# Patient Record
Sex: Female | Born: 1955 | Race: White | Hispanic: No | Marital: Married | Smoking: Never smoker
Health system: Southern US, Community
[De-identification: ages and names within clinical notes are randomized; demographics above are authoritative.]

## PROBLEM LIST (undated history)

## (undated) DIAGNOSIS — I10 Essential (primary) hypertension: Secondary | ICD-10-CM

## (undated) DIAGNOSIS — Z923 Personal history of irradiation: Secondary | ICD-10-CM

## (undated) DIAGNOSIS — B019 Varicella without complication: Secondary | ICD-10-CM

## (undated) DIAGNOSIS — K589 Irritable bowel syndrome without diarrhea: Secondary | ICD-10-CM

## (undated) DIAGNOSIS — C50212 Malignant neoplasm of upper-inner quadrant of left female breast: Principal | ICD-10-CM

## (undated) DIAGNOSIS — Z8719 Personal history of other diseases of the digestive system: Secondary | ICD-10-CM

## (undated) DIAGNOSIS — Z9221 Personal history of antineoplastic chemotherapy: Secondary | ICD-10-CM

## (undated) DIAGNOSIS — C50919 Malignant neoplasm of unspecified site of unspecified female breast: Secondary | ICD-10-CM

## (undated) DIAGNOSIS — E785 Hyperlipidemia, unspecified: Secondary | ICD-10-CM

## (undated) HISTORY — DX: Essential (primary) hypertension: I10

## (undated) HISTORY — DX: Malignant neoplasm of upper-inner quadrant of left female breast: C50.212

## (undated) HISTORY — DX: Varicella without complication: B01.9

## (undated) HISTORY — DX: Irritable bowel syndrome, unspecified: K58.9

## (undated) HISTORY — DX: Hyperlipidemia, unspecified: E78.5

---

## 1989-10-04 HISTORY — PX: LASER ABLATION OF THE CERVIX: SHX1949

## 2005-06-18 HISTORY — PX: COLONOSCOPY: SHX174

## 2005-09-12 LAB — HM COLONOSCOPY

## 2007-11-21 ENCOUNTER — Encounter: Admission: RE | Admit: 2007-11-21 | Discharge: 2007-11-21 | Payer: Self-pay | Admitting: Family Medicine

## 2007-12-08 ENCOUNTER — Encounter: Admission: RE | Admit: 2007-12-08 | Discharge: 2007-12-08 | Payer: Self-pay | Admitting: Family Medicine

## 2009-03-11 ENCOUNTER — Encounter: Admission: RE | Admit: 2009-03-11 | Discharge: 2009-03-11 | Payer: Self-pay | Admitting: Family Medicine

## 2009-03-14 ENCOUNTER — Encounter: Admission: RE | Admit: 2009-03-14 | Discharge: 2009-03-14 | Payer: Self-pay | Admitting: Family Medicine

## 2009-03-19 ENCOUNTER — Encounter: Admission: RE | Admit: 2009-03-19 | Discharge: 2009-03-19 | Payer: Self-pay | Admitting: Family Medicine

## 2009-03-19 HISTORY — PX: BREAST BIOPSY: SHX20

## 2010-07-06 ENCOUNTER — Encounter: Admission: RE | Admit: 2010-07-06 | Discharge: 2010-07-06 | Payer: Self-pay | Admitting: Family Medicine

## 2010-07-27 ENCOUNTER — Encounter: Admission: RE | Admit: 2010-07-27 | Discharge: 2010-07-27 | Payer: Self-pay | Admitting: Family Medicine

## 2010-10-24 ENCOUNTER — Encounter: Payer: Self-pay | Admitting: Family Medicine

## 2012-02-18 ENCOUNTER — Other Ambulatory Visit: Payer: Self-pay | Admitting: Physician Assistant

## 2012-02-18 ENCOUNTER — Other Ambulatory Visit: Payer: Self-pay | Admitting: Family Medicine

## 2012-02-18 DIAGNOSIS — Z1231 Encounter for screening mammogram for malignant neoplasm of breast: Secondary | ICD-10-CM

## 2012-03-15 ENCOUNTER — Ambulatory Visit
Admission: RE | Admit: 2012-03-15 | Discharge: 2012-03-15 | Disposition: A | Discharge status: Home / Self Care | Payer: BC Managed Care – PPO | Source: Ambulatory Visit | Attending: Physician Assistant | Admitting: Physician Assistant

## 2012-03-15 DIAGNOSIS — Z1231 Encounter for screening mammogram for malignant neoplasm of breast: Secondary | ICD-10-CM

## 2013-03-27 ENCOUNTER — Encounter: Payer: Self-pay | Admitting: Family Medicine

## 2013-03-27 ENCOUNTER — Ambulatory Visit (INDEPENDENT_AMBULATORY_CARE_PROVIDER_SITE_OTHER): Payer: Self-pay | Admitting: Family Medicine

## 2013-03-27 VITALS — BP 118/84 | HR 63 | Temp 98.3°F | Ht 64.75 in | Wt 178.6 lb

## 2013-03-27 DIAGNOSIS — E785 Hyperlipidemia, unspecified: Secondary | ICD-10-CM

## 2013-03-27 DIAGNOSIS — I10 Essential (primary) hypertension: Secondary | ICD-10-CM | POA: Insufficient documentation

## 2013-03-27 DIAGNOSIS — K589 Irritable bowel syndrome without diarrhea: Secondary | ICD-10-CM

## 2013-03-27 LAB — CBC WITH DIFFERENTIAL/PLATELET
Basophils Absolute: 0 10*3/uL (ref 0.0–0.1)
Basophils Relative: 0.8 % (ref 0.0–3.0)
Eosinophils Absolute: 0.1 10*3/uL (ref 0.0–0.7)
HCT: 40 % (ref 36.0–46.0)
Hemoglobin: 13.7 g/dL (ref 12.0–15.0)
Lymphs Abs: 1.5 10*3/uL (ref 0.7–4.0)
MCHC: 34.3 g/dL (ref 30.0–36.0)
Monocytes Relative: 6.8 % (ref 3.0–12.0)
Neutro Abs: 2.3 10*3/uL (ref 1.4–7.7)
RBC: 4.41 Mil/uL (ref 3.87–5.11)
RDW: 13.3 % (ref 11.5–14.6)

## 2013-03-27 LAB — LIPID PANEL
Cholesterol: 299 mg/dL — ABNORMAL HIGH (ref 0–200)
Total CHOL/HDL Ratio: 5
Triglycerides: 160 mg/dL — ABNORMAL HIGH (ref 0.0–149.0)

## 2013-03-27 LAB — TSH: TSH: 0.87 u[IU]/mL (ref 0.35–5.50)

## 2013-03-27 LAB — HEPATIC FUNCTION PANEL
ALT: 17 U/L (ref 0–35)
Bilirubin, Direct: 0.1 mg/dL (ref 0.0–0.3)
Total Protein: 7.5 g/dL (ref 6.0–8.3)

## 2013-03-27 LAB — BASIC METABOLIC PANEL
BUN: 14 mg/dL (ref 6–23)
CO2: 28 mEq/L (ref 19–32)
Calcium: 9.4 mg/dL (ref 8.4–10.5)
Creatinine, Ser: 0.7 mg/dL (ref 0.4–1.2)

## 2013-03-27 MED ORDER — DICYCLOMINE HCL 20 MG PO TABS: 20.0000 mg | ORAL_TABLET | Freq: Four times a day (QID) | ORAL | Status: DC

## 2013-03-27 NOTE — Assessment & Plan Note (Signed)
New to provider, chronic for pt.  BP elevated today but pt admits to being nervous for 1st visit.  Check labs.  Will continue to follow at future visits.

## 2013-03-27 NOTE — Patient Instructions (Addendum)
Schedule your complete physical in 6 months (we'll repeat labs at this time) We'll notify you of your lab results and make any changes if needed Start the Bentyl as needed for abdominal cramping and bloating Call with any questions or concerns Welcome!  We're glad to have you!

## 2013-03-27 NOTE — Progress Notes (Signed)
  Subjective:    Patient ID: Emily Griffith, female    DOB: [birth year], 57 y.o.   MRN: [id number]  HPI New to establish.  Previous MD- [facility], Elpidio Anis, PA-C  HTN- chronic problem, on Lisinopril HCTZ.  No CP, SOB, HAs, visual changes, edema.  Hyperlipidemia- chronic problem, previously on Pravastatin 80mg  but last took ~1 yr ago.  Exercising regularly.  IBS- chronic problem, dx'd at age 73.  Not currently on medication.  Pt feels sxs are fairly well controlled w/ diet and exercise.  sxs worsen w/ stress.  Diarrhea predominate.  + abd cramping and bloating.  Health Maintenance- UTD on pap, mammo.  Due for DEXA.  UTD on colonoscopy.   Review of Systems     Objective:   Physical Exam  Vitals reviewed. Constitutional: She is oriented to person, place, and time. She appears well-developed and well-nourished. No distress.  HENT:  Head: Normocephalic and atraumatic.  Eyes: Conjunctivae and EOM are normal. Pupils are equal, round, and reactive to light.  Neck: Normal range of motion. Neck supple. No thyromegaly present.  Cardiovascular: Normal rate, regular rhythm, normal heart sounds and intact distal pulses.   No murmur heard. Pulmonary/Chest: Effort normal and breath sounds normal. No respiratory distress.  Abdominal: Soft. She exhibits no distension. There is no tenderness.  Musculoskeletal: She exhibits no edema.  Lymphadenopathy:    She has no cervical adenopathy.  Neurological: She is alert and oriented to person, place, and time.  Skin: Skin is warm and dry.  Psychiatric: She has a normal mood and affect. Her behavior is normal.          Assessment & Plan:

## 2013-03-27 NOTE — Assessment & Plan Note (Signed)
New to provider, ongoing for pt.  Was on high dose pravastatin previously but has been off meds x1 yr.  Check labs.  Restart meds prn.  Pt expressed understanding and is in agreement w/ plan.

## 2013-03-27 NOTE — Assessment & Plan Note (Signed)
New to provider, ongoing for pt.  Reviewed lifestyle modifications- high fiber diet, increased water intake, regular exercise, stress outlet.  Will start bentyl prn.  immodium for diarrhea.

## 2013-04-10 ENCOUNTER — Other Ambulatory Visit: Payer: Self-pay | Admitting: *Deleted

## 2013-04-10 DIAGNOSIS — E785 Hyperlipidemia, unspecified: Secondary | ICD-10-CM

## 2013-04-10 MED ORDER — ATORVASTATIN CALCIUM 40 MG PO TABS: 40.0000 mg | ORAL_TABLET | Freq: Every day | ORAL | Status: DC

## 2013-04-24 ENCOUNTER — Telehealth: Payer: Self-pay | Admitting: *Deleted

## 2013-04-24 NOTE — Telephone Encounter (Signed)
Patient called to 04/24/13 in concern for her prescription that she had not received yet.  I called patient back and checked on her medication Bentyl and it has been at the [facility] since 03/27/13.

## 2013-05-29 ENCOUNTER — Encounter: Payer: Self-pay | Admitting: General Practice

## 2013-05-29 ENCOUNTER — Other Ambulatory Visit: Payer: Self-pay | Admitting: General Practice

## 2013-05-29 MED ORDER — LISINOPRIL-HYDROCHLOROTHIAZIDE 20-12.5 MG PO TABS: 1.0000 | ORAL_TABLET | Freq: Every day | ORAL | Status: DC

## 2013-07-11 ENCOUNTER — Other Ambulatory Visit: Payer: Self-pay

## 2013-07-11 DIAGNOSIS — Z1231 Encounter for screening mammogram for malignant neoplasm of breast: Secondary | ICD-10-CM

## 2013-08-13 ENCOUNTER — Ambulatory Visit
Admission: RE | Admit: 2013-08-13 | Discharge: 2013-08-13 | Disposition: A | Discharge status: Home / Self Care | Payer: Self-pay | Source: Ambulatory Visit

## 2013-08-13 DIAGNOSIS — Z1231 Encounter for screening mammogram for malignant neoplasm of breast: Secondary | ICD-10-CM

## 2013-08-14 ENCOUNTER — Other Ambulatory Visit: Payer: Self-pay | Admitting: Family Medicine

## 2013-08-14 NOTE — Telephone Encounter (Signed)
Med filled.  

## 2013-09-12 ENCOUNTER — Encounter: Payer: Self-pay | Admitting: Family Medicine

## 2013-09-12 ENCOUNTER — Ambulatory Visit (INDEPENDENT_AMBULATORY_CARE_PROVIDER_SITE_OTHER): Payer: BC Managed Care – PPO | Admitting: Family Medicine

## 2013-09-12 VITALS — BP 120/80 | HR 84 | Temp 97.7°F | Ht 64.5 in | Wt 177.2 lb

## 2013-09-12 DIAGNOSIS — Z0000 Encounter for general adult medical examination without abnormal findings: Secondary | ICD-10-CM | POA: Insufficient documentation

## 2013-09-12 MED ORDER — ATORVASTATIN CALCIUM 40 MG PO TABS: ORAL_TABLET | ORAL | Status: DC

## 2013-09-12 MED ORDER — LISINOPRIL-HYDROCHLOROTHIAZIDE 20-12.5 MG PO TABS: 1.0000 | ORAL_TABLET | Freq: Every day | ORAL | Status: DC

## 2013-09-12 NOTE — Progress Notes (Signed)
Pre visit review using our clinic review tool, if applicable. No additional management support is needed unless otherwise documented below in the visit note. 

## 2013-09-12 NOTE — Progress Notes (Signed)
   Subjective:    Patient ID: Emily Griffith, female    DOB: 02-27-1956, 57 y.o.   MRN: [id number]  HPI CPE- UTD on pap (2012), mammo (2014), colonoscopy (2006).  No concerns today.   Review of Systems Patient reports no vision/ hearing changes, adenopathy,fever, weight change,  persistant/recurrent hoarseness , swallowing issues, chest pain, palpitations, edema, persistant/recurrent cough, hemoptysis, dyspnea (rest/exertional/paroxysmal nocturnal), gastrointestinal bleeding (melena, rectal bleeding), abdominal pain, significant heartburn, bowel changes, GU symptoms (dysuria, hematuria, incontinence), Gyn symptoms (abnormal  bleeding, pain),  syncope, focal weakness, memory loss, numbness & tingling, skin/hair/nail changes, abnormal bruising or bleeding, anxiety, or depression.     Objective:   Physical Exam General Appearance:    Alert, cooperative, no distress, appears stated age  Head:    Normocephalic, without obvious abnormality, atraumatic  Eyes:    PERRL, conjunctiva/corneas clear, EOM's intact, fundi    benign, both eyes  Ears:    Normal TM's and external ear canals, both ears  Nose:   Nares normal, septum midline, mucosa normal, no drainage    or sinus tenderness  Throat:   Lips, mucosa, and tongue normal; teeth and gums normal  Neck:   Supple, symmetrical, trachea midline, no adenopathy;    Thyroid: no enlargement/tenderness/nodules  Back:     Symmetric, no curvature, ROM normal, no CVA tenderness  Lungs:     Clear to auscultation bilaterally, respirations unlabored  Chest Wall:    No tenderness or deformity   Heart:    Regular rate and rhythm, S1 and S2 normal, no murmur, rub   or gallop  Breast Exam:    Deferred to mammo  Abdomen:     Soft, non-tender, bowel sounds active all four quadrants,    no masses, no organomegaly  Genitalia:    Deferred to GYN  Rectal:    Extremities:   Extremities normal, atraumatic, no cyanosis or edema  Pulses:   2+ and symmetric all  extremities  Skin:   Skin color, texture, turgor normal, no rashes or lesions  Lymph nodes:   Cervical, supraclavicular, and axillary nodes normal  Neurologic:   CNII-XII intact, normal strength, sensation and reflexes    throughout          Assessment & Plan:

## 2013-09-12 NOTE — Patient Instructions (Signed)
Follow up in 6 months to recheck BP and cholesterol We'll notify you of your lab results and make any changes if needed Keep up the good work!  You look great!! Call with any questions or concerns Happy Holidays!!! 

## 2013-09-12 NOTE — Assessment & Plan Note (Signed)
Pt's PE WNL.  EKG done as baseline.  UTD on pap, mammo, colonoscopy.  Check labs.  Anticipatory guidance provided.

## 2013-09-13 LAB — CBC WITH DIFFERENTIAL/PLATELET
Basophils Absolute: 0 10*3/uL (ref 0.0–0.1)
Basophils Relative: 0.6 % (ref 0.0–3.0)
Eosinophils Absolute: 0.1 10*3/uL (ref 0.0–0.7)
Eosinophils Relative: 1.3 % (ref 0.0–5.0)
HCT: 41.7 % (ref 36.0–46.0)
Hemoglobin: 14.1 g/dL (ref 12.0–15.0)
MCV: 90.2 fl (ref 78.0–100.0)
Monocytes Relative: 8.1 % (ref 3.0–12.0)
Neutro Abs: 3.7 10*3/uL (ref 1.4–7.7)
Platelets: 315 10*3/uL (ref 150.0–400.0)
RBC: 4.63 Mil/uL (ref 3.87–5.11)
WBC: 6.5 10*3/uL (ref 4.5–10.5)

## 2013-09-13 LAB — LIPID PANEL
Cholesterol: 253 mg/dL — ABNORMAL HIGH (ref 0–200)
HDL: 59.1 mg/dL (ref >39.00)
Total CHOL/HDL Ratio: 4
Triglycerides: 75 mg/dL (ref 0.0–149.0)

## 2013-09-13 LAB — BASIC METABOLIC PANEL
Chloride: 97 mEq/L (ref 96–112)
Potassium: 3.1 mEq/L — ABNORMAL LOW (ref 3.5–5.1)

## 2013-09-13 LAB — HEPATIC FUNCTION PANEL
Alkaline Phosphatase: 55 U/L (ref 39–117)
Bilirubin, Direct: 0.2 mg/dL (ref 0.0–0.3)
Total Bilirubin: 1.2 mg/dL (ref 0.3–1.2)
Total Protein: 8 g/dL (ref 6.0–8.3)

## 2013-09-13 LAB — TSH: TSH: 0.66 u[IU]/mL (ref 0.35–5.50)

## 2013-09-13 LAB — LDL CHOLESTEROL, DIRECT: Direct LDL: 184 mg/dL

## 2013-09-14 ENCOUNTER — Other Ambulatory Visit: Payer: Self-pay | Admitting: General Practice

## 2013-09-14 MED ORDER — POTASSIUM CHLORIDE CRYS ER 20 MEQ PO TBCR: 20.0000 meq | EXTENDED_RELEASE_TABLET | Freq: Every day | ORAL | Status: DC

## 2013-09-15 LAB — VITAMIN D 1,25 DIHYDROXY: Vitamin D 1, 25 (OH)2 Total: 64 pg/mL (ref 18–72)

## 2013-09-17 ENCOUNTER — Encounter: Payer: Self-pay | Admitting: General Practice

## 2013-09-19 ENCOUNTER — Other Ambulatory Visit: Payer: Self-pay | Admitting: General Practice

## 2013-09-19 MED ORDER — ATORVASTATIN CALCIUM 40 MG PO TABS: ORAL_TABLET | ORAL | Status: DC

## 2013-09-19 MED ORDER — LISINOPRIL-HYDROCHLOROTHIAZIDE 20-12.5 MG PO TABS: 1.0000 | ORAL_TABLET | Freq: Every day | ORAL | Status: DC

## 2014-02-11 ENCOUNTER — Encounter: Payer: Self-pay | Admitting: Family Medicine

## 2014-02-11 ENCOUNTER — Ambulatory Visit (INDEPENDENT_AMBULATORY_CARE_PROVIDER_SITE_OTHER): Payer: BC Managed Care – PPO | Admitting: Family Medicine

## 2014-02-11 VITALS — BP 160/100 | HR 103 | Temp 98.2°F | Resp 16

## 2014-02-11 DIAGNOSIS — S93409A Sprain of unspecified ligament of unspecified ankle, initial encounter: Secondary | ICD-10-CM

## 2014-02-11 DIAGNOSIS — S93402A Sprain of unspecified ligament of left ankle, initial encounter: Secondary | ICD-10-CM | POA: Insufficient documentation

## 2014-02-11 NOTE — Patient Instructions (Signed)
Follow up as needed We'll notify you of your ortho appt Take the Hydrocodone as needed for pain ICE! Call with any questions or concerns Hang in there!!!

## 2014-02-11 NOTE — Progress Notes (Signed)
Pre visit review using our clinic review tool, if applicable. No additional management support is needed unless otherwise documented below in the visit note. 

## 2014-02-11 NOTE — Progress Notes (Signed)
   Subjective:    Patient ID: Emily Griffith, female    DOB: 04-Jun-1956, 58 y.o.   MRN: [id number]  HPI L ankle injury- occurred while golfing in [place] on 5/8 , rolled ankle when stepped in overgrown hole.  Pt had inversion injury.  Went to [facility] 5/10 and had xrays which showed no break.  Bought OTC ankle wrap on Saturday, started using crutches b/c she is unable to weight bear w/o significant pain.  Was given vicodin for pain.  + swelling and bruising of ankle laterally.   Review of Systems For ROS see HPI     Objective:   Physical Exam  Vitals reviewed. Constitutional: She appears well-developed and well-nourished. No distress.  Cardiovascular: Intact distal pulses.   Musculoskeletal: She exhibits edema (marked edema of L foot and ankle laterally) and tenderness (over L ATF, CFL, PTF).  Good dorsiflexion, pain w/ plantarflexion, pt unable to invert or evert w/o considerable pain.  Skin: Skin is warm and dry.          Assessment & Plan:

## 2014-02-11 NOTE — Assessment & Plan Note (Signed)
New.  Based on pt's severe tenderness over all 3 ligaments, suspect all 3 are torn.  Given severity of sprain, will refer to ortho for ongoing management and rehabilitation.  Pt to continue hydrocodone for pain.  No weight bearing at this time.  Will follow.

## 2014-03-13 ENCOUNTER — Ambulatory Visit: Payer: BC Managed Care – PPO | Admitting: Family Medicine

## 2014-03-18 ENCOUNTER — Ambulatory Visit (INDEPENDENT_AMBULATORY_CARE_PROVIDER_SITE_OTHER): Payer: BC Managed Care – PPO | Admitting: Family Medicine

## 2014-03-18 ENCOUNTER — Encounter: Payer: Self-pay | Admitting: Family Medicine

## 2014-03-18 VITALS — BP 126/82 | HR 87 | Temp 98.0°F | Resp 16 | Wt 176.0 lb

## 2014-03-18 DIAGNOSIS — E785 Hyperlipidemia, unspecified: Secondary | ICD-10-CM

## 2014-03-18 DIAGNOSIS — I10 Essential (primary) hypertension: Secondary | ICD-10-CM

## 2014-03-18 NOTE — Progress Notes (Signed)
   Subjective:    Patient ID: Emily Griffith, female    DOB: 1956/04/06, 58 y.o.   MRN: [id number]  HPI HTN- chronic problem, well controlled on Lisinopril HCTZ.  No CP, SOB, HAs, visual changes, edema.  Hyperlipidemia- chronic problem, on on Lipitor.  No abd pain, N/V, myalgias.   Review of Systems For ROS see HPI     Objective:   Physical Exam  Vitals reviewed. Constitutional: She is oriented to person, place, and time. She appears well-developed and well-nourished. No distress.  HENT:  Head: Normocephalic and atraumatic.  Eyes: Conjunctivae and EOM are normal. Pupils are equal, round, and reactive to light.  Neck: Normal range of motion. Neck supple. No thyromegaly present.  Cardiovascular: Normal rate, regular rhythm, normal heart sounds and intact distal pulses.   No murmur heard. Pulmonary/Chest: Effort normal and breath sounds normal. No respiratory distress.  Abdominal: Soft. She exhibits no distension. There is no tenderness.  Musculoskeletal: She exhibits no edema.  Lymphadenopathy:    She has no cervical adenopathy.  Neurological: She is alert and oriented to person, place, and time.  Skin: Skin is warm and dry.  Psychiatric: She has a normal mood and affect. Her behavior is normal.          Assessment & Plan:

## 2014-03-18 NOTE — Patient Instructions (Signed)
Schedule your complete physical in 6 months We'll notify you of your lab results and make any changes if needed Keep up the good work!  You look great!!! Good luck on the healing- hang in there!!!

## 2014-03-18 NOTE — Progress Notes (Signed)
Pre visit review using our clinic review tool, if applicable. No additional management support is needed unless otherwise documented below in the visit note. 

## 2014-03-19 ENCOUNTER — Telehealth: Payer: Self-pay | Admitting: Family Medicine

## 2014-03-19 ENCOUNTER — Encounter: Payer: Self-pay | Admitting: General Practice

## 2014-03-19 LAB — LIPID PANEL
CHOL/HDL RATIO: 4
Cholesterol: 199 mg/dL (ref 0–200)
HDL: 55.1 mg/dL (ref >39.00)
LDL Cholesterol: 114 mg/dL — ABNORMAL HIGH (ref 0–99)
NonHDL: 143.9
Triglycerides: 148 mg/dL (ref 0.0–149.0)
VLDL: 29.6 mg/dL (ref 0.0–40.0)

## 2014-03-19 LAB — BASIC METABOLIC PANEL
BUN: 12 mg/dL (ref 6–23)
CHLORIDE: 100 meq/L (ref 96–112)
CO2: 28 mEq/L (ref 19–32)
Calcium: 9.8 mg/dL (ref 8.4–10.5)
Creatinine, Ser: 0.7 mg/dL (ref 0.4–1.2)
GFR: 89.43 mL/min (ref >60.00)
Glucose, Bld: 87 mg/dL (ref 70–99)
POTASSIUM: 3.6 meq/L (ref 3.5–5.1)
SODIUM: 136 meq/L (ref 135–145)

## 2014-03-19 LAB — HEPATIC FUNCTION PANEL
ALK PHOS: 56 U/L (ref 39–117)
ALT: 19 U/L (ref 0–35)
AST: 20 U/L (ref 0–37)
Albumin: 4.2 g/dL (ref 3.5–5.2)
BILIRUBIN DIRECT: 0.1 mg/dL (ref 0.0–0.3)
BILIRUBIN TOTAL: 0.9 mg/dL (ref 0.2–1.2)
Total Protein: 7.8 g/dL (ref 6.0–8.3)

## 2014-03-19 NOTE — Telephone Encounter (Signed)
Relevant patient education assigned to patient using Emmi. ° °

## 2014-03-21 NOTE — Assessment & Plan Note (Signed)
Chronic problem, tolerating statin w/o difficulty.  Check labs.  Adjust meds prn  

## 2014-03-21 NOTE — Assessment & Plan Note (Signed)
Chronic problem, well controlled.  Asymptomatic.  Check labs.  No anticipated med changes. °

## 2014-04-02 ENCOUNTER — Other Ambulatory Visit: Payer: Self-pay | Admitting: Family Medicine

## 2014-04-02 NOTE — Telephone Encounter (Signed)
Med filled.  

## 2014-05-31 ENCOUNTER — Other Ambulatory Visit: Payer: Self-pay | Admitting: Family Medicine

## 2014-05-31 NOTE — Telephone Encounter (Signed)
Med filled.  

## 2014-07-01 ENCOUNTER — Telehealth: Payer: Self-pay | Admitting: Family Medicine

## 2014-07-01 NOTE — Telephone Encounter (Signed)
Prednisone will not interact w/ other meds

## 2014-07-01 NOTE — Telephone Encounter (Signed)
Called and notified pt

## 2014-07-01 NOTE — Telephone Encounter (Signed)
Caller name: Quandra  Relation to pt: self  Call back number: [phone]   Reason for call:  pt would like to discuss Prednisone  medication her orthopedic prescribed would like to know if it clash with any other medication

## 2014-07-03 ENCOUNTER — Telehealth: Payer: Self-pay | Admitting: Family Medicine

## 2014-07-03 NOTE — Telephone Encounter (Signed)
Caller name:Brendel, Baker Janus Relation to EY:CXKG Call back number:[phone] Pharmacy:  Reason for call: pt states she is on day 5 of prednisone, and her job is offering flu shots today, would like to know if she can get the shot today or should she wait. Also wants to inform dr. Birdie Riddle that she is scheduled for a MRI on her ankle on Sunday.

## 2014-07-03 NOTE — Telephone Encounter (Signed)
Pt notified that per [facility] she should wait at least one week after her last prednisone tablet to receive shot, that way it does not affect it.

## 2014-07-19 ENCOUNTER — Ambulatory Visit (INDEPENDENT_AMBULATORY_CARE_PROVIDER_SITE_OTHER): Payer: BC Managed Care – PPO

## 2014-07-19 DIAGNOSIS — Z23 Encounter for immunization: Secondary | ICD-10-CM

## 2014-09-20 ENCOUNTER — Encounter: Payer: BC Managed Care – PPO | Admitting: Family Medicine

## 2014-10-14 ENCOUNTER — Other Ambulatory Visit: Payer: Self-pay | Admitting: Family Medicine

## 2014-10-14 NOTE — Telephone Encounter (Signed)
Med filled and letter mailed to pt to schedule her CPE.

## 2014-11-07 ENCOUNTER — Other Ambulatory Visit: Payer: Self-pay

## 2014-11-07 DIAGNOSIS — Z1231 Encounter for screening mammogram for malignant neoplasm of breast: Secondary | ICD-10-CM

## 2014-11-14 ENCOUNTER — Encounter: Payer: Self-pay | Admitting: *Deleted

## 2014-11-14 ENCOUNTER — Telehealth: Payer: Self-pay | Admitting: *Deleted

## 2014-11-14 NOTE — Telephone Encounter (Signed)
Pre-Visit Call:   Reviewed allergies, medications, health history, and health maintenance with patient and made changes as appropriate.   Preferred pharmacy:  [facility], [place]  Pap- patient reports 10/04/10 with Dr. Pearline Cables, would like at [facility]- 09/13/15 per patient in [place], [place]- 08/13/13 with Curlene Dolphin, MD at [facility] (has apt for another on 2/19)  Flu- 07/19/14 Td- 09/13/07 [place]- would like at appt  Concerns: patient having congestions and cough x 2 weeks, feels that it is worse after eating

## 2014-11-15 ENCOUNTER — Ambulatory Visit (INDEPENDENT_AMBULATORY_CARE_PROVIDER_SITE_OTHER): Payer: BLUE CROSS/BLUE SHIELD | Admitting: Family Medicine

## 2014-11-15 ENCOUNTER — Other Ambulatory Visit (HOSPITAL_COMMUNITY)
Admission: RE | Admit: 2014-11-15 | Discharge: 2014-11-15 | Disposition: A | Discharge status: Home / Self Care | Payer: BLUE CROSS/BLUE SHIELD | Source: Ambulatory Visit | Attending: Family Medicine | Admitting: Family Medicine

## 2014-11-15 ENCOUNTER — Encounter: Payer: Self-pay | Admitting: Family Medicine

## 2014-11-15 VITALS — BP 120/78 | HR 81 | Temp 98.1°F | Resp 16 | Ht 65.0 in | Wt 175.5 lb

## 2014-11-15 DIAGNOSIS — Z0000 Encounter for general adult medical examination without abnormal findings: Secondary | ICD-10-CM

## 2014-11-15 DIAGNOSIS — Z23 Encounter for immunization: Secondary | ICD-10-CM

## 2014-11-15 DIAGNOSIS — Z1151 Encounter for screening for human papillomavirus (HPV): Secondary | ICD-10-CM | POA: Diagnosis present

## 2014-11-15 DIAGNOSIS — Z01419 Encounter for gynecological examination (general) (routine) without abnormal findings: Secondary | ICD-10-CM | POA: Insufficient documentation

## 2014-11-15 DIAGNOSIS — Z1211 Encounter for screening for malignant neoplasm of colon: Secondary | ICD-10-CM

## 2014-11-15 DIAGNOSIS — Z124 Encounter for screening for malignant neoplasm of cervix: Secondary | ICD-10-CM

## 2014-11-15 LAB — BASIC METABOLIC PANEL
BUN: 19 mg/dL (ref 6–23)
CALCIUM: 9.8 mg/dL (ref 8.4–10.5)
CO2: 31 mEq/L (ref 19–32)
Chloride: 101 mEq/L (ref 96–112)
Creatinine, Ser: 0.75 mg/dL (ref 0.40–1.20)
GFR: 83.76 mL/min (ref >60.00)
GLUCOSE: 97 mg/dL (ref 70–99)
Potassium: 3.7 mEq/L (ref 3.5–5.1)
Sodium: 138 mEq/L (ref 135–145)

## 2014-11-15 LAB — HEPATIC FUNCTION PANEL
ALBUMIN: 4.2 g/dL (ref 3.5–5.2)
ALK PHOS: 64 U/L (ref 39–117)
ALT: 25 U/L (ref 0–35)
AST: 20 U/L (ref 0–37)
BILIRUBIN DIRECT: 0.2 mg/dL (ref 0.0–0.3)
TOTAL PROTEIN: 7.7 g/dL (ref 6.0–8.3)
Total Bilirubin: 0.9 mg/dL (ref 0.2–1.2)

## 2014-11-15 LAB — LIPID PANEL
Cholesterol: 197 mg/dL (ref 0–200)
HDL: 52.6 mg/dL (ref >39.00)
LDL Cholesterol: 120 mg/dL — ABNORMAL HIGH (ref 0–99)
NonHDL: 144.4
Total CHOL/HDL Ratio: 4
Triglycerides: 123 mg/dL (ref 0.0–149.0)
VLDL: 24.6 mg/dL (ref 0.0–40.0)

## 2014-11-15 LAB — CBC WITH DIFFERENTIAL/PLATELET
BASOS ABS: 0 10*3/uL (ref 0.0–0.1)
Basophils Relative: 0.7 % (ref 0.0–3.0)
EOS PCT: 1.6 % (ref 0.0–5.0)
Eosinophils Absolute: 0.1 10*3/uL (ref 0.0–0.7)
HEMATOCRIT: 41.5 % (ref 36.0–46.0)
Hemoglobin: 14.2 g/dL (ref 12.0–15.0)
LYMPHS ABS: 1.7 10*3/uL (ref 0.7–4.0)
LYMPHS PCT: 31.3 % (ref 12.0–46.0)
MCHC: 34.2 g/dL (ref 30.0–36.0)
MCV: 89.3 fl (ref 78.0–100.0)
MONOS PCT: 6.2 % (ref 3.0–12.0)
Monocytes Absolute: 0.3 10*3/uL (ref 0.1–1.0)
NEUTROS PCT: 60.2 % (ref 43.0–77.0)
Neutro Abs: 3.4 10*3/uL (ref 1.4–7.7)
PLATELETS: 312 10*3/uL (ref 150.0–400.0)
RBC: 4.65 Mil/uL (ref 3.87–5.11)
RDW: 13 % (ref 11.5–15.5)
WBC: 5.6 10*3/uL (ref 4.0–10.5)

## 2014-11-15 LAB — VITAMIN D 25 HYDROXY (VIT D DEFICIENCY, FRACTURES): VITD: 18.42 ng/mL — ABNORMAL LOW (ref 30.00–100.00)

## 2014-11-15 LAB — TSH: TSH: 0.96 u[IU]/mL (ref 0.35–4.50)

## 2014-11-15 NOTE — Assessment & Plan Note (Signed)
Pt's PE WNL.  Has mammo scheduled for next week.  Due for repeat colonoscopy- GI referral entered.  Pap collected. Today.  Shingles vaccine given.  Check labs.  Anticipatory guidance provided.

## 2014-11-15 NOTE — Progress Notes (Signed)
   Subjective:    Patient ID: Emily Griffith, female    DOB: 10/05/1955, 59 y.o.   MRN: [id number]  HPI CPE- due for pap, mammo (scheduled for next week), colonoscopy this year.     Review of Systems Patient reports no vision/ hearing changes, adenopathy,fever, weight change,  persistant/recurrent hoarseness , swallowing issues, chest pain, palpitations, edema, persistant/recurrent cough, hemoptysis, dyspnea (rest/exertional/paroxysmal nocturnal), gastrointestinal bleeding (melena, rectal bleeding), abdominal pain, significant heartburn, bowel changes, GU symptoms (dysuria, hematuria, incontinence), Gyn symptoms (abnormal  bleeding, pain),  syncope, focal weakness, memory loss, numbness & tingling, skin/hair/nail changes, abnormal bruising or bleeding, anxiety, or depression.     Objective:   Physical Exam  General Appearance:    Alert, cooperative, no distress, appears stated age  Head:    Normocephalic, without obvious abnormality, atraumatic  Eyes:    PERRL, conjunctiva/corneas clear, EOM's intact, fundi    benign, both eyes  Ears:    Normal TM's and external ear canals, both ears  Nose:   Nares normal, septum midline, mucosa normal, no drainage    or sinus tenderness  Throat:   Lips, mucosa, and tongue normal; teeth and gums normal  Neck:   Supple, symmetrical, trachea midline, no adenopathy;    Thyroid: no enlargement/tenderness/nodules  Back:     Symmetric, no curvature, ROM normal, no CVA tenderness  Lungs:     Clear to auscultation bilaterally, respirations unlabored  Chest Wall:    No tenderness or deformity   Heart:    Regular rate and rhythm, S1 and S2 normal, no murmur, rub   or gallop  Breast Exam:    No tenderness, masses, or nipple abnormality.  + fibrocystic breasts, R>L  Abdomen:     Soft, non-tender, bowel sounds active all four quadrants,    no masses, no organomegaly  Genitalia:    External genitalia normal, cervix normal in appearance, no CMT, uterus in normal  size and position, adnexa w/out mass or tenderness, mucosa pink and moist, no lesions or discharge present  Rectal:    Normal external appearance  Extremities:   Extremities normal, atraumatic, no cyanosis or edema  Pulses:   2+ and symmetric all extremities  Skin:   Skin color, texture, turgor normal, no rashes or lesions  Lymph nodes:   Cervical, supraclavicular, and axillary nodes normal  Neurologic:   CNII-XII intact, normal strength, sensation and reflexes    throughout          Assessment & Plan:

## 2014-11-15 NOTE — Progress Notes (Signed)
Pre visit review using our clinic review tool, if applicable. No additional management support is needed unless otherwise documented below in the visit note. 

## 2014-11-15 NOTE — Assessment & Plan Note (Signed)
Pap collected. 

## 2014-11-15 NOTE — Patient Instructions (Signed)
Follow up in 6 months to recheck BP and cholesterol We'll notify you of your lab results and make any changes if needed Keep up the good work on healthy diet and regular exercise- you look great! We'll call you with your GI appt for the colonoscopy- you can also discuss your hemorrhoids at that time Call with any questions or concerns Happy Valentine's Day!!

## 2014-11-18 ENCOUNTER — Other Ambulatory Visit: Payer: Self-pay | Admitting: General Practice

## 2014-11-18 LAB — CYTOLOGY - PAP

## 2014-11-18 MED ORDER — VITAMIN D (ERGOCALCIFEROL) 1.25 MG (50000 UNIT) PO CAPS: 50000.0000 [IU] | ORAL_CAPSULE | ORAL | Status: DC

## 2014-11-22 ENCOUNTER — Ambulatory Visit
Admission: RE | Admit: 2014-11-22 | Discharge: 2014-11-22 | Disposition: A | Discharge status: Home / Self Care | Payer: BLUE CROSS/BLUE SHIELD | Source: Ambulatory Visit

## 2014-11-22 DIAGNOSIS — Z1231 Encounter for screening mammogram for malignant neoplasm of breast: Secondary | ICD-10-CM

## 2014-11-26 ENCOUNTER — Other Ambulatory Visit: Payer: Self-pay | Admitting: Family Medicine

## 2014-11-26 DIAGNOSIS — R928 Other abnormal and inconclusive findings on diagnostic imaging of breast: Secondary | ICD-10-CM

## 2014-12-06 ENCOUNTER — Other Ambulatory Visit: Payer: Self-pay | Admitting: Family Medicine

## 2014-12-06 DIAGNOSIS — R928 Other abnormal and inconclusive findings on diagnostic imaging of breast: Secondary | ICD-10-CM

## 2014-12-09 ENCOUNTER — Ambulatory Visit
Admission: RE | Admit: 2014-12-09 | Discharge: 2014-12-09 | Disposition: A | Discharge status: Home / Self Care | Payer: BLUE CROSS/BLUE SHIELD | Source: Ambulatory Visit | Attending: Family Medicine | Admitting: Family Medicine

## 2014-12-09 ENCOUNTER — Other Ambulatory Visit: Payer: Self-pay | Admitting: Family Medicine

## 2014-12-09 DIAGNOSIS — R928 Other abnormal and inconclusive findings on diagnostic imaging of breast: Secondary | ICD-10-CM

## 2014-12-09 DIAGNOSIS — N6320 Unspecified lump in the left breast, unspecified quadrant: Secondary | ICD-10-CM

## 2014-12-13 ENCOUNTER — Other Ambulatory Visit: Payer: Self-pay | Admitting: Family Medicine

## 2014-12-13 ENCOUNTER — Ambulatory Visit
Admission: RE | Admit: 2014-12-13 | Discharge: 2014-12-13 | Disposition: A | Discharge status: Home / Self Care | Payer: BLUE CROSS/BLUE SHIELD | Source: Ambulatory Visit | Attending: Family Medicine | Admitting: Family Medicine

## 2014-12-13 DIAGNOSIS — N6320 Unspecified lump in the left breast, unspecified quadrant: Secondary | ICD-10-CM

## 2014-12-13 HISTORY — PX: BREAST BIOPSY: SHX20

## 2014-12-18 ENCOUNTER — Other Ambulatory Visit: Payer: Self-pay | Admitting: Family Medicine

## 2014-12-18 DIAGNOSIS — C50912 Malignant neoplasm of unspecified site of left female breast: Secondary | ICD-10-CM

## 2014-12-20 ENCOUNTER — Telehealth: Payer: Self-pay | Admitting: *Deleted

## 2014-12-20 ENCOUNTER — Encounter: Payer: Self-pay | Admitting: *Deleted

## 2014-12-20 DIAGNOSIS — C50212 Malignant neoplasm of upper-inner quadrant of left female breast: Secondary | ICD-10-CM

## 2014-12-20 HISTORY — DX: Malignant neoplasm of upper-inner quadrant of left female breast: C50.212

## 2014-12-20 NOTE — Telephone Encounter (Signed)
Confirmed [facility] for 12/25/14 at 0830 .  Instructions and contact information given.

## 2014-12-23 ENCOUNTER — Ambulatory Visit
Admission: RE | Admit: 2014-12-23 | Discharge: 2014-12-23 | Disposition: A | Discharge status: Home / Self Care | Payer: BLUE CROSS/BLUE SHIELD | Source: Ambulatory Visit | Attending: Family Medicine | Admitting: Family Medicine

## 2014-12-23 ENCOUNTER — Encounter: Payer: BC Managed Care – PPO | Admitting: Family Medicine

## 2014-12-23 DIAGNOSIS — C50912 Malignant neoplasm of unspecified site of left female breast: Secondary | ICD-10-CM

## 2014-12-23 MED ORDER — GADOBENATE DIMEGLUMINE 529 MG/ML IV SOLN
16.0000 mL | Freq: Once | INTRAVENOUS | Status: AC | PRN
  Administered 2014-12-23: 16 mL via INTRAVENOUS

## 2014-12-24 NOTE — Progress Notes (Signed)
[place]  [phone]) [phone] Fax:([phone]) [place] Note   Patient Care Team: Midge Minium, MD as PCP - General (Family Medicine) Fanny Skates, MD as Consulting Physician (General Surgery) Truitt Merle, MD as Consulting Physician (Hematology) Thea Silversmith, MD as Consulting Physician (Radiation Oncology) Rockwell Germany, RN as Registered Nurse Mauro Kaufmann, RN as Registered Nurse Holley Bouche, NP as Nurse Practitioner (Nurse Practitioner) 12/26/2014  CHIEF COMPLAINTS/PURPOSE OF CONSULTATION:  Newly diagnosed breast cancer  Oncology History   Breast cancer of upper-inner quadrant of left female breast   Staging form: Breast, AJCC 7th Edition     Clinical stage from 12/25/2014: Stage IIB (T3, N0, M0) - Unsigned       Staging comments: Staged at breast conference on 3.23.16        Breast cancer of upper-inner quadrant of left female breast   12/09/2014 Breast US ultrasound is performed, showing a diffuse, large, irregular, hypoechoic mass with shadowing located within the superior left breast extending from the 9:30 o'clock position to approximately the 2:30 o'clock position and centered at approximately the    12/13/2014 Pathology Results Breast, left, needle core biopsy, 11:30 o'clock - INVASIVE MAMMARY CARCINOMA, SEE COMMENT. - MAMMARY CARCINOMA IN SITU. 2. Breast, left, needle core biopsy, 9:30 o'clock - INVASIVE MAMMARY CARCINOMA, SEE COMMENT   12/13/2014 Receptors her2 Estrogen Receptor: 99%, POSITIVE, STRONG STAINING INTENSITY Progesterone Receptor: 43%, POSITIVE, MODERATE STAINING INTENSITY Proliferation Marker Ki67: 42%  HER-2/NEU BY CISH - NEGATIVE   12/20/2014 Initial Diagnosis Breast cancer of upper-inner quadrant of left female breast   12/23/2014 Breast MRI there is extensive abnormal enhancement throughout much of the left breast suspicious for additional areas of malignancy. Overall area of this abnormal enhancement covers  8.5 cm x 7 cm x 7.1 cm. Most discrete area of abnormal enhancement lies in the up     HISTORY OF PRESENTING ILLNESS:  Emily Griffith 59 y.o. female is here because of newly diagnosed left breast cancer.  This was found by screening mammogram. Her prior screening mammogram was in November 2014 which was negative. Her screening mammogram on 11/25/2014 showed a possible mass and distortion in the left breast. She further underwent diagnostic mammogram and ultrasound on 12/09/2014, which showed a diffuse large irregular hypoechogenic mass extending from 9:30 o'clock position 2-30 o'clock position. Biopsy of this large mass at 2 different positions showed invasive lobular carcinoma.  She denies any symptoms. She feels well overall. She denies any pain, cough, dyspnea, or any GI symptoms. 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 No recent weight loss. No change of her appetite.   She had right breast biopsy was benign. She always has lumpy breast which has not changed per patient. She has IBS, she has dirrhea which is usually triggered by stress, and she takes Imodium as needed.    MEDICAL HISTORY:  Past Medical History  Diagnosis Date  . Chicken pox   . Hypertension   . Hyperlipidemia   . IBS (irritable bowel syndrome)   . Breast cancer of upper-inner quadrant of left female breast 12/20/2014    SURGICAL HISTORY: Past Surgical History  Procedure Laterality Date  . Laser ablation of the cervix  1991    SOCIAL HISTORY: History   Social History  . Marital Status: Married    Spouse Name: N/A  . Number of Children: 0  . Years of Education: N/A   Occupational  History  . 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 Smoking status: Never Smoker   . Smokeless tobacco: Not on file  . Alcohol Use: Yes     Comment: social   . Drug Use: No  . Sexual Activity: Not on file   Other Topics Concern  . Not on file    Social History Narrative   GYN HISTORY  Menarchal: 11 LMP: 03/2013  Contraceptive: no HRT: no  G0P0:    FAMILY HISTORY: Family History  Problem Relation Age of Onset  . Hypertension Mother   . Sudden death Father 30    bleeding   . Cancer Maternal Uncle 60    unknown cancer   . Cancer Paternal Uncle 8    unknown cancer     ALLERGIES:  has No Known Allergies.  MEDICATIONS:  Current Outpatient Prescriptions  Medication Sig Dispense Refill  . atorvastatin (LIPITOR) 40 MG tablet TAKE 1 TABLET EVERY NIGHT AT BEDTIME 90 tablet 0  . dicyclomine (BENTYL) 20 MG tablet TAKE 1 TABLET BY MOUTH EVERY 6 HOURS (Patient taking differently: TAKE 1 TABLET BY MOUTH EVERY 6 HOURS AS NEEDED) 60 tablet 3  . lisinopril-hydrochlorothiazide (PRINZIDE,ZESTORETIC) 20-12.5 MG per tablet TAKE 1 TABLET DAILY 90 tablet 0  . potassium chloride SA (K-DUR,KLOR-CON) 20 MEQ tablet Take 1 tablet (20 mEq total) by mouth daily. 90 tablet 3  . Vitamin D, Ergocalciferol, (DRISDOL) 50000 UNITS CAPS capsule Take 1 capsule (50,000 Units total) by mouth every 7 (seven) days. 12 capsule 0   No current facility-administered medications for this visit.    REVIEW OF SYSTEMS:   Constitutional: Denies fevers, chills or abnormal night sweats Eyes: Denies blurriness of vision, double vision or watery eyes Ears, nose, mouth, throat, and face: Denies mucositis or sore throat Respiratory: Denies cough, dyspnea or wheezes Cardiovascular: Denies palpitation, chest discomfort or lower extremity swelling Gastrointestinal:  Denies nausea, heartburn or change in bowel habits Skin: Denies abnormal skin rashes Lymphatics: Denies new lymphadenopathy or easy bruising Neurological:Denies numbness, tingling or new weaknesses Behavioral/Psych: Mood is stable, no new changes  All other systems were reviewed with the patient and are negative.  PHYSICAL EXAMINATION: ECOG PERFORMANCE STATUS: 0 - Asymptomatic  Filed Vitals:    12/25/14 0925  BP: 131/83  Pulse: 81  Temp: 97.7 F (36.5 C)  Resp: 18   Filed Weights   12/25/14 0925  Weight: 175 lb (79.379 kg)    GENERAL:alert, no distress and comfortable SKIN: skin color, texture, turgor are normal, no rashes or significant lesions EYES: normal, conjunctiva are pink and non-injected, sclera clear OROPHARYNX:no exudate, no erythema and lips, buccal mucosa, and tongue normal  NECK: supple, thyroid normal size, non-tender, without nodularity LYMPH:  no palpable lymphadenopathy in the cervical, axillary or inguinal LUNGS: clear to auscultation and percussion with normal breathing effort HEART: regular rate & rhythm and no murmurs and no lower extremity edema ABDOMEN:abdomen soft, non-tender and normal bowel sounds Musculoskeletal:no cyanosis of digits and no clubbing  PSYCH: alert & oriented x 3 with fluent speech NEURO: no focal motor/sensory deficits Breasts: Breast inspection showed them to be symmetrical with no nipple discharge. (+) Skin bruise at the biopsy sites of left breast. Palpation of the left breasts she would all firm fullness at the left upper quadrant breast, which has not changed per patient for use, palpitation of the right breast and axilla revealed no obvious mass that I could appreciate.   LABORATORY DATA:  I have reviewed the data as listed Lab Results  Component Value Date   WBC 5.8 12/25/2014   HGB 14.0 12/25/2014   HCT 42.2 12/25/2014   MCV 89.5 12/25/2014   PLT 338 12/25/2014    Recent Labs  03/18/14 1504 11/15/14 1151 12/25/14 0838  NA 136 138 139  K 3.6 3.7 3.6  CL 100 101  --   CO2 _0 GLUCOSE 87 97 113  BUN 12 19 10.6  CREATININE 0.7 0.75 0.8  CALCIUM 9.8 9.8 9.8  PROT 7.8 7.7 7.6  ALBUMIN 4.2 4.2 3.9  AST _1 ALT _2 ALKPHOS 56 64 66  BILITOT 0.9 0.9 0.96  BILIDIR 0.1 0.2  --    PATHOLOGY REPORT 12/13/2014 1. Breast, left, needle core biopsy, 11:30 o'clock - INVASIVE MAMMARY CARCINOMA,  SEE COMMENT. - MAMMARY CARCINOMA IN SITU. 2. Breast, left, needle core biopsy, 9:30 o'clock - INVASIVE MAMMARY CARCINOMA, SEE COMMENT. Microscopic Comment 1. and 2. There is absence of myoepithelial layer demonstrated (smooth muscle myosin heavy chain, calponin, and p63 immunostains). Both the in situ and invasive carcinoma demonstrate absence of E-cadherin expression; supporting a lobular phenotype. Breast prognostic studies are pending and will be reported in an addendum. The case was reviewed with Dr. Avis Epley who concurs.  2. PROGNOSTIC INDICATORS - ACIS Results: IMMUNOHISTOCHEMICAL AND MORPHOMETRIC ANALYSIS BY THE AUTOMATED CELLULAR IMAGING SYSTEM (ACIS) (BLOCK 2A) Estrogen Receptor: 99%, POSITIVE, STRONG STAINING INTENSITY Progesterone Receptor: 71%, POSITIVE, STRONG STAINING INTENSITY Proliferation Marker Ki67: 33%  CHROMOGENIC IN-SITU HYBRIDIZATION Results: 1A HER-2/NEU BY CISH - NEGATIVE. RESULT RATIO OF HER2: CEP 17 SIGNALS 1.84 AVERAGE HER2 COPY NUMBER PER CELL 3.50  1. PROGNOSTIC INDICATORS - ACIS Results: IMMUNOHISTOCHEMICAL AND MORPHOMETRIC ANALYSIS BY THE AUTOMATED CELLULAR IMAGING SYSTEM (ACIS) (BLOCK 1A) Estrogen Receptor: 99%, POSITIVE, STRONG STAINING INTENSITY Progesterone Receptor: 43%, POSITIVE, MODERATE STAINING INTENSITY Proliferation Marker Ki67: 42%  RADIOGRAPHIC STUDIES: I have personally reviewed the radiological images as listed and agreed with the findings in the report.  Mr Breast Bilateral W Wo Contrast 12/23/2014   FINDINGS: Breast composition: c. Heterogeneous fibroglandular tissue.  Background parenchymal enhancement: Moderate.  Right breast: No suspicious mass or abnormal enhancement. 12 mm oval cyst in the central upper breast. Artifact from the previous biopsy lies lateral to and inferior to the breast cyst.  Left breast: Extensive, heterogeneous abnormal enhancement is seen throughout much of the left breast. Susceptibility artifact from the  biopsy clip is evident along the anterior central margin of this abnormal enhancement. Susceptibility artifact from another biopsy clip is seen deep to the nipple where there is a focal area of irregular enhancement. Overall extent of this enhancement measures 8.5 cm x 7.1 cm x 7 cm. It extends 3 anterior posterior third of the breast. The largest area of abnormal enhancement is in the middle to posterior upper outer quadrant. This is in the location of the amorphic calcifications and distortion mammographically. Enhancement kinetics from this area of abnormal enhancement areas from plateau to minimal washout.  Lymph nodes: No abnormal appearing lymph nodes.  Ancillary findings:  None.   IMPRESSION: 1. In addition to the biopsy proven area of left breast carcinoma, there is extensive abnormal enhancement throughout much of the left breast suspicious for additional areas of malignancy. Overall area of this abnormal enhancement covers 8.5 cm x 7 cm x 7.1 cm. Most discrete area of abnormal enhancement lies in the upper outer quadrant where mammographically there is architectural distortion and the  amorphic calcifications. 2. No evidence of right breast malignancy. 3. No suspicious or enlarged lymph nodes.  RECOMMENDATION: Unless left mastectomy is planned, biopsy of the upper outer quadrant of the left breast (where there is distortion and pleomorphic calcifications, as well as the dominant area of the abnormal MRI enhancement) under stereotactic guidance would be recommended to confirm extent of disease.  BI-RADS CATEGORY  5: Highly suggestive of malignancy.   Electronically Signed   By: Lajean Manes M.D.   On: 12/23/2014 11:23    Mm Diag Breast Tomo Uni Left 12/09/2014  FINDINGS: There is a large area of distortion located within the superior left breast extending from approximately the 10 o'clock position to the 2 o'clock position. There are numerous variably sized and shaped calcifications seen within the  superior left breast which span 8 cm.  Mammographic images were processed with CAD.  On physical exam, there is a large, firm, palpable mass occupying the majority of the superior portion of the left breast extending from approximately the 9:30 o'clock position to the 2:30 o'clock position centered at approximately the 11:30 o'clock position. By physical examination this measures approximately 10 cm in size.  There is no palpable left axillary adenopathy.  Targeted ultrasound is performed, showing a diffuse, large, irregular, hypoechoic mass with shadowing located within the superior left breast extending from the 9:30 o'clock position to approximately the 2:30 o'clock position and centered at approximately the 11:30 o'clock position 4 cm from the nipple. This corresponds to the large palpable mass. This is difficult to size accurately by ultrasound but measures approximately 8 cm in greatest dimension. This is a suspicious mass. Tissue sampling via ultrasound-guided core biopsy of 2 separate areas is recommended (to determine extent of disease).  Ultrasound of the left axilla demonstrates no adenopathy and normal axillary contents.  IMPRESSION: Large palpable mass associated with distortion, shadowing, and numerous microcalcifications. Ultrasound-guided core biopsy of 2 sites is recommended. This is scheduled for Friday 12/13/2014.  RECOMMENDATION: Left breast ultrasound-guided core biopsies as discussed above.  I have discussed the findings and recommendations with the patient. Results were also provided in writing at the conclusion of the visit. If applicable, a reminder letter will be sent to the patient regarding the next appointment.  BI-RADS CATEGORY  5: Highly suggestive of malignancy.   Electronically Signed   By: Altamese Cabal M.D.   On: 12/09/2014 16:34   [place] Lt Breast Bx W Loc Dev 1st Lesion Img Bx Spec US Guide  12/18/2014   ADDENDUM REPORT: 12/18/2014 13:16  ADDENDUM: The pathology revealed  invasive mammary carcinoma at both the 9:30 o'clock and 11:30 o'clock biopsy sites. This is found to be concordant with imaging findings. I discussed the results over the phone with the patient. The patient states she is doing well post biopsy without complications. Recommend MRI of the breast and [facility]. The patient was given the appointment dates for both.   Electronically Signed   By: Abelardo Diesel M.D.   On: 12/18/2014 13:16   12/18/2014   CLINICAL DATA:  Hypoechoic lesions left breast for biopsy.  EXAM: ULTRASOUND GUIDED LEFT BREAST CORE NEEDLE BIOPSIES  COMPARISON:  Previous exam(s).  FINDINGS: I met with the patient and we discussed the procedure of ultrasound-guided biopsy, including benefits and alternatives. We discussed the high likelihood of a successful procedure. We discussed the risks of the procedure, including infection, bleeding, tissue injury, clip migration, and inadequate sampling. Informed written consent was given. The usual time-out protocol was  performed immediately prior to the procedure.  Using sterile technique and 2% Lidocaine as local anesthetic, under direct ultrasound visualization, a 14 gauge spring-loaded device was used to perform biopsy of hypoechoic area of left breast 11:30 o'clock using a medial approach. At the conclusion of the procedure a heart shaped tissue marker clip was deployed into the biopsy cavity. Follow up 2 view mammogram was performed and dictated separately.  Using sterile technique and 2% Lidocaine as local anesthetic, under direct ultrasound visualization, a 14 gauge spring-loaded device was used to perform biopsy of hypoechoic area of left breast 9:30 o'clock using a medial approach. At the conclusion of the procedure a ribbon tissue marker clip was deployed into the biopsy cavity. Follow up 2 view mammogram was performed and dictated separately.  IMPRESSION: Ultrasound guided biopsies of left breast. No apparent complications.  Electronically Signed:  By: Abelardo Diesel M.D. On: 12/13/2014 16:24   [place] Lt Breast Bx W Loc Dev Ea Add Lesion Img Bx Spec US Guide  12/18/2014   ADDENDUM REPORT: 12/18/2014 13:16  ADDENDUM: The pathology revealed invasive mammary carcinoma at both the 9:30 o'clock and 11:30 o'clock biopsy sites. This is found to be concordant with imaging findings. I discussed the results over the phone with the patient. The patient states she is doing well post biopsy without complications. Recommend MRI of the breast and [place] [facility]. The patient was given the appointment dates for both.   Electronically Signed   By: Abelardo Diesel M.D.   On: 12/18/2014 13:16   12/18/2014   CLINICAL DATA:  Hypoechoic lesions left breast for biopsy.  EXAM: ULTRASOUND GUIDED LEFT BREAST CORE NEEDLE BIOPSIES  COMPARISON:  Previous exam(s).  FINDINGS: I met with the patient and we discussed the procedure of ultrasound-guided biopsy, including benefits and alternatives. We discussed the high likelihood of a successful procedure. We discussed the risks of the procedure, including infection, bleeding, tissue injury, clip migration, and inadequate sampling. Informed written consent was given. The usual time-out protocol was performed immediately prior to the procedure.  Using sterile technique and 2% Lidocaine as local anesthetic, under direct ultrasound visualization, a 14 gauge spring-loaded device was used to perform biopsy of hypoechoic area of left breast 11:30 o'clock using a medial approach. At the conclusion of the procedure a heart shaped tissue marker clip was deployed into the biopsy cavity. Follow up 2 view mammogram was performed and dictated separately.  Using sterile technique and 2% Lidocaine as local anesthetic, under direct ultrasound visualization, a 14 gauge spring-loaded device was used to perform biopsy of hypoechoic area of left breast 9:30 o'clock using a medial approach. At the conclusion of the procedure a ribbon tissue marker clip was deployed into  the biopsy cavity. Follow up 2 view mammogram was performed and dictated separately.  IMPRESSION: Ultrasound guided biopsies of left breast. No apparent complications.  Electronically Signed: By: Abelardo Diesel M.D. On: 12/13/2014 16:24    ASSESSMENT & PLAN:  58 yo female, with PMH of  Hypertension and a mild IBS , otherwise very fit and healthy postmenopausal woman , who was found to have left breast cancer by screening mammogram.  1. CT3N0M0,  Stage IIB,  Invasive lobular carcinoma,  Strongly ER and PR positive, HER-2 negative, Ki67 33-42%, and lobular carcinoma in situ - I reviewed her images findings and biopsy results  In great details with patient and her husband. - she is likely need a mastectomy giving the large size of the tumor,  She was  seen by Dr. Dalbert Batman today. - giving the large size of the tumor, neoadjuvant therapy is  Preferred to achieve computed surgical resection.  However some lobular carcinoma are not very sensitive to chemotherapy,  She has no clinical lymph nodes involvement, tumor Ki-67 is moderate,  I recommend to have a Oncotype DX on her biopsy tissue , to see if chemotherapy is beneficial. If she has intermedia all high recurrence score on Oncotype DX, I would recommend new adjuvant chemotherapy. - if the Oncotype recurrence score is low, I think the benefit of neoadjuvant or adjuvant chemotherapy is minimal. Giving the strong ER/PR positivity, I would recommend neoadjuvant endocrine therapy with aromatase inhibitor. - she is likely also need adjuvant radiation, she was seen by Dr. Corlis Hove was today. - I'll plan to see her back in 2-3 weeks to discuss the Oncotype test results, and finalize her neoadjuvant therapy.  2.  Hypertension - continue medication. Follow-up of his primary care physician   Patient and her husband had multiple questions,  And I answered to their satisfaction. The patient knows to call the clinic with any problems, questions or concerns. I spent 55  minutes counseling the patient face to face. The total time spent in the appointment was 60 minutes and more than 50% was on counseling.     Truitt Merle, MD 12/26/2014 1:06 PM

## 2014-12-25 ENCOUNTER — Encounter: Payer: Self-pay | Admitting: Hematology

## 2014-12-25 ENCOUNTER — Other Ambulatory Visit (HOSPITAL_BASED_OUTPATIENT_CLINIC_OR_DEPARTMENT_OTHER): Payer: BLUE CROSS/BLUE SHIELD

## 2014-12-25 ENCOUNTER — Encounter: Payer: Self-pay | Admitting: Physical Therapy

## 2014-12-25 ENCOUNTER — Ambulatory Visit
Admission: RE | Admit: 2014-12-25 | Discharge: 2014-12-25 | Disposition: A | Discharge status: Home / Self Care | Payer: BLUE CROSS/BLUE SHIELD | Source: Ambulatory Visit | Attending: Radiation Oncology | Admitting: Radiation Oncology

## 2014-12-25 ENCOUNTER — Encounter: Payer: Self-pay | Admitting: *Deleted

## 2014-12-25 ENCOUNTER — Encounter: Payer: Self-pay | Admitting: General Practice

## 2014-12-25 ENCOUNTER — Encounter: Payer: Self-pay | Admitting: Skilled Nursing Facility1

## 2014-12-25 ENCOUNTER — Ambulatory Visit: Payer: BLUE CROSS/BLUE SHIELD

## 2014-12-25 ENCOUNTER — Ambulatory Visit: Payer: BLUE CROSS/BLUE SHIELD | Attending: General Surgery | Admitting: Physical Therapy

## 2014-12-25 ENCOUNTER — Ambulatory Visit (HOSPITAL_BASED_OUTPATIENT_CLINIC_OR_DEPARTMENT_OTHER): Payer: BLUE CROSS/BLUE SHIELD | Admitting: Hematology

## 2014-12-25 VITALS — BP 131/83 | HR 81 | Temp 97.7°F | Resp 18 | Ht 65.0 in | Wt 175.0 lb

## 2014-12-25 DIAGNOSIS — C50212 Malignant neoplasm of upper-inner quadrant of left female breast: Secondary | ICD-10-CM

## 2014-12-25 DIAGNOSIS — C50812 Malignant neoplasm of overlapping sites of left female breast: Secondary | ICD-10-CM

## 2014-12-25 DIAGNOSIS — C50912 Malignant neoplasm of unspecified site of left female breast: Secondary | ICD-10-CM

## 2014-12-25 DIAGNOSIS — I10 Essential (primary) hypertension: Secondary | ICD-10-CM | POA: Diagnosis not present

## 2014-12-25 DIAGNOSIS — R293 Abnormal posture: Secondary | ICD-10-CM

## 2014-12-25 DIAGNOSIS — Z170 Estrogen receptor positive status [ER+]: Secondary | ICD-10-CM | POA: Diagnosis not present

## 2014-12-25 LAB — COMPREHENSIVE METABOLIC PANEL (CC13)
ALK PHOS: 66 U/L (ref 40–150)
ALT: 21 U/L (ref 0–55)
AST: 18 U/L (ref 5–34)
Albumin: 3.9 g/dL (ref 3.5–5.0)
Anion Gap: 10 mEq/L (ref 3–11)
BUN: 10.6 mg/dL (ref 7.0–26.0)
CO2: 27 mEq/L (ref 22–29)
CREATININE: 0.8 mg/dL (ref 0.6–1.1)
Calcium: 9.8 mg/dL (ref 8.4–10.4)
Chloride: 103 mEq/L (ref 98–109)
EGFR: 84 mL/min/{1.73_m2} — ABNORMAL LOW (ref >90)
Glucose: 113 mg/dl (ref 70–140)
Potassium: 3.6 mEq/L (ref 3.5–5.1)
Sodium: 139 mEq/L (ref 136–145)
Total Bilirubin: 0.96 mg/dL (ref 0.20–1.20)
Total Protein: 7.6 g/dL (ref 6.4–8.3)

## 2014-12-25 LAB — CBC WITH DIFFERENTIAL/PLATELET
BASO%: 0.6 % (ref 0.0–2.0)
Basophils Absolute: 0 10*3/uL (ref 0.0–0.1)
EOS%: 1.4 % (ref 0.0–7.0)
Eosinophils Absolute: 0.1 10*3/uL (ref 0.0–0.5)
HEMATOCRIT: 42.2 % (ref 34.8–46.6)
HGB: 14 g/dL (ref 11.6–15.9)
LYMPH#: 1.2 10*3/uL (ref 0.9–3.3)
LYMPH%: 21.2 % (ref 14.0–49.7)
MCH: 29.7 pg (ref 25.1–34.0)
MCHC: 33.2 g/dL (ref 31.5–36.0)
MCV: 89.5 fL (ref 79.5–101.0)
MONO#: 0.4 10*3/uL (ref 0.1–0.9)
MONO%: 6.2 % (ref 0.0–14.0)
NEUT%: 70.6 % (ref 38.4–76.8)
NEUTROS ABS: 4.1 10*3/uL (ref 1.5–6.5)
Platelets: 338 10*3/uL (ref 145–400)
RBC: 4.72 10*6/uL (ref 3.70–5.45)
RDW: 12.8 % (ref 11.2–14.5)
WBC: 5.8 10*3/uL (ref 3.9–10.3)

## 2014-12-25 NOTE — Patient Instructions (Signed)

## 2014-12-25 NOTE — Therapy (Signed)
[place], [place], [address] Phone: [phone]   Fax:  [phone]  Physical Therapy Evaluation  Patient Details  Name: Emily Griffith MRN: [id number] Date of Birth: 05-06-1956 Referring Provider:  Fanny Skates, MD  Encounter Date: 12/25/2014      PT End of Session - 12/25/14 1313    Visit Number 1   Number of Visits 1   PT Start Time 1135  Also saw pt [id number]   PT Stop Time 1200   PT Time Calculation (min) 25 min   Activity Tolerance Patient tolerated treatment well   Behavior During Therapy [facility] for tasks assessed/performed      Past Medical History  Diagnosis Date  . Chicken pox   . Hypertension   . Hyperlipidemia   . IBS (irritable bowel syndrome)   . Breast cancer of upper-inner quadrant of left female breast 12/20/2014    Past Surgical History  Procedure Laterality Date  . Laser ablation of the cervix  1991    There were no vitals filed for this visit.  Visit Diagnosis:  Abnormal posture - Plan: PT plan of care cert/re-cert  Left breast cancer with T3 tumor, >5 cm in greatest dimension - Plan: PT plan of care cert/re-cert      Subjective Assessment - 12/25/14 1242    Symptoms Patient was seen today for a baseline assessment of her new left breast cancer.   Pertinent History She was diagnosed 12/18/14 with left ER/PR positive, HER2 negative breast cancer with a Ki67 of 42%.     Patient Stated Goals Learn post op shoulder ROM HEP and lymphedema risk reduction   Currently in Pain? No/denies            [facility] PT Assessment - 12/25/14 0001    Assessment   Medical Diagnosis Left breast cancer   Onset Date 12/18/14   Precautions   Precautions Other (comment)  Active breast cancer   Restrictions   Weight Bearing Restrictions No   Balance Screen   Has the patient fallen in the past 6 months No   Has the patient had a decrease in activity level because of a fear of  falling?  No   Is the patient reluctant to leave their home because of a fear of falling?  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[employer], desk work   Leisure golfs on weekends, walks 50 min daily   Cognition   Overall Cognitive Status Within Functional Limits for tasks assessed   Posture/Postural Control   Posture/Postural Control Postural limitations   Postural Limitations Rounded Shoulders;Forward head   ROM / Strength   AROM / PROM / Strength AROM;Strength   AROM   AROM Assessment Site Shoulder   Right/Left Shoulder Right;Left   Right Shoulder Extension 51 Degrees   Right Shoulder Flexion 156 Degrees   Right Shoulder ABduction 176 Degrees   Right Shoulder Internal Rotation 72 Degrees   Right Shoulder External Rotation 81 Degrees   Left Shoulder Extension 57 Degrees   Left Shoulder Flexion 151 Degrees   Left Shoulder ABduction 168 Degrees   Left Shoulder Internal Rotation 77 Degrees   Left Shoulder External Rotation 78 Degrees   Strength  Overall Strength Within functional limits for tasks performed           LYMPHEDEMA/ONCOLOGY QUESTIONNAIRE - 12/25/14 1310    Type   Cancer Type Left breast   Lymphedema Assessments   Lymphedema Assessments Upper extremities   Right Upper Extremity Lymphedema   10 cm Proximal to Olecranon Process 29.5 cm   Olecranon Process 24 cm   10 cm Proximal to Ulnar Styloid Process 21.4 cm   Just Proximal to Ulnar Styloid Process 14.1 cm   Across Hand at [employer] 17.6 cm   At [place] of 2nd Digit 5.8 cm   Left Upper Extremity Lymphedema   10 cm Proximal to Olecranon Process 28.8 cm   Olecranon Process 23.6 cm   10 cm Proximal to Ulnar Styloid Process 20.1 cm   Just  Proximal to Ulnar Styloid Process 14 cm   Across Hand at [employer] 17.2 cm   At [place] of 2nd Digit 5.8 cm      Patient was instructed today in a home exercise program today for post op shoulder range of motion. These included active assist shoulder flexion in sitting, scapular retraction, wall walking with shoulder abduction, and hands behind head external rotation.  She was encouraged to do these twice a day, holding 3 seconds and repeating 5 times when permitted by her physician.           PT Education - 12/25/14 1312    Education provided Yes   Education Details Post op shoulder ROM HEP and lymphedema risk reduction   Person(s) Educated Patient   Methods Explanation;Demonstration;Handout   Comprehension Verbalized understanding;Returned demonstration              Breast Clinic Goals - 12/25/14 1325    Patient will be able to verbalize understanding of pertinent lymphedema risk reduction practices relevant to her diagnosis specifically related to skin care.   Time 1   Period Days   Status Achieved   Patient will be able to return demonstrate and/or verbalize understanding of the post-op home exercise program related to regaining shoulder range of motion.   Time 1   Period Days   Status Achieved   Patient will be able to verbalize understanding of the importance of attending the postoperative After Breast Cancer Class for further lymphedema risk reduction education and therapeutic exercise.   Time 1   Period Days   Status Achieved              Plan - 12/25/14 1314    Clinical Impression Statement Patient was seen today for a baseline assessment of her left breast cancer.  She is planning to have neoadjuvant chemotherapy or neoadjuvant anti-estrogen therapy followed by a left mastectomy and sentinel node biopsy possibly with radiation.  She will likely benefit from PT post operatively to regain shoulder ROM and strength and prevent lympehdema.   Pt will  benefit from skilled therapeutic intervention in order to improve on the following deficits Decreased range of motion;Increased edema;Impaired UE functional use;Pain;Decreased strength;Decreased knowledge of precautions   Rehab Potential Good   Clinical Impairments Affecting Rehab Potential none   PT Frequency One time visit   PT Treatment/Interventions Patient/family education;Therapeutic exercise   Consulted and Agree with Plan of Care Patient;Family member/caregiver   Family Member Consulted Husband     Patient will follow up at outpatient cancer rehab if needed following surgery.  If the patient requires physical therapy at that time, a specific plan will be dictated  and sent to the referring physician for approval. The patient was educated today on appropriate basic range of motion exercises to begin post operatively and the importance of attending the After Breast Cancer class following surgery.  Patient was educated today on lymphedema risk reduction practices as it pertains to recommendations that will benefit the patient immediately following surgery.  She verbalized good understanding.  No additional physical therapy is indicated at this time.       Problem List Patient Active Problem List   Diagnosis Date Noted  . 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 Cervical cancer screening 11/15/2014  . Severe sprain of left ankle 02/11/2014  . Routine general medical examination at a health care facility 09/12/2013  . HTN (hypertension) 03/27/2013  . Other and unspecified hyperlipidemia 03/27/2013  . IBS (irritable bowel syndrome) 03/27/2013    Annia Friendly, PT 12/25/2014, 1:27 PM  [place], [place], [address] Phone: [phone]   Fax:  [phone]

## 2014-12-25 NOTE — Progress Notes (Signed)
Subjective:     Patient ID: Emily Griffith, female   DOB: 1955/11/08, 59 y.o.   MRN: [id number]  HPI   Review of Systems     Objective:   Physical Exam For the patient to understand and be given the tools to implement a healthy plant based diet during their cancer diagnosis.     Assessment:     Patient was seen today and found to be pleasant and accompanied by her husband. Pts left breast is afflicted. Current/relevant medications: lisinopril, atorvastatin, and vitamin D. Pts weight 12/25/14: 175 pounds BMI 29.2. Pts LDL cholesterol 120. Pt as well as husband were taking notes and were attentive. Pt states they have always eaten healthy. Pt states she has recently started walking everyday.     Plan:     A folder of evidence based information with a focus on a plant based diet and general nutrition during cancer was given to the patient.  The importance of legitimate, evidence based information was discussed and examples were given. Dietitian educated the patient on implementing a plant based diet by incorporating more plant proteins, fruits, and vegetables. As a part of a healthy routine physical activity was discussed. As a part of the continuum of care the cancer dietitian's contact information was given to the patient in the event they would like to have a follow up appointment.

## 2014-12-25 NOTE — Progress Notes (Signed)
[facility] Psychosocial Distress Screening Spiritual Care  Received distress screen from [facility] per distress screening protocol.  The patient scored a 5 on the Psychosocial Distress Thermometer which indicates moderate distress.   ONCBCN DISTRESS SCREENING 12/25/2014  Screening Type Initial Screening  Distress experienced in past week (1-10) 5  Practical problem type Insurance;Work/school  Emotional problem type Adjusting to illness;Adjusting to appearance changes  Referral to support programs Yes    Follow up needed: Yes.  Left message to offer support, encouraging pt to reach out anytime.  [place], [place]

## 2014-12-25 NOTE — Progress Notes (Signed)
Ms. Moyano is a very pleasant 60 y.o. 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  Biopsy results revealed the tumor's hormone status as ER positive, PR positive, and HER2/neu negative. Ki67 is 33-42%.  She presents today with her husband to the [facility]) for treatment consideration and recommendations from the breast surgeon, radiation oncologist, and medical oncologist.     I briefly met with Ms. Cesaro and her husband during her [facility] visit today. We discussed the purpose of the Survivorship Clinic, which will include monitoring for recurrence, coordinating completion of age and gender-appropriate cancer screenings, promotion of overall wellness, as well as managing potential late/long-term side effects of anti-cancer treatments.    The treatment plan for Ms. Maclin will likely include surgery, chemotherapy vs. Anti-estrogren therapy, and radiation therapy.  As of today, the intent of treatment for Ms. Lares is cure, therefore she will be eligible for the Survivorship Clinic upon her completion of treatment.  Her survivorship care plan (SCP) document will be drafted and updated throughout the course of her treatment trajectory. She will receive the SCP in an office visit with myself in the Survivorship Clinic once she has completed treatment.   Ms. Wolz was encouraged to ask questions and all questions were answered to her satisfaction.  She was given my business card and encouraged to contact me with any concerns regarding survivorship.  I look forward to participating in her care.   Mike Craze, NP [place] [phone]

## 2014-12-25 NOTE — Progress Notes (Signed)
Checked in new pt with no financial concerns at this time.  Informed pt if chemo is part of her treatment Raquel will call her ins to see if Josem Kaufmann is req and will obtain it if it is as well as contact foundations that offer copay assistance if needed.  She has Raquel's card for any billing questions or concerns.

## 2014-12-25 NOTE — Progress Notes (Signed)
  Radiation Oncology         [id number]) [phone] ________________________________  Initial outpatient Consultation - Date: 12/25/2014   Name: Emily Griffith MRN: [id number]   DOB: 1956-01-05  REFERRING PHYSICIAN: Fanny Skates, MD  DIAGNOSIS:    ICD-9-CM ICD-10-CM   1. Breast cancer of upper-inner quadrant of left female breast 174.2 C50.212     STAGE: Breast cancer of upper-inner quadrant of left female breast   Staging form: Breast, AJCC 7th Edition     Clinical stage from 12/25/2014: Stage IIB (T3, N0, M0) - Unsigned       Staging comments: Staged at breast conference on 3.23.16  HISTORY OF PRESENT ILLNESS::Emily Griffith is a 59 y.o. female  Presented for a screening mammogram. A palpable left breast mass was felt.  The mammogram showed an 8 cm mass. 2 biopsies were performed with clips 3.3 cm apart. An MRI showed an 8.5 x 7.7 x 7.0 cm mass with no enlarged nodes seen. Both biopsied showed a Grade 2 invasive mammary carcinoma favoring a lobular phenotype which was ER+, PR+ HER2- with a Ki67 of 33-42%. She has done well since her biopsies.  She is accompanied by her husband. She has no prior radiation. She is GXP0 and is post menopausal.   PREVIOUS RADIATION THERAPY: No  Past medical, social and family history were reviewed in the electronic chart. Review of symptoms was reviewed in the electronic chart. Medications were reviewed in the electronic chart.   PHYSICAL EXAM: There were no vitals filed for this visit.. . Pleasant female in no distress. No palpable cervical, supraclavicular or axillary adenopathy bilaterally. Large palpable left breast mass over upper to lateral outer breast. No palpable abnormalities of the right breast.   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 We discussed the equivalence in terms of survival and local failure between mastectomy and breast  conservation. We discussed the role of radiation in decreasing local failures in patients who undergo mastectomy and have risk factors for recurrence including positive lymph nodes and/or tumors over 5 cm and/or positive margins. We discussed the process of simulation and the placement tattoos. We discussed 6 weeks of treatment as an outpatient. We discussed the possibility of asymptomatic lung damage. We discussed the low likelihood of secondary malignancies. We discussed the possible side effects including but not limited to skin redness, fatigue, permanent skin darkening, and chest wall swelling. We discussed increased complications that can occur with reconstruction after radiation.    We are performing an oncotype on the initial biopsy to determine need for chemotherapy vs. Antiestrogen treatment in the neoadjuvant setting.   I will see her back after her neoadjuvant treatment and surgery.   I recommended she she plastic surgery prior to surgery.   I spent 40 minutes  face to face with the patient and more than 50% of that time was spent in counseling and/or coordination of care.   ------------------------------------------------  Thea Silversmith, MD

## 2014-12-25 NOTE — Progress Notes (Signed)
Ordered oncotype per Dr. Burr Medico on core biopsy.  Faxed requisition to pathology and confirmed with [facility].  Faxed PAC to [employer] and [place].

## 2014-12-26 ENCOUNTER — Encounter: Payer: Self-pay | Admitting: Hematology

## 2015-01-01 ENCOUNTER — Telehealth: Payer: Self-pay | Admitting: *Deleted

## 2015-01-01 NOTE — Telephone Encounter (Signed)
Left vm for pt to return call regarding [place] from 3.23.16

## 2015-01-02 ENCOUNTER — Telehealth: Payer: Self-pay | Admitting: Hematology

## 2015-01-02 ENCOUNTER — Telehealth: Payer: Self-pay | Admitting: *Deleted

## 2015-01-02 NOTE — Telephone Encounter (Signed)
Spoke to regarding [place] from 12/25/14. Denies questions or concerns regarding dx or treatment care plan. Encourage pt to call with needs. Received verbal understanding. Contact information given.

## 2015-01-02 NOTE — Telephone Encounter (Signed)
Left message to confirm appointment for April °

## 2015-01-03 ENCOUNTER — Telehealth: Payer: Self-pay | Admitting: *Deleted

## 2015-01-03 NOTE — Telephone Encounter (Signed)
Emily Griffith with [facility] case [employer] info  [phone] ext [phone]

## 2015-01-06 ENCOUNTER — Telehealth: Payer: Self-pay | Admitting: *Deleted

## 2015-01-06 ENCOUNTER — Encounter (HOSPITAL_COMMUNITY): Payer: Self-pay

## 2015-01-06 ENCOUNTER — Other Ambulatory Visit: Payer: Self-pay | Admitting: Hematology

## 2015-01-06 NOTE — Telephone Encounter (Signed)
Received oncotype score of 27/18%. Copy given to Dr. Burr Medico. Original to HIM for scanning. Physician team notified.

## 2015-01-07 ENCOUNTER — Telehealth: Payer: Self-pay | Admitting: Hematology

## 2015-01-07 ENCOUNTER — Encounter: Payer: Self-pay | Admitting: *Deleted

## 2015-01-07 NOTE — Telephone Encounter (Signed)
Lft msg for pt confirming labs/ov per 04/05 POF... KJ

## 2015-01-09 ENCOUNTER — Encounter: Payer: Self-pay | Admitting: Hematology

## 2015-01-09 ENCOUNTER — Other Ambulatory Visit: Payer: BLUE CROSS/BLUE SHIELD

## 2015-01-09 ENCOUNTER — Ambulatory Visit (HOSPITAL_BASED_OUTPATIENT_CLINIC_OR_DEPARTMENT_OTHER): Payer: BLUE CROSS/BLUE SHIELD | Admitting: Hematology

## 2015-01-09 ENCOUNTER — Telehealth: Payer: Self-pay | Admitting: Hematology

## 2015-01-09 VITALS — BP 138/85 | HR 73 | Temp 97.4°F | Resp 18 | Ht 65.0 in | Wt 174.8 lb

## 2015-01-09 DIAGNOSIS — C50212 Malignant neoplasm of upper-inner quadrant of left female breast: Secondary | ICD-10-CM

## 2015-01-09 DIAGNOSIS — C50812 Malignant neoplasm of overlapping sites of left female breast: Secondary | ICD-10-CM | POA: Diagnosis not present

## 2015-01-09 DIAGNOSIS — I10 Essential (primary) hypertension: Secondary | ICD-10-CM

## 2015-01-09 NOTE — Telephone Encounter (Signed)
Gave patient avs report and appointment for 4/8. Per patient 4/14 f/u cxd due to f/u today was in place of 4/14. Per patient after ched class she will make a decision and called back for f/u. No other other except ched class requested per 4/7 pof.

## 2015-01-10 ENCOUNTER — Other Ambulatory Visit: Payer: BLUE CROSS/BLUE SHIELD

## 2015-01-10 ENCOUNTER — Encounter: Payer: Self-pay | Admitting: *Deleted

## 2015-01-11 ENCOUNTER — Encounter: Payer: Self-pay | Admitting: Hematology

## 2015-01-11 NOTE — Progress Notes (Signed)
[place]  [phone]) [phone] Fax:([phone]) [place] Note   Patient Care Team: Midge Minium, MD as PCP - General (Family Medicine) Fanny Skates, MD as Consulting Physician (General Surgery) Truitt Merle, MD as Consulting Physician (Hematology) Thea Silversmith, MD as Consulting Physician (Radiation Oncology) Rockwell Germany, RN as Registered Nurse Mauro Kaufmann, RN as Registered Nurse Holley Bouche, NP as Nurse Practitioner (Nurse Practitioner) 01/09/2015  CHIEF COMPLAINTS/PURPOSE OF CONSULTATION:  Newly diagnosed breast cancer  Oncology History   Breast cancer of upper-inner quadrant of left female breast   Staging form: Breast, AJCC 7th Edition     Clinical stage from 12/25/2014: Stage IIB (T3, N0, M0) - Unsigned       Staging comments: Staged at breast conference on 3.23.16        Breast cancer of upper-inner quadrant of left female breast   12/09/2014 Breast US ultrasound is performed, showing a diffuse, large, irregular, hypoechoic mass with shadowing located within the superior left breast extending from the 9:30 o'clock position to approximately the 2:30 o'clock position and centered at approximately the    12/13/2014 Oncotype testing RS 27, predicted recurrent risk of 18% with tamoxifen alone   12/13/2014 Pathology Results Breast, left, needle core biopsy, 11:30 o'clock - INVASIVE MAMMARY CARCINOMA, SEE COMMENT. - MAMMARY CARCINOMA IN SITU. 2. Breast, left, needle core biopsy, 9:30 o'clock - INVASIVE MAMMARY CARCINOMA, SEE COMMENT   12/13/2014 Receptors her2 Estrogen Receptor: 99%, POSITIVE, STRONG STAINING INTENSITY Progesterone Receptor: 43%, POSITIVE, MODERATE STAINING INTENSITY Proliferation Marker Ki67: 42%  HER-2/NEU BY CISH - NEGATIVE   12/20/2014 Initial Diagnosis Breast cancer of upper-inner quadrant of left female breast   12/23/2014 Breast MRI there is extensive abnormal enhancement throughout much of the left breast  suspicious for additional areas of malignancy. Overall area of this abnormal enhancement covers 8.5 cm x 7 cm x 7.1 cm. Most discrete area of abnormal enhancement lies in the up     HISTORY OF PRESENTING ILLNESS:  Emily Griffith 59 y.o. female is here because of newly diagnosed left breast cancer.  This was found by screening mammogram. Her prior screening mammogram was in November 2014 which was negative. Her screening mammogram on 11/25/2014 showed a possible mass and distortion in the left breast. She further underwent diagnostic mammogram and ultrasound on 12/09/2014, which showed a diffuse large irregular hypoechogenic mass extending from 9:30 o'clock position 2-30 o'clock position. Biopsy of this large mass at 2 different positions showed invasive lobular carcinoma.  She denies any symptoms. She feels well overall. She denies any pain, cough, dyspnea, or any GI symptoms. 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 No recent weight loss. No change of her appetite.   She had right breast biopsy was benign. She always has lumpy breast which has not changed per patient. She has IBS, she has dirrhea which is usually triggered by stress, and she takes Imodium as needed.    INTERIM HISTORY: Emily Griffith returns for follow-up and discuss Oncotype results. She feels well, denies any new complaints.  MEDICAL HISTORY:  Past Medical History  Diagnosis Date  . Chicken pox   . Hypertension   . Hyperlipidemia   . IBS (irritable bowel syndrome)   . Breast cancer of upper-inner quadrant of left female breast 12/20/2014    SURGICAL HISTORY: Past Surgical History  Procedure Laterality Date  . Laser ablation of the cervix  1991  SOCIAL HISTORY: History   Social History  . Marital Status: Married    Spouse Name: N/A  . Number of Children: 0  . Years of Education: N/A   Occupational History  . English as a second language teacher for a company    Social History  Main Topics  . Smoking status: Never Smoker   . Smokeless tobacco: Not on file  . Alcohol Use: Yes     Comment: social   . Drug Use: No  . Sexual Activity: Not on file   Other Topics Concern  . Not on file   Social History Narrative   GYN HISTORY  Menarchal: 11 LMP: 03/2013  Contraceptive: no HRT: no  G0P0:    FAMILY HISTORY: Family History  Problem Relation Age of Onset  . Hypertension Mother   . Sudden death Father 30    bleeding   . Cancer Maternal Uncle 60    unknown cancer   . Cancer Paternal Uncle 109    unknown cancer     ALLERGIES:  has No Known Allergies.  MEDICATIONS:  Current Outpatient Prescriptions  Medication Sig Dispense Refill  . atorvastatin (LIPITOR) 40 MG tablet TAKE 1 TABLET EVERY NIGHT AT BEDTIME 90 tablet 0  . dicyclomine (BENTYL) 20 MG tablet TAKE 1 TABLET BY MOUTH EVERY 6 HOURS (Patient taking differently: TAKE 1 TABLET BY MOUTH EVERY 6 HOURS AS NEEDED) 60 tablet 3  . lisinopril-hydrochlorothiazide (PRINZIDE,ZESTORETIC) 20-12.5 MG per tablet TAKE 1 TABLET DAILY 90 tablet 0  . potassium chloride SA (K-DUR,KLOR-CON) 20 MEQ tablet Take 1 tablet (20 mEq total) by mouth daily. 90 tablet 3  . Vitamin D, Ergocalciferol, (DRISDOL) 50000 UNITS CAPS capsule Take 1 capsule (50,000 Units total) by mouth every 7 (seven) days. 12 capsule 0   No current facility-administered medications for this visit.    REVIEW OF SYSTEMS:   Constitutional: Denies fevers, chills or abnormal night sweats Eyes: Denies blurriness of vision, double vision or watery eyes Ears, nose, mouth, throat, and face: Denies mucositis or sore throat Respiratory: Denies cough, dyspnea or wheezes Cardiovascular: Denies palpitation, chest discomfort or lower extremity swelling Gastrointestinal:  Denies nausea, heartburn or change in bowel habits Skin: Denies abnormal skin rashes Lymphatics: Denies new lymphadenopathy or easy bruising Neurological:Denies numbness, tingling or new  weaknesses Behavioral/Psych: Mood is stable, no new changes  All other systems were reviewed with the patient and are negative.  PHYSICAL EXAMINATION: ECOG PERFORMANCE STATUS: 0 - Asymptomatic  Filed Vitals:   01/09/15 1417  BP: 138/85  Pulse: 73  Temp: 97.4 F (36.3 C)  Resp: 18   Filed Weights   01/09/15 1417  Weight: 174 lb 12.8 oz (79.289 kg)    GENERAL:alert, no distress and comfortable SKIN: skin color, texture, turgor are normal, no rashes or significant lesions EYES: normal, conjunctiva are pink and non-injected, sclera clear OROPHARYNX:no exudate, no erythema and lips, buccal mucosa, and tongue normal  NECK: supple, thyroid normal size, non-tender, without nodularity LYMPH:  no palpable lymphadenopathy in the cervical, axillary or inguinal LUNGS: clear to auscultation and percussion with normal breathing effort HEART: regular rate & rhythm and no murmurs and no lower extremity edema ABDOMEN:abdomen soft, non-tender and normal bowel sounds Musculoskeletal:no cyanosis of digits and no clubbing  PSYCH: alert & oriented x 3 with fluent speech NEURO: no focal motor/sensory deficits Breasts: Breast inspection showed them to be symmetrical with no nipple discharge. (+) Skin bruise at the biopsy sites of left breast. Palpation of the left breasts showed fullness at the  left upper quadrant breast but no discrete mass, which has not changed per patient, palpitation of the right breast and axilla revealed no obvious mass that I could appreciate.   LABORATORY DATA:  I have reviewed the data as listed Lab Results  Component Value Date   WBC 5.8 12/25/2014   HGB 14.0 12/25/2014   HCT 42.2 12/25/2014   MCV 89.5 12/25/2014   PLT 338 12/25/2014    Recent Labs  03/18/14 1504 11/15/14 1151 12/25/14 0838  NA 136 138 139  K 3.6 3.7 3.6  CL 100 101  --   CO2 28 31 27   GLUCOSE 87 97 113  BUN 12 19 10.6  CREATININE 0.7 0.75 0.8  CALCIUM 9.8 9.8 9.8  PROT 7.8 7.7 7.6    ALBUMIN 4.2 4.2 3.9  AST 20 20 18   ALT 19 25 21   ALKPHOS 56 64 66  BILITOT 0.9 0.9 0.96  BILIDIR 0.1 0.2  --    PATHOLOGY REPORT 12/13/2014 1. Breast, left, needle core biopsy, 11:30 o'clock - INVASIVE MAMMARY CARCINOMA, SEE COMMENT. - MAMMARY CARCINOMA IN SITU. 2. Breast, left, needle core biopsy, 9:30 o'clock - INVASIVE MAMMARY CARCINOMA, SEE COMMENT. Microscopic Comment 1. and 2. There is absence of myoepithelial layer demonstrated (smooth muscle myosin heavy chain, calponin, and p63 immunostains). Both the in situ and invasive carcinoma demonstrate absence of E-cadherin expression; supporting a lobular phenotype. Breast prognostic studies are pending and will be reported in an addendum. The case was reviewed with Dr. Avis Epley who concurs.  2. PROGNOSTIC INDICATORS - ACIS Results: IMMUNOHISTOCHEMICAL AND MORPHOMETRIC ANALYSIS BY THE AUTOMATED CELLULAR IMAGING SYSTEM (ACIS) (BLOCK 2A) Estrogen Receptor: 99%, POSITIVE, STRONG STAINING INTENSITY Progesterone Receptor: 71%, POSITIVE, STRONG STAINING INTENSITY Proliferation Marker Ki67: 33%  CHROMOGENIC IN-SITU HYBRIDIZATION Results: 1A HER-2/NEU BY CISH - NEGATIVE. RESULT RATIO OF HER2: CEP 17 SIGNALS 1.84 AVERAGE HER2 COPY NUMBER PER CELL 3.50  1. PROGNOSTIC INDICATORS - ACIS Results: IMMUNOHISTOCHEMICAL AND MORPHOMETRIC ANALYSIS BY THE AUTOMATED CELLULAR IMAGING SYSTEM (ACIS) (BLOCK 1A) Estrogen Receptor: 99%, POSITIVE, STRONG STAINING INTENSITY Progesterone Receptor: 43%, POSITIVE, MODERATE STAINING INTENSITY Proliferation Marker Ki67: 42%  RADIOGRAPHIC STUDIES: I have personally reviewed the radiological images as listed and agreed with the findings in the report.  Mr Breast Bilateral W Wo Contrast 12/23/2014   FINDINGS: Breast composition: c. Heterogeneous fibroglandular tissue.  Background parenchymal enhancement: Moderate.  Right breast: No suspicious mass or abnormal enhancement. 12 mm oval cyst in the central  upper breast. Artifact from the previous biopsy lies lateral to and inferior to the breast cyst.  Left breast: Extensive, heterogeneous abnormal enhancement is seen throughout much of the left breast. Susceptibility artifact from the biopsy clip is evident along the anterior central margin of this abnormal enhancement. Susceptibility artifact from another biopsy clip is seen deep to the nipple where there is a focal area of irregular enhancement. Overall extent of this enhancement measures 8.5 cm x 7.1 cm x 7 cm. It extends 3 anterior posterior third of the breast. The largest area of abnormal enhancement is in the middle to posterior upper outer quadrant. This is in the location of the amorphic calcifications and distortion mammographically. Enhancement kinetics from this area of abnormal enhancement areas from plateau to minimal washout.  Lymph nodes: No abnormal appearing lymph nodes.  Ancillary findings:  None.   IMPRESSION: 1. In addition to the biopsy proven area of left breast carcinoma, there is extensive abnormal enhancement throughout much of the left breast suspicious for additional areas of malignancy.  Overall area of this abnormal enhancement covers 8.5 cm x 7 cm x 7.1 cm. Most discrete area of abnormal enhancement lies in the upper outer quadrant where mammographically there is architectural distortion and the amorphic calcifications. 2. No evidence of right breast malignancy. 3. No suspicious or enlarged lymph nodes.  RECOMMENDATION: Unless left mastectomy is planned, biopsy of the upper outer quadrant of the left breast (where there is distortion and pleomorphic calcifications, as well as the dominant area of the abnormal MRI enhancement) under stereotactic guidance would be recommended to confirm extent of disease.  BI-RADS CATEGORY  5: Highly suggestive of malignancy.   Electronically Signed   By: Lajean Manes M.D.   On: 12/23/2014 11:23    Mm Diag Breast Tomo Uni Left 12/09/2014  FINDINGS:  There is a large area of distortion located within the superior left breast extending from approximately the 10 o'clock position to the 2 o'clock position. There are numerous variably sized and shaped calcifications seen within the superior left breast which span 8 cm.  Mammographic images were processed with CAD.  On physical exam, there is a large, firm, palpable mass occupying the majority of the superior portion of the left breast extending from approximately the 9:30 o'clock position to the 2:30 o'clock position centered at approximately the 11:30 o'clock position. By physical examination this measures approximately 10 cm in size.  There is no palpable left axillary adenopathy.  Targeted ultrasound is performed, showing a diffuse, large, irregular, hypoechoic mass with shadowing located within the superior left breast extending from the 9:30 o'clock position to approximately the 2:30 o'clock position and centered at approximately the 11:30 o'clock position 4 cm from the nipple. This corresponds to the large palpable mass. This is difficult to size accurately by ultrasound but measures approximately 8 cm in greatest dimension. This is a suspicious mass. Tissue sampling via ultrasound-guided core biopsy of 2 separate areas is recommended (to determine extent of disease).  Ultrasound of the left axilla demonstrates no adenopathy and normal axillary contents.  IMPRESSION: Large palpable mass associated with distortion, shadowing, and numerous microcalcifications. Ultrasound-guided core biopsy of 2 sites is recommended. This is scheduled for Friday 12/13/2014.  RECOMMENDATION: Left breast ultrasound-guided core biopsies as discussed above.  I have discussed the findings and recommendations with the patient. Results were also provided in writing at the conclusion of the visit. If applicable, a reminder letter will be sent to the patient regarding the next appointment.  BI-RADS CATEGORY  5: Highly suggestive of  malignancy.   Electronically Signed   By: Altamese Cabal M.D.   On: 12/09/2014 16:34   [place] Lt Breast Bx W Loc Dev 1st Lesion Img Bx Spec US Guide  12/18/2014   ADDENDUM REPORT: 12/18/2014 13:16  ADDENDUM: The pathology revealed invasive mammary carcinoma at both the 9:30 o'clock and 11:30 o'clock biopsy sites. This is found to be concordant with imaging findings. I discussed the results over the phone with the patient. The patient states she is doing well post biopsy without complications. Recommend MRI of the breast and [place] clinic. The patient was given the appointment dates for both.   Electronically Signed   By: Abelardo Diesel M.D.   On: 12/18/2014 13:16   12/18/2014   CLINICAL DATA:  Hypoechoic lesions left breast for biopsy.  EXAM: ULTRASOUND GUIDED LEFT BREAST CORE NEEDLE BIOPSIES  COMPARISON:  Previous exam(s).  FINDINGS: I met with the patient and we discussed the procedure of ultrasound-guided biopsy, including benefits and alternatives. We  discussed the high likelihood of a successful procedure. We discussed the risks of the procedure, including infection, bleeding, tissue injury, clip migration, and inadequate sampling. Informed written consent was given. The usual time-out protocol was performed immediately prior to the procedure.  Using sterile technique and 2% Lidocaine as local anesthetic, under direct ultrasound visualization, a 14 gauge spring-loaded device was used to perform biopsy of hypoechoic area of left breast 11:30 o'clock using a medial approach. At the conclusion of the procedure a heart shaped tissue marker clip was deployed into the biopsy cavity. Follow up 2 view mammogram was performed and dictated separately.  Using sterile technique and 2% Lidocaine as local anesthetic, under direct ultrasound visualization, a 14 gauge spring-loaded device was used to perform biopsy of hypoechoic area of left breast 9:30 o'clock using a medial approach. At the conclusion of the procedure a  ribbon tissue marker clip was deployed into the biopsy cavity. Follow up 2 view mammogram was performed and dictated separately.  IMPRESSION: Ultrasound guided biopsies of left breast. No apparent complications.  Electronically Signed: By: Abelardo Diesel M.D. On: 12/13/2014 16:24   [place] Lt Breast Bx W Loc Dev Ea Add Lesion Img Bx Spec US Guide  12/18/2014   ADDENDUM REPORT: 12/18/2014 13:16  ADDENDUM: The pathology revealed invasive mammary carcinoma at both the 9:30 o'clock and 11:30 o'clock biopsy sites. This is found to be concordant with imaging findings. I discussed the results over the phone with the patient. The patient states she is doing well post biopsy without complications. Recommend MRI of the breast and [place] clinic. The patient was given the appointment dates for both.   Electronically Signed   By: Abelardo Diesel M.D.   On: 12/18/2014 13:16   12/18/2014   CLINICAL DATA:  Hypoechoic lesions left breast for biopsy.  EXAM: ULTRASOUND GUIDED LEFT BREAST CORE NEEDLE BIOPSIES  COMPARISON:  Previous exam(s).  FINDINGS: I met with the patient and we discussed the procedure of ultrasound-guided biopsy, including benefits and alternatives. We discussed the high likelihood of a successful procedure. We discussed the risks of the procedure, including infection, bleeding, tissue injury, clip migration, and inadequate sampling. Informed written consent was given. The usual time-out protocol was performed immediately prior to the procedure.  Using sterile technique and 2% Lidocaine as local anesthetic, under direct ultrasound visualization, a 14 gauge spring-loaded device was used to perform biopsy of hypoechoic area of left breast 11:30 o'clock using a medial approach. At the conclusion of the procedure a heart shaped tissue marker clip was deployed into the biopsy cavity. Follow up 2 view mammogram was performed and dictated separately.  Using sterile technique and 2% Lidocaine as local anesthetic, under direct  ultrasound visualization, a 14 gauge spring-loaded device was used to perform biopsy of hypoechoic area of left breast 9:30 o'clock using a medial approach. At the conclusion of the procedure a ribbon tissue marker clip was deployed into the biopsy cavity. Follow up 2 view mammogram was performed and dictated separately.  IMPRESSION: Ultrasound guided biopsies of left breast. No apparent complications.  Electronically Signed: By: Abelardo Diesel M.D. On: 12/13/2014 16:24    ASSESSMENT & PLAN:  59 yo female, with PMH of  Hypertension and a mild IBS , otherwise very fit and healthy postmenopausal woman , who was found to have left breast cancer by screening mammogram.  1. CT3N0M0,  Stage IIB,  Invasive lobular carcinoma,  Strongly ER and PR positive, HER-2 negative, Ki67 33-42%, and lobular carcinoma in situ -  I reviewed her images findings and biopsy results  In great details with patient and her husband. - she is likely need a mastectomy giving the large size of the tumor,  She was seen by Dr. Dalbert Batman today. - giving the large size of the tumor, neoadjuvant therapy is  Preferred to achieve complete surgical resection.   -I discussed her Oncotype results. Her recurrence score is 27, which predicts 18% of recurrence with with tamoxifen alone. It is intermediate risk. Although the benefit of chemotherapy in intermediate risk group remains controversial, given her relatively high score in the intermediate or risk of range, and large size of the tumor, I recommend new adjuvant chemotherapy with 6 cycles of TC (Cytoxan and docetaxel). -I discussed this with her surgeon Dr. Dalbert Batman and also who agrees with neoadjuvant chemotherapy. -Chemotherapy consent: Side effects including but does not not limited to, fatigue, nausea, vomiting, diarrhea, hair loss, neuropathy, fluid retention, renal and kidney dysfunction, neutropenic fever, needed for blood transfusion, bleeding, were discussed with patient in great detail.  She agrees to proceed. -The goal of chemotherapy is curative - Giving the strong ER/PR positivity, I would recommend adjuvant endocrine therapy with aromatase inhibitor. - she is likely also need adjuvant radiation, she was seen by Dr. Pablo Ledger.   2.  Hypertension - continue medication. Follow-up of his primary care physician  Plan: -Chemotherapy class tomorrow  -Tentatively start chemotherapy on the week of April 18 with TC.   Patient and her husband had multiple questions,  And I answered to their satisfaction. The patient knows to call the clinic with any problems, questions or concerns. I spent 30 minutes counseling the patient face to face. The total time spent in the appointment was 40 minutes and more than 50% was on counseling.     Truitt Merle, MD 01/09/2015 6:45 PM

## 2015-01-13 ENCOUNTER — Other Ambulatory Visit: Payer: Self-pay | Admitting: Hematology

## 2015-01-13 ENCOUNTER — Other Ambulatory Visit: Payer: Self-pay | Admitting: Family Medicine

## 2015-01-13 NOTE — Telephone Encounter (Signed)
Med filled.  

## 2015-01-14 ENCOUNTER — Encounter: Payer: Self-pay | Admitting: *Deleted

## 2015-01-14 ENCOUNTER — Other Ambulatory Visit: Payer: Self-pay | Admitting: Hematology

## 2015-01-14 DIAGNOSIS — C50212 Malignant neoplasm of upper-inner quadrant of left female breast: Secondary | ICD-10-CM

## 2015-01-14 MED ORDER — ONDANSETRON HCL 8 MG PO TABS: 8.0000 mg | ORAL_TABLET | Freq: Two times a day (BID) | ORAL | Status: DC

## 2015-01-14 NOTE — Progress Notes (Signed)
Spoke to pt concerning her 1st TC treatment. Pt request to have it on Thursday 01/23/15. Schedule pt for lab and Dr. Burr Medico at 1015 on 4/21. Sent POF for pt to be scheduled for 1st TC on 4/21 after her appt with Dr. Burr Medico. Pt request to have subsequent TC treatments on Fridays.  Will send antiemetics to [facility].

## 2015-01-16 ENCOUNTER — Ambulatory Visit: Payer: BLUE CROSS/BLUE SHIELD | Admitting: Hematology

## 2015-01-23 ENCOUNTER — Ambulatory Visit: Payer: BLUE CROSS/BLUE SHIELD | Admitting: Hematology

## 2015-01-23 ENCOUNTER — Other Ambulatory Visit: Payer: BLUE CROSS/BLUE SHIELD

## 2015-01-24 ENCOUNTER — Encounter: Payer: Self-pay | Admitting: Hematology

## 2015-01-24 ENCOUNTER — Ambulatory Visit (HOSPITAL_BASED_OUTPATIENT_CLINIC_OR_DEPARTMENT_OTHER): Payer: BLUE CROSS/BLUE SHIELD | Admitting: Hematology

## 2015-01-24 ENCOUNTER — Encounter: Payer: Self-pay | Admitting: *Deleted

## 2015-01-24 ENCOUNTER — Ambulatory Visit (HOSPITAL_BASED_OUTPATIENT_CLINIC_OR_DEPARTMENT_OTHER): Payer: BLUE CROSS/BLUE SHIELD

## 2015-01-24 ENCOUNTER — Other Ambulatory Visit (HOSPITAL_BASED_OUTPATIENT_CLINIC_OR_DEPARTMENT_OTHER): Payer: BLUE CROSS/BLUE SHIELD

## 2015-01-24 VITALS — BP 132/74 | HR 79 | Temp 98.3°F | Resp 19 | Ht 65.0 in | Wt 171.9 lb

## 2015-01-24 VITALS — BP 111/73 | HR 60 | Temp 98.1°F | Resp 18

## 2015-01-24 DIAGNOSIS — C50812 Malignant neoplasm of overlapping sites of left female breast: Secondary | ICD-10-CM

## 2015-01-24 DIAGNOSIS — C50212 Malignant neoplasm of upper-inner quadrant of left female breast: Secondary | ICD-10-CM

## 2015-01-24 DIAGNOSIS — I10 Essential (primary) hypertension: Secondary | ICD-10-CM | POA: Diagnosis not present

## 2015-01-24 DIAGNOSIS — Z5111 Encounter for antineoplastic chemotherapy: Secondary | ICD-10-CM

## 2015-01-24 LAB — CBC WITH DIFFERENTIAL/PLATELET
BASO%: 0.2 % (ref 0.0–2.0)
Basophils Absolute: 0 10*3/uL (ref 0.0–0.1)
EOS%: 0.9 % (ref 0.0–7.0)
Eosinophils Absolute: 0.1 10*3/uL (ref 0.0–0.5)
HCT: 42.2 % (ref 34.8–46.6)
HGB: 14.1 g/dL (ref 11.6–15.9)
LYMPH%: 24.4 % (ref 14.0–49.7)
MCH: 30.5 pg (ref 25.1–34.0)
MCHC: 33.4 g/dL (ref 31.5–36.0)
MCV: 91.3 fL (ref 79.5–101.0)
MONO#: 0.4 10*3/uL (ref 0.1–0.9)
MONO%: 6.6 % (ref 0.0–14.0)
NEUT#: 4 10*3/uL (ref 1.5–6.5)
NEUT%: 67.9 % (ref 38.4–76.8)
PLATELETS: 271 10*3/uL (ref 145–400)
RBC: 4.62 10*6/uL (ref 3.70–5.45)
RDW: 13.5 % (ref 11.2–14.5)
WBC: 5.9 10*3/uL (ref 3.9–10.3)
lymph#: 1.4 10*3/uL (ref 0.9–3.3)

## 2015-01-24 LAB — COMPREHENSIVE METABOLIC PANEL (CC13)
ALBUMIN: 3.9 g/dL (ref 3.5–5.0)
ALK PHOS: 68 U/L (ref 40–150)
ALT: 24 U/L (ref 0–55)
AST: 21 U/L (ref 5–34)
Anion Gap: 12 mEq/L — ABNORMAL HIGH (ref 3–11)
BILIRUBIN TOTAL: 0.7 mg/dL (ref 0.20–1.20)
BUN: 12.9 mg/dL (ref 7.0–26.0)
CO2: 24 mEq/L (ref 22–29)
Calcium: 9.2 mg/dL (ref 8.4–10.4)
Chloride: 104 mEq/L (ref 98–109)
Creatinine: 0.7 mg/dL (ref 0.6–1.1)
GLUCOSE: 104 mg/dL (ref 70–140)
Potassium: 3.6 mEq/L (ref 3.5–5.1)
Sodium: 140 mEq/L (ref 136–145)
Total Protein: 7.3 g/dL (ref 6.4–8.3)

## 2015-01-24 MED ORDER — SODIUM CHLORIDE 0.9 % IV SOLN
600.0000 mg/m2 | Freq: Once | INTRAVENOUS | Status: AC
  Administered 2015-01-24: 1140 mg via INTRAVENOUS
  Filled 2015-01-24: qty 57

## 2015-01-24 MED ORDER — SODIUM CHLORIDE 0.9 % IV SOLN
Freq: Once | INTRAVENOUS | Status: AC
  Administered 2015-01-24: 13:00:00 via INTRAVENOUS
  Filled 2015-01-24: qty 8

## 2015-01-24 MED ORDER — DOCETAXEL CHEMO INJECTION 160 MG/16ML
75.0000 mg/m2 | Freq: Once | INTRAVENOUS | Status: AC
  Administered 2015-01-24: 140 mg via INTRAVENOUS
  Filled 2015-01-24: qty 14

## 2015-01-24 MED ORDER — SODIUM CHLORIDE 0.9 % IV SOLN
Freq: Once | INTRAVENOUS | Status: AC
  Administered 2015-01-24: 12:00:00 via INTRAVENOUS

## 2015-01-24 NOTE — Progress Notes (Unsigned)
Met with pt during 1st chemo infusion. Relate she is doing well. Denies needs at this time. Encourage pt to call with questions or concerns. Received verbal understanding. Confirmed future appts.

## 2015-01-24 NOTE — Progress Notes (Signed)
Attempted to sign pt up for the [place] First Step program.  Pt's spouse was very leery of the program and didn't want to sign up today.  I reassured them that I wasn't signing them up for something that wouldn't benefit them.  They have my card if they would like to sign up in the future.

## 2015-01-24 NOTE — Progress Notes (Signed)
[facility]  Telephone:([phone]) [phone] Fax:([phone]) [place] Note   Patient Care Team: Midge Minium, MD as PCP - General (Family Medicine) Fanny Skates, MD as Consulting Physician (General Surgery) Truitt Merle, MD as Consulting Physician (Hematology) Thea Silversmith, MD as Consulting Physician (Radiation Oncology) Rockwell Germany, RN as Registered Nurse Mauro Kaufmann, RN as Registered Nurse Holley Bouche, NP as Nurse Practitioner (Nurse Practitioner)   CHIEF COMPLAINTS/PURPOSE OF CONSULTATION:  Follow up breast cancer  Oncology History   Breast cancer of upper-inner quadrant of left female breast   Staging form: Breast, AJCC 7th Edition     Clinical stage from 12/25/2014: Stage IIB (T3, N0, M0) - Unsigned       Staging comments: Staged at breast conference on 3.23.16        Breast cancer of upper-inner quadrant of left female breast   12/09/2014 Breast US ultrasound is performed, showing a diffuse, large, irregular, hypoechoic mass with shadowing located within the superior left breast extending from the 9:30 o'clock position to approximately the 2:30 o'clock position and centered at approximately the    12/13/2014 Oncotype testing RS 27, predicted recurrent risk of 18% with tamoxifen alone   12/13/2014 Pathology Results Breast, left, needle core biopsy, 11:30 o'clock - INVASIVE MAMMARY CARCINOMA, SEE COMMENT. - MAMMARY CARCINOMA IN SITU. 2. Breast, left, needle core biopsy, 9:30 o'clock - INVASIVE MAMMARY CARCINOMA, SEE COMMENT   12/13/2014 Receptors her2 Estrogen Receptor: 99%, POSITIVE, STRONG STAINING INTENSITY Progesterone Receptor: 43%, POSITIVE, MODERATE STAINING INTENSITY Proliferation Marker Ki67: 42%  HER-2/NEU BY CISH - NEGATIVE   12/20/2014 Initial Diagnosis Breast cancer of upper-inner quadrant of left female breast   12/23/2014 Breast MRI there is extensive abnormal enhancement throughout much of the left breast suspicious for  additional areas of malignancy. Overall area of this abnormal enhancement covers 8.5 cm x 7 cm x 7.1 cm. Most discrete area of abnormal enhancement lies in the up   01/24/2015 -  Chemotherapy docetaxel 63m/m2, cyclophosphamide 600 mg/m, on day 1 every 21 days.     HISTORY OF PRESENTING ILLNESS:  Emily Graves59y.o. female is here because of newly diagnosed left breast cancer.  This was found by screening mammogram. Her prior screening mammogram was in November 2014 which was negative. Her screening mammogram on 11/25/2014 showed a possible mass and distortion in the left breast. She further underwent diagnostic mammogram and ultrasound on 12/09/2014, which showed a diffuse large irregular hypoechogenic mass extending from 9:30 o'clock position 2-30 o'clock position. Biopsy of this large mass at 2 different positions showed invasive lobular carcinoma.  She denies any symptoms. She feels well overall. She denies any pain, cough, dyspnea, or any GI symptoms. 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 No recent weight loss. No change of her appetite.   She had right breast biopsy was benign. She always has lumpy breast which has not changed per patient. She has IBS, she has dirrhea which is usually triggered by stress, and she takes Imodium as needed.    INTERIM HISTORY: CArbie Cookeyreturns for follow-up and first cycle chemotherapy. She feels well, denies any complaints, or any new changes. She has good appetite and energy level, still works full-time.  MEDICAL HISTORY:  Past Medical History  Diagnosis Date  . Chicken pox   . Hypertension   . Hyperlipidemia   . IBS (irritable bowel syndrome)   . Breast cancer of upper-inner  quadrant of left female breast 12/20/2014    SURGICAL HISTORY: Past Surgical History  Procedure Laterality Date  . Laser ablation of the cervix  1991    SOCIAL HISTORY: History   Social History  . Marital Status: Married      Spouse Name: N/A  . Number of Children: 0  . Years of Education: N/A   Occupational History  . 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 Smoking status: Never Smoker   . Smokeless tobacco: Not on file  . Alcohol Use: Yes     Comment: social   . Drug Use: No  . Sexual Activity: Not on file   Other Topics Concern  . Not on file   Social History Narrative   GYN HISTORY  Menarchal: 11 LMP: 03/2013  Contraceptive: no HRT: no  G0P0:    FAMILY HISTORY: Family History  Problem Relation Age of Onset  . Hypertension Mother   . Sudden death Father 30    bleeding   . Cancer Maternal Uncle 60    unknown cancer   . Cancer Paternal Uncle 35    unknown cancer     ALLERGIES:  has No Known Allergies.  MEDICATIONS:  Current Outpatient Prescriptions  Medication Sig Dispense Refill  . atorvastatin (LIPITOR) 40 MG tablet TAKE 1 TABLET EVERY NIGHT AT BEDTIME 90 tablet 1  . dicyclomine (BENTYL) 20 MG tablet TAKE 1 TABLET BY MOUTH EVERY 6 HOURS (Patient taking differently: TAKE 1 TABLET BY MOUTH EVERY 6 HOURS AS NEEDED) 60 tablet 3  . lisinopril-hydrochlorothiazide (PRINZIDE,ZESTORETIC) 20-12.5 MG per tablet TAKE 1 TABLET DAILY 90 tablet 1  . ondansetron (ZOFRAN) 8 MG tablet Take 1 tablet (8 mg total) by mouth 2 (two) times daily. Start the day after chemo for 3 days. Then take as needed for nausea or vomiting. 30 tablet 1  . potassium chloride SA (K-DUR,KLOR-CON) 20 MEQ tablet TAKE 1 TABLET DAILY 90 tablet 1  . Vitamin D, Ergocalciferol, (DRISDOL) 50000 UNITS CAPS capsule Take 1 capsule (50,000 Units total) by mouth every 7 (seven) days. 12 capsule 0   No current facility-administered medications for this visit.    REVIEW OF SYSTEMS:   Constitutional: Denies fevers, chills or abnormal night sweats Eyes: Denies blurriness of vision, double vision or watery eyes Ears, nose, mouth, throat, and face: Denies mucositis or sore throat Respiratory: Denies  cough, dyspnea or wheezes Cardiovascular: Denies palpitation, chest discomfort or lower extremity swelling Gastrointestinal:  Denies nausea, heartburn or change in bowel habits Skin: Denies abnormal skin rashes Lymphatics: Denies new lymphadenopathy or easy bruising Neurological:Denies numbness, tingling or new weaknesses Behavioral/Psych: Mood is stable, no new changes  All other systems were reviewed with the patient and are negative.  PHYSICAL EXAMINATION: ECOG PERFORMANCE STATUS: 0 - Asymptomatic  Filed Vitals:   01/24/15 1035  BP: 132/74  Pulse: 79  Temp: 98.3 F (36.8 C)  Resp: 19   Filed Weights   01/24/15 1035  Weight: 171 lb 14.4 oz (77.973 kg)    GENERAL:alert, no distress and comfortable SKIN: skin color, texture, turgor are normal, no rashes or significant lesions EYES: normal, conjunctiva are pink and non-injected, sclera clear OROPHARYNX:no exudate, no erythema and lips, buccal mucosa, and tongue normal  NECK: supple, thyroid normal size, non-tender, without nodularity LYMPH:  no palpable lymphadenopathy in the cervical, axillary or inguinal LUNGS: clear to auscultation and percussion with normal breathing effort HEART: regular rate & rhythm and no murmurs and  no lower extremity edema ABDOMEN:abdomen soft, non-tender and normal bowel sounds Musculoskeletal:no cyanosis of digits and no clubbing  PSYCH: alert & oriented x 3 with fluent speech NEURO: no focal motor/sensory deficits Breasts: Breast inspection showed them to be symmetrical with no nipple discharge. There is a firm mass like area measuring 9.5X6cm in the left upper mid and outer quadrant of left breast, which has not changed per patient, palpitation of the right breast and axilla revealed no obvious mass that I could appreciate.   LABORATORY DATA:  I have reviewed the data as listed Lab Results  Component Value Date   WBC 5.9 01/24/2015   HGB 14.1 01/24/2015   HCT 42.2 01/24/2015   MCV 91.3  01/24/2015   PLT 271 01/24/2015    Recent Labs  03/18/14 1504 11/15/14 1151 12/25/14 0838 01/24/15 1006  NA 136 138 139 140  K 3.6 3.7 3.6 3.6  CL 100 101  --   --   CO2 28 31 27 24   GLUCOSE 87 97 113 104  BUN 12 19 10.6 12.9  CREATININE 0.7 0.75 0.8 0.7  CALCIUM 9.8 9.8 9.8 9.2  PROT 7.8 7.7 7.6 7.3  ALBUMIN 4.2 4.2 3.9 3.9  AST 20 20 18 21   ALT 19 25 21 24   ALKPHOS 56 64 66 68  BILITOT 0.9 0.9 0.96 0.70  BILIDIR 0.1 0.2  --   --    PATHOLOGY REPORT 12/13/2014 1. Breast, left, needle core biopsy, 11:30 o'clock - INVASIVE MAMMARY CARCINOMA, SEE COMMENT. - MAMMARY CARCINOMA IN SITU. 2. Breast, left, needle core biopsy, 9:30 o'clock - INVASIVE MAMMARY CARCINOMA, SEE COMMENT. Microscopic Comment 1. and 2. There is absence of myoepithelial layer demonstrated (smooth muscle myosin heavy chain, calponin, and p63 immunostains). Both the in situ and invasive carcinoma demonstrate absence of E-cadherin expression; supporting a lobular phenotype. Breast prognostic studies are pending and will be reported in an addendum. The case was reviewed with Dr. Avis Epley who concurs.  2. PROGNOSTIC INDICATORS - ACIS Results: IMMUNOHISTOCHEMICAL AND MORPHOMETRIC ANALYSIS BY THE AUTOMATED CELLULAR IMAGING SYSTEM (ACIS) (BLOCK 2A) Estrogen Receptor: 99%, POSITIVE, STRONG STAINING INTENSITY Progesterone Receptor: 71%, POSITIVE, STRONG STAINING INTENSITY Proliferation Marker Ki67: 33%  CHROMOGENIC IN-SITU HYBRIDIZATION Results: 1A HER-2/NEU BY CISH - NEGATIVE. RESULT RATIO OF HER2: CEP 17 SIGNALS 1.84 AVERAGE HER2 COPY NUMBER PER CELL 3.50  1. PROGNOSTIC INDICATORS - ACIS Results: IMMUNOHISTOCHEMICAL AND MORPHOMETRIC ANALYSIS BY THE AUTOMATED CELLULAR IMAGING SYSTEM (ACIS) (BLOCK 1A) Estrogen Receptor: 99%, POSITIVE, STRONG STAINING INTENSITY Progesterone Receptor: 43%, POSITIVE, MODERATE STAINING INTENSITY Proliferation Marker Ki67: 42%  RADIOGRAPHIC STUDIES: I have personally  reviewed the radiological images as listed and agreed with the findings in the report.  Mr Breast Bilateral W Wo Contrast 12/23/2014   FINDINGS: Breast composition: c. Heterogeneous fibroglandular tissue.  Background parenchymal enhancement: Moderate.  Right breast: No suspicious mass or abnormal enhancement. 12 mm oval cyst in the central upper breast. Artifact from the previous biopsy lies lateral to and inferior to the breast cyst.  Left breast: Extensive, heterogeneous abnormal enhancement is seen throughout much of the left breast. Susceptibility artifact from the biopsy clip is evident along the anterior central margin of this abnormal enhancement. Susceptibility artifact from another biopsy clip is seen deep to the nipple where there is a focal area of irregular enhancement. Overall extent of this enhancement measures 8.5 cm x 7.1 cm x 7 cm. It extends 3 anterior posterior third of the breast. The largest area of abnormal enhancement is in the  middle to posterior upper outer quadrant. This is in the location of the amorphic calcifications and distortion mammographically. Enhancement kinetics from this area of abnormal enhancement areas from plateau to minimal washout.  Lymph nodes: No abnormal appearing lymph nodes.  Ancillary findings:  None.   IMPRESSION: 1. In addition to the biopsy proven area of left breast carcinoma, there is extensive abnormal enhancement throughout much of the left breast suspicious for additional areas of malignancy. Overall area of this abnormal enhancement covers 8.5 cm x 7 cm x 7.1 cm. Most discrete area of abnormal enhancement lies in the upper outer quadrant where mammographically there is architectural distortion and the amorphic calcifications. 2. No evidence of right breast malignancy. 3. No suspicious or enlarged lymph nodes.  RECOMMENDATION: Unless left mastectomy is planned, biopsy of the upper outer quadrant of the left breast (where there is distortion and pleomorphic  calcifications, as well as the dominant area of the abnormal MRI enhancement) under stereotactic guidance would be recommended to confirm extent of disease.  BI-RADS CATEGORY  5: Highly suggestive of malignancy.   Electronically Signed   By: Lajean Manes M.D.   On: 12/23/2014 11:23    Mm Diag Breast Tomo Uni Left 12/09/2014  FINDINGS: There is a large area of distortion located within the superior left breast extending from approximately the 10 o'clock position to the 2 o'clock position. There are numerous variably sized and shaped calcifications seen within the superior left breast which span 8 cm.  Mammographic images were processed with CAD.  On physical exam, there is a large, firm, palpable mass occupying the majority of the superior portion of the left breast extending from approximately the 9:30 o'clock position to the 2:30 o'clock position centered at approximately the 11:30 o'clock position. By physical examination this measures approximately 10 cm in size.  There is no palpable left axillary adenopathy.  Targeted ultrasound is performed, showing a diffuse, large, irregular, hypoechoic mass with shadowing located within the superior left breast extending from the 9:30 o'clock position to approximately the 2:30 o'clock position and centered at approximately the 11:30 o'clock position 4 cm from the nipple. This corresponds to the large palpable mass. This is difficult to size accurately by ultrasound but measures approximately 8 cm in greatest dimension. This is a suspicious mass. Tissue sampling via ultrasound-guided core biopsy of 2 separate areas is recommended (to determine extent of disease).  Ultrasound of the left axilla demonstrates no adenopathy and normal axillary contents.  IMPRESSION: Large palpable mass associated with distortion, shadowing, and numerous microcalcifications. Ultrasound-guided core biopsy of 2 sites is recommended. This is scheduled for Friday 12/13/2014.  RECOMMENDATION: Left  breast ultrasound-guided core biopsies as discussed above.  I have discussed the findings and recommendations with the patient. Results were also provided in writing at the conclusion of the visit. If applicable, a reminder letter will be sent to the patient regarding the next appointment.  BI-RADS CATEGORY  5: Highly suggestive of malignancy.   Electronically Signed   By: Altamese Cabal M.D.   On: 12/09/2014 16:34   [place] Lt Breast Bx W Loc Dev 1st Lesion Img Bx Spec US Guide  12/18/2014   ADDENDUM REPORT: 12/18/2014 13:16  ADDENDUM: The pathology revealed invasive mammary carcinoma at both the 9:30 o'clock and 11:30 o'clock biopsy sites. This is found to be concordant with imaging findings. I discussed the results over the phone with the patient. The patient states she is doing well post biopsy without complications. Recommend MRI of the  breast and [facility]. The patient was given the appointment dates for both.   Electronically Signed   By: Abelardo Diesel M.D.   On: 12/18/2014 13:16   12/18/2014   CLINICAL DATA:  Hypoechoic lesions left breast for biopsy.  EXAM: ULTRASOUND GUIDED LEFT BREAST CORE NEEDLE BIOPSIES  COMPARISON:  Previous exam(s).  FINDINGS: I met with the patient and we discussed the procedure of ultrasound-guided biopsy, including benefits and alternatives. We discussed the high likelihood of a successful procedure. We discussed the risks of the procedure, including infection, bleeding, tissue injury, clip migration, and inadequate sampling. Informed written consent was given. The usual time-out protocol was performed immediately prior to the procedure.  Using sterile technique and 2% Lidocaine as local anesthetic, under direct ultrasound visualization, a 14 gauge spring-loaded device was used to perform biopsy of hypoechoic area of left breast 11:30 o'clock using a medial approach. At the conclusion of the procedure a heart shaped tissue marker clip was deployed into the biopsy cavity. Follow  up 2 view mammogram was performed and dictated separately.  Using sterile technique and 2% Lidocaine as local anesthetic, under direct ultrasound visualization, a 14 gauge spring-loaded device was used to perform biopsy of hypoechoic area of left breast 9:30 o'clock using a medial approach. At the conclusion of the procedure a ribbon tissue marker clip was deployed into the biopsy cavity. Follow up 2 view mammogram was performed and dictated separately.  IMPRESSION: Ultrasound guided biopsies of left breast. No apparent complications.  Electronically Signed: By: Abelardo Diesel M.D. On: 12/13/2014 16:24    ASSESSMENT & PLAN:  59 yo female, with PMH of  Hypertension and a mild IBS , otherwise very fit and healthy postmenopausal woman , who was found to have left breast cancer by screening mammogram.  1. CT3N0M0,  Stage IIB,  Invasive lobular carcinoma,  Strongly ER and PR positive, HER-2 negative, Ki67 33-42%, and lobular carcinoma in situ - I reviewed her images findings and biopsy results  In great details with patient and her husband. - she is likely need a mastectomy giving the large size of the tumor,  She was seen by Dr. Dalbert Batman. - giving the large size of the tumor, neoadjuvant therapy is  Preferred to achieve complete surgical resection.   -I discussed her Oncotype results. Her recurrence score is 27, which predicts 18% of recurrence with with tamoxifen alone. It is intermediate risk. Although the benefit of chemotherapy in intermediate risk group remains controversial, given her relatively high score in the intermediate or risk of range, and large size of the tumor, I recommend new adjuvant chemotherapy with 6 cycles of TC (Cytoxan and docetaxel). -I discussed this with her surgeon Dr. Dalbert Batman and also who agrees with neoadjuvant chemotherapy. -The goal of chemotherapy is curative - Giving the strong ER/PR positivity, I would recommend adjuvant endocrine therapy with aromatase inhibitor. - she is  likely also need adjuvant radiation, she was seen by Dr. Pablo Ledger.  -I reviewed, normal, we'll proceed cycle 1 TC today   2.  Hypertension - continue medication. Follow-up of his primary care physician  Plan: -cycle 1 TC today -return on 5/5 for toxicity check up -cycle 2 in 3 weeks    Patient and her husband had multiple questions,  And I answered to their satisfaction. The patient knows to call the clinic with any problems, questions or concerns.  I spent 20 minutes counseling the patient face to face. The total time spent in the appointment was 25 minutes  and more than 50% was on counseling.     Truitt Merle, MD 01/24/2015 6:29 PM

## 2015-01-24 NOTE — Patient Instructions (Signed)
[facility] Discharge Instructions for Patients Receiving Chemotherapy  Today you received the following chemotherapy agents Taxotere and Cytoxan.  To help prevent nausea and vomiting after your treatment, we encourage you to take your nausea medication Take 1 tablet (8 mg total) by mouth 2 (two) times daily. Start the day after chemo for 3 days. Then take as needed for nausea or vomiting.   If you develop nausea and vomiting that is not controlled by your nausea medication, call the clinic.   BELOW ARE SYMPTOMS THAT SHOULD BE REPORTED IMMEDIATELY:  *FEVER GREATER THAN 100.5 F  *CHILLS WITH OR WITHOUT FEVER  NAUSEA AND VOMITING THAT IS NOT CONTROLLED WITH YOUR NAUSEA MEDICATION  *UNUSUAL SHORTNESS OF BREATH  *UNUSUAL BRUISING OR BLEEDING  TENDERNESS IN MOUTH AND THROAT WITH OR WITHOUT PRESENCE OF ULCERS  *URINARY PROBLEMS  *BOWEL PROBLEMS  UNUSUAL RASH Items with * indicate a potential emergency and should be followed up as soon as possible.  Feel free to call the clinic you have any questions or concerns. The clinic phone number is [phone].  Please show the [facility] at check-in to the Emergency Department and triage nurse.

## 2015-01-25 ENCOUNTER — Encounter: Payer: Self-pay | Admitting: Hematology

## 2015-01-27 ENCOUNTER — Ambulatory Visit (HOSPITAL_BASED_OUTPATIENT_CLINIC_OR_DEPARTMENT_OTHER): Payer: BLUE CROSS/BLUE SHIELD

## 2015-01-27 ENCOUNTER — Encounter: Payer: Self-pay | Admitting: Hematology

## 2015-01-27 VITALS — BP 123/75 | HR 70 | Temp 98.5°F

## 2015-01-27 DIAGNOSIS — Z5189 Encounter for other specified aftercare: Secondary | ICD-10-CM | POA: Diagnosis not present

## 2015-01-27 DIAGNOSIS — C50212 Malignant neoplasm of upper-inner quadrant of left female breast: Secondary | ICD-10-CM

## 2015-01-27 DIAGNOSIS — C50812 Malignant neoplasm of overlapping sites of left female breast: Secondary | ICD-10-CM

## 2015-01-27 MED ORDER — PEGFILGRASTIM INJECTION 6 MG/0.6ML ~~LOC~~
6.0000 mg | PREFILLED_SYRINGE | Freq: Once | SUBCUTANEOUS | Status: AC
  Administered 2015-01-27: 6 mg via SUBCUTANEOUS
  Filled 2015-01-27: qty 0.6

## 2015-01-27 NOTE — Progress Notes (Signed)
Enrolled pt in the [employer]ul[employer] First Step program.  Faxed signed form and activated card today.  °

## 2015-01-27 NOTE — Patient Instructions (Signed)
Pegfilgrastim injection What is this medicine? PEGFILGRASTIM (peg fil GRA stim) is a long-acting granulocyte colony-stimulating factor that stimulates the growth of neutrophils, a type of white blood cell important in the body's fight against infection. It is used to reduce the incidence of fever and infection in patients with certain types of cancer who are receiving chemotherapy that affects the bone marrow. This medicine may be used for other purposes; ask your health care provider or pharmacist if you have questions. COMMON BRAND NAME(S): Neulasta What should I tell my health care provider before I take this medicine? They need to know if you have any of these conditions: -latex allergy -ongoing radiation therapy -sickle cell disease -skin reactions to acrylic adhesives (On-Body Injector only) -an unusual or allergic reaction to pegfilgrastim, filgrastim, other medicines, foods, dyes, or preservatives -pregnant or trying to get pregnant -breast-feeding How should I use this medicine? This medicine is for injection under the skin. If you get this medicine at home, you will be taught how to prepare and give the pre-filled syringe or how to use the On-body Injector. Refer to the patient Instructions for Use for detailed instructions. Use exactly as directed. Take your medicine at regular intervals. Do not take your medicine more often than directed. It is important that you put your used needles and syringes in a special sharps container. Do not put them in a trash can. If you do not have a sharps container, call your pharmacist or healthcare provider to get one. Talk to your pediatrician regarding the use of this medicine in children. Special care may be needed. Overdosage: If you think you have taken too much of this medicine contact a poison control center or emergency room at once. NOTE: This medicine is only for you. Do not share this medicine with others. What if I miss a dose? It is  important not to miss your dose. Call your doctor or health care professional if you miss your dose. If you miss a dose due to an On-body Injector failure or leakage, a new dose should be administered as soon as possible using a single prefilled syringe for manual use. What may interact with this medicine? Interactions have not been studied. Give your health care provider a list of all the medicines, herbs, non-prescription drugs, or dietary supplements you use. Also tell them if you smoke, drink alcohol, or use illegal drugs. Some items may interact with your medicine. This list may not describe all possible interactions. Give your health care provider a list of all the medicines, herbs, non-prescription drugs, or dietary supplements you use. Also tell them if you smoke, drink alcohol, or use illegal drugs. Some items may interact with your medicine. What should I watch for while using this medicine? You may need blood work done while you are taking this medicine. If you are going to need a MRI, CT scan, or other procedure, tell your doctor that you are using this medicine (On-Body Injector only). What side effects may I notice from receiving this medicine? Side effects that you should report to your doctor or health care professional as soon as possible: -allergic reactions like skin rash, itching or hives, swelling of the face, lips, or tongue -dizziness -fever -pain, redness, or irritation at site where injected -pinpoint red spots on the skin -shortness of breath or breathing problems -stomach or side pain, or pain at the shoulder -swelling -tiredness -trouble passing urine Side effects that usually do not require medical attention (report to your doctor   or health care professional if they continue or are bothersome): -bone pain -muscle pain This list may not describe all possible side effects. Call your doctor for medical advice about side effects. You may report side effects to FDA at  1-800-FDA-1088. Where should I keep my medicine? Keep out of the reach of children. Store pre-filled syringes in a refrigerator between 2 and 8 degrees C (36 and 46 degrees F). Do not freeze. Keep in carton to protect from light. Throw away this medicine if it is left out of the refrigerator for more than 48 hours. Throw away any unused medicine after the expiration date. NOTE: This sheet is a summary. It may not cover all possible information. If you have questions about this medicine, talk to your doctor, pharmacist, or health care provider.  2015, [employer]vier/Gold Standard. (2013-12-20 16:14:05)  

## 2015-01-28 ENCOUNTER — Encounter: Payer: Self-pay | Admitting: *Deleted

## 2015-01-28 NOTE — Progress Notes (Signed)
[place] Social Work  Clinical Social Work was referred by Therapist, sports to review and complete healthcare advance directives.  Clinical Social Worker met with patient and spouse in infusion room.  The patient designated spouse Emily Griffith as their primary healthcare agent and no secondary agent.  Patient also completed healthcare living will.    Clinical Social Worker notarized documents and made copies for patient/family. Clinical Social Worker will send documents to medical records to be scanned into patient's chart. Clinical Social Worker encouraged patient/family to contact with any additional questions or concerns.  Emily Griffith, MSW, [place] Worker [facility] [phone]

## 2015-02-06 ENCOUNTER — Telehealth: Payer: Self-pay | Admitting: Hematology

## 2015-02-06 ENCOUNTER — Ambulatory Visit (HOSPITAL_BASED_OUTPATIENT_CLINIC_OR_DEPARTMENT_OTHER): Payer: BLUE CROSS/BLUE SHIELD | Admitting: Nurse Practitioner

## 2015-02-06 ENCOUNTER — Other Ambulatory Visit (HOSPITAL_BASED_OUTPATIENT_CLINIC_OR_DEPARTMENT_OTHER): Payer: BLUE CROSS/BLUE SHIELD

## 2015-02-06 ENCOUNTER — Telehealth: Payer: Self-pay | Admitting: Nurse Practitioner

## 2015-02-06 VITALS — BP 143/92 | HR 72 | Temp 97.5°F | Resp 19 | Ht 65.0 in | Wt 168.4 lb

## 2015-02-06 DIAGNOSIS — C50812 Malignant neoplasm of overlapping sites of left female breast: Secondary | ICD-10-CM

## 2015-02-06 DIAGNOSIS — Z170 Estrogen receptor positive status [ER+]: Secondary | ICD-10-CM

## 2015-02-06 DIAGNOSIS — I10 Essential (primary) hypertension: Secondary | ICD-10-CM | POA: Diagnosis not present

## 2015-02-06 DIAGNOSIS — R51 Headache: Secondary | ICD-10-CM

## 2015-02-06 DIAGNOSIS — C50212 Malignant neoplasm of upper-inner quadrant of left female breast: Secondary | ICD-10-CM

## 2015-02-06 LAB — CBC WITH DIFFERENTIAL/PLATELET
BASO%: 0.4 % (ref 0.0–2.0)
Basophils Absolute: 0.1 10*3/uL (ref 0.0–0.1)
EOS ABS: 0 10*3/uL (ref 0.0–0.5)
EOS%: 0.1 % (ref 0.0–7.0)
HCT: 38.2 % (ref 34.8–46.6)
HGB: 13 g/dL (ref 11.6–15.9)
LYMPH%: 10.9 % — AB (ref 14.0–49.7)
MCH: 30.9 pg (ref 25.1–34.0)
MCHC: 34 g/dL (ref 31.5–36.0)
MCV: 90.7 fL (ref 79.5–101.0)
MONO#: 1.2 10*3/uL — AB (ref 0.1–0.9)
MONO%: 5.6 % (ref 0.0–14.0)
NEUT#: 17.9 10*3/uL — ABNORMAL HIGH (ref 1.5–6.5)
NEUT%: 83 % — AB (ref 38.4–76.8)
Platelets: 180 10*3/uL (ref 145–400)
RBC: 4.21 10*6/uL (ref 3.70–5.45)
RDW: 13.1 % (ref 11.2–14.5)
WBC: 21.6 10*3/uL — AB (ref 3.9–10.3)
lymph#: 2.4 10*3/uL (ref 0.9–3.3)

## 2015-02-06 LAB — COMPREHENSIVE METABOLIC PANEL (CC13)
ALK PHOS: 92 U/L (ref 40–150)
ALT: 25 U/L (ref 0–55)
ANION GAP: 14 meq/L — AB (ref 3–11)
AST: 21 U/L (ref 5–34)
Albumin: 3.6 g/dL (ref 3.5–5.0)
BILIRUBIN TOTAL: 0.5 mg/dL (ref 0.20–1.20)
BUN: 8.5 mg/dL (ref 7.0–26.0)
CO2: 22 mEq/L (ref 22–29)
Calcium: 9.3 mg/dL (ref 8.4–10.4)
Chloride: 105 mEq/L (ref 98–109)
Creatinine: 0.8 mg/dL (ref 0.6–1.1)
EGFR: 86 mL/min/{1.73_m2} — AB (ref >90)
Glucose: 106 mg/dl (ref 70–140)
Potassium: 3.9 mEq/L (ref 3.5–5.1)
SODIUM: 141 meq/L (ref 136–145)
Total Protein: 7 g/dL (ref 6.4–8.3)

## 2015-02-06 MED ORDER — PROCHLORPERAZINE MALEATE 10 MG PO TABS: 10.0000 mg | ORAL_TABLET | Freq: Four times a day (QID) | ORAL | Status: DC | PRN

## 2015-02-06 NOTE — Telephone Encounter (Signed)
per pof to sch pt appt-Pt spouse came to sit @ desk and stated he was sick of waiting on her taxol to be sch in June-adv we had to send to Scheduler and get it sch. Spouse statrted yelling-wanrted to know who my supervisor was and stated I was going to do nothing about it and walked off. Pt and spouse walked pass and did not stop to get sch-Kim J stated she would send email to get taxol sch-and would call w/appts-Registration then called and said we needed to enter a lab-adv pt walked pass me but someone else entered lab

## 2015-02-06 NOTE — Progress Notes (Signed)
[place] OFFICE PROGRESS NOTE   Diagnosis:  Breast cancer Oncology History   Breast cancer of upper-inner quadrant of left female breast  Staging form: Breast, AJCC 7th Edition  Clinical stage from 12/25/2014: Stage IIB (T3, N0, M0) - Unsigned  Staging comments: Staged at breast conference on 3.23.16        Breast cancer of upper-inner quadrant of left female breast   12/09/2014 Breast US ultrasound is performed, showing a diffuse, large, irregular, hypoechoic mass with shadowing located within the superior left breast extending from the 9:30 o'clock position to approximately the 2:30 o'clock position and centered at approximately the    12/13/2014 Oncotype testing RS 27, predicted recurrent risk of 18% with tamoxifen alone   12/13/2014 Pathology Results Breast, left, needle core biopsy, 11:30 o'clock - INVASIVE MAMMARY CARCINOMA, SEE COMMENT. - MAMMARY CARCINOMA IN SITU. 2. Breast, left, needle core biopsy, 9:30 o'clock - INVASIVE MAMMARY CARCINOMA, SEE COMMENT   12/13/2014 Receptors her2 Estrogen Receptor: 99%, POSITIVE, STRONG STAINING INTENSITY Progesterone Receptor: 43%, POSITIVE, MODERATE STAINING INTENSITY Proliferation Marker Ki67: 42% HER-2/NEU BY CISH - NEGATIVE   12/20/2014 Initial Diagnosis Breast cancer of upper-inner quadrant of left female breast   12/23/2014 Breast MRI there is extensive abnormal enhancement throughout much of the left breast suspicious for additional areas of malignancy. Overall area of this abnormal enhancement covers 8.5 cm x 7 cm x 7.1 cm. Most discrete area of abnormal enhancement lies in the up   01/24/2015 -  Chemotherapy docetaxel 78m/m2, cyclophosphamide 600 mg/m, on day 1 every 21 days.        INTERVAL HISTORY:   She returns as scheduled. She completed cycle 1 Taxotere/Cytoxan 01/24/2015. She had no significant nausea. No vomiting. She developed 1 ulcer on the left buccal mucosa and 1 ulcer  on the right buccal mucosa. She also noted a sore throat. No diarrhea. She developed constipation which was relieved with Senokot and prune juice. She had a headache intermittently for about 5 days after the treatment. She was taking Zofran fairly consistently during this time to prevent nausea. Around 5 days following the chemotherapy her husband noted a red line on her forehead which has since resolved. She feels that the breast mass is softer. Over the past weekend she became dizzy and lightheaded. She checked her blood pressure and found it to be 78/59. She reports the blood pressure remained low for about a day. She discontinued her blood pressure medication. She reports adequate fluid intake. She had no associated symptoms such as shortness of breath, chest pain or fever. Recent blood pressures have been in normal range. She denies any bone pain following the Neulasta injection.  Objective:  Vital signs in last 24 hours:  Blood pressure 143/92, pulse 72, temperature 97.5 F (36.4 C), temperature source Oral, resp. rate 19, height 5' 5"  (1.651 m), weight 168 lb 6.4 oz (76.386 kg), last menstrual period 02/20/2013, SpO2 100 %.    HEENT: No thrush or ulcers. Resp: Lungs clear bilaterally. Cardio: Regular rate and rhythm. GI: Abdomen soft and nontender. No hepatomegaly. Vascular: No leg edema. Calves soft and nontender. Neuro: Alert and oriented.  Skin: No rash. Breasts: Palpable mass left breast upper outer quadrant.    Lab Results:  Lab Results  Component Value Date   WBC 21.6* 02/06/2015   HGB 13.0 02/06/2015   HCT 38.2 02/06/2015   MCV 90.7 02/06/2015   PLT 180 02/06/2015   NEUTROABS 17.9* 02/06/2015    Imaging:  No results  found.  Medications: I have reviewed the patient's current medications.  Assessment/Plan: 1. Left breast cancer, CT3N0M0,Stage IIB,Invasive lobular carcinoma,strongly ER and PR positive, HER-2 negative, Ki67 33-42%, and lobular carcinoma in situ.  Status post cycle 1 neoadjuvant Taxotere/Cytoxan 01/24/2015. 2. Hypertension. Current blood pressure medication is Zestoretic. She had a hypotensive episode over the past weekend of unclear etiology. She discontinued her blood pressure medication. Blood pressure since has been in normal range. She will continue to monitor her blood pressure at home. With persistent high readings she will contact her PCP. Dr. Burr Medico recommends eliminating the diuretic portion of the blood pressure medication. 3. Question mucositis following cycle 1 Taxotere/Cytoxan. She developed a few mouth sores and a sore throat after cycle 1. None evident on exam today. She will contact the office if she develops mouth sores that impair her ability to eat and drink. 4. Constipation following cycle 1 Taxotere/Cytoxan. She will begin a laxative regimen the day prior to chemotherapy. 5. Headaches intermittently following cycle 1 Taxotere/Cytoxan. Likely related to Zofran. A prescription was sent to her pharmacy for Compazine.   She will return for a follow-up visit prior to cycle 2 on 02/14/2015. She will contact the office in the interim with any problems.  Plan reviewed with Dr. Burr Medico. 25 minutes were spent face-to-face at today's visit with the majority of that time involved in counseling/coordination of care.    Ned Card ANP/GNP-BC   02/06/2015  1:45 PM

## 2015-02-06 NOTE — Telephone Encounter (Signed)
Chemo added per staff message

## 2015-02-06 NOTE — Telephone Encounter (Signed)
Sent msg to add chemo to 06/03 and 06/24 per 04/22 POF.... Cherylann Banas

## 2015-02-07 ENCOUNTER — Encounter: Payer: Self-pay | Admitting: Hematology

## 2015-02-07 ENCOUNTER — Other Ambulatory Visit: Payer: Self-pay | Admitting: General Practice

## 2015-02-07 MED ORDER — LISINOPRIL 20 MG PO TABS: 20.0000 mg | ORAL_TABLET | Freq: Every day | ORAL | Status: DC

## 2015-02-07 NOTE — Progress Notes (Signed)
I placed app for cancer grant on desk of nurse for dr Burr Medico.

## 2015-02-10 ENCOUNTER — Encounter: Payer: Self-pay | Admitting: Hematology

## 2015-02-10 NOTE — Progress Notes (Signed)
I mailed application for cancer grant to [employer] [facility]. I will mail the patient a copy also for her records.

## 2015-02-10 NOTE — Progress Notes (Signed)
I placed copy with ms. Wilma--front desk instead of mailing.

## 2015-02-12 ENCOUNTER — Telehealth: Payer: Self-pay | Admitting: Hematology

## 2015-02-12 NOTE — Telephone Encounter (Signed)
Faxed pt medical records to [facility]

## 2015-02-14 ENCOUNTER — Ambulatory Visit (HOSPITAL_BASED_OUTPATIENT_CLINIC_OR_DEPARTMENT_OTHER): Payer: BLUE CROSS/BLUE SHIELD | Admitting: Nurse Practitioner

## 2015-02-14 ENCOUNTER — Other Ambulatory Visit: Payer: BLUE CROSS/BLUE SHIELD

## 2015-02-14 ENCOUNTER — Other Ambulatory Visit (HOSPITAL_BASED_OUTPATIENT_CLINIC_OR_DEPARTMENT_OTHER): Payer: BLUE CROSS/BLUE SHIELD

## 2015-02-14 ENCOUNTER — Ambulatory Visit (HOSPITAL_BASED_OUTPATIENT_CLINIC_OR_DEPARTMENT_OTHER): Payer: BLUE CROSS/BLUE SHIELD

## 2015-02-14 ENCOUNTER — Telehealth: Payer: Self-pay | Admitting: Nurse Practitioner

## 2015-02-14 VITALS — BP 141/87 | HR 84 | Temp 97.8°F | Resp 18 | Ht 65.0 in | Wt 169.8 lb

## 2015-02-14 DIAGNOSIS — C50812 Malignant neoplasm of overlapping sites of left female breast: Secondary | ICD-10-CM | POA: Diagnosis not present

## 2015-02-14 DIAGNOSIS — I10 Essential (primary) hypertension: Secondary | ICD-10-CM | POA: Diagnosis not present

## 2015-02-14 DIAGNOSIS — C50212 Malignant neoplasm of upper-inner quadrant of left female breast: Secondary | ICD-10-CM

## 2015-02-14 DIAGNOSIS — Z5112 Encounter for antineoplastic immunotherapy: Secondary | ICD-10-CM

## 2015-02-14 DIAGNOSIS — K5900 Constipation, unspecified: Secondary | ICD-10-CM | POA: Diagnosis not present

## 2015-02-14 DIAGNOSIS — Z5111 Encounter for antineoplastic chemotherapy: Secondary | ICD-10-CM | POA: Diagnosis not present

## 2015-02-14 LAB — CBC WITH DIFFERENTIAL/PLATELET
BASO%: 0.2 % (ref 0.0–2.0)
Basophils Absolute: 0 10*3/uL (ref 0.0–0.1)
EOS ABS: 0 10*3/uL (ref 0.0–0.5)
EOS%: 0.4 % (ref 0.0–7.0)
HCT: 35.4 % (ref 34.8–46.6)
HGB: 12.1 g/dL (ref 11.6–15.9)
LYMPH%: 21.6 % (ref 14.0–49.7)
MCH: 30.3 pg (ref 25.1–34.0)
MCHC: 34.1 g/dL (ref 31.5–36.0)
MCV: 89.1 fL (ref 79.5–101.0)
MONO#: 0.5 10*3/uL (ref 0.1–0.9)
MONO%: 8.3 % (ref 0.0–14.0)
NEUT#: 4.3 10*3/uL (ref 1.5–6.5)
NEUT%: 69.5 % (ref 38.4–76.8)
Platelets: 468 10*3/uL — ABNORMAL HIGH (ref 145–400)
RBC: 3.98 10*6/uL (ref 3.70–5.45)
RDW: 13.2 % (ref 11.2–14.5)
WBC: 6.2 10*3/uL (ref 3.9–10.3)
lymph#: 1.3 10*3/uL (ref 0.9–3.3)

## 2015-02-14 LAB — COMPREHENSIVE METABOLIC PANEL (CC13)
ALT: 28 U/L (ref 0–55)
AST: 18 U/L (ref 5–34)
Albumin: 3.6 g/dL (ref 3.5–5.0)
Alkaline Phosphatase: 61 U/L (ref 40–150)
Anion Gap: 9 mEq/L (ref 3–11)
BILIRUBIN TOTAL: 0.54 mg/dL (ref 0.20–1.20)
BUN: 11.6 mg/dL (ref 7.0–26.0)
CO2: 27 mEq/L (ref 22–29)
Calcium: 9.6 mg/dL (ref 8.4–10.4)
Chloride: 110 mEq/L — ABNORMAL HIGH (ref 98–109)
Creatinine: 0.7 mg/dL (ref 0.6–1.1)
EGFR: >90 mL/min/{1.73_m2} (ref >90)
Glucose: 101 mg/dl (ref 70–140)
Potassium: 4.4 mEq/L (ref 3.5–5.1)
SODIUM: 146 meq/L — AB (ref 136–145)
TOTAL PROTEIN: 6.9 g/dL (ref 6.4–8.3)

## 2015-02-14 MED ORDER — HEPARIN SOD (PORK) LOCK FLUSH 100 UNIT/ML IV SOLN
500.0000 [IU] | Freq: Once | INTRAVENOUS | Status: DC | PRN
  Filled 2015-02-14: qty 5

## 2015-02-14 MED ORDER — SODIUM CHLORIDE 0.9 % IJ SOLN
10.0000 mL | INTRAMUSCULAR | Status: DC | PRN
  Filled 2015-02-14: qty 10

## 2015-02-14 MED ORDER — SODIUM CHLORIDE 0.9 % IV SOLN
Freq: Once | INTRAVENOUS | Status: AC
  Administered 2015-02-14: 15:00:00 via INTRAVENOUS

## 2015-02-14 MED ORDER — CYCLOPHOSPHAMIDE CHEMO INJECTION 1 GM
600.0000 mg/m2 | Freq: Once | INTRAMUSCULAR | Status: AC
  Administered 2015-02-14: 1140 mg via INTRAVENOUS
  Filled 2015-02-14: qty 57

## 2015-02-14 MED ORDER — SODIUM CHLORIDE 0.9 % IV SOLN
Freq: Once | INTRAVENOUS | Status: AC
  Administered 2015-02-14: 15:00:00 via INTRAVENOUS
  Filled 2015-02-14: qty 8

## 2015-02-14 MED ORDER — DOCETAXEL CHEMO INJECTION 160 MG/16ML
75.0000 mg/m2 | Freq: Once | INTRAVENOUS | Status: AC
  Administered 2015-02-14: 140 mg via INTRAVENOUS
  Filled 2015-02-14: qty 14

## 2015-02-14 NOTE — Telephone Encounter (Signed)
Pt confirmed labs/ov per 05/13 POF, gave pt AVS and Calendar.... KJ

## 2015-02-14 NOTE — Progress Notes (Signed)
[place] OFFICE PROGRESS NOTE   Diagnosis:  Breast cancer Oncology History   Breast cancer of upper-inner quadrant of left female breast  Staging form: Breast, AJCC 7th Edition  Clinical stage from 12/25/2014: Stage IIB (T3, N0, M0) - Unsigned  Staging comments: Staged at breast conference on 3.23.16        Breast cancer of upper-inner quadrant of left female breast   12/09/2014 Breast US ultrasound is performed, showing a diffuse, large, irregular, hypoechoic mass with shadowing located within the superior left breast extending from the 9:30 o'clock position to approximately the 2:30 o'clock position and centered at approximately the    12/13/2014 Oncotype testing RS 27, predicted recurrent risk of 18% with tamoxifen alone   12/13/2014 Pathology Results Breast, left, needle core biopsy, 11:30 o'clock - INVASIVE MAMMARY CARCINOMA, SEE COMMENT. - MAMMARY CARCINOMA IN SITU. 2. Breast, left, needle core biopsy, 9:30 o'clock - INVASIVE MAMMARY CARCINOMA, SEE COMMENT   12/13/2014 Receptors her2 Estrogen Receptor: 99%, POSITIVE, STRONG STAINING INTENSITY Progesterone Receptor: 43%, POSITIVE, MODERATE STAINING INTENSITY Proliferation Marker Ki67: 42% HER-2/NEU BY CISH - NEGATIVE   12/20/2014 Initial Diagnosis Breast cancer of upper-inner quadrant of left female breast   12/23/2014 Breast MRI there is extensive abnormal enhancement throughout much of the left breast suspicious for additional areas of malignancy. Overall area of this abnormal enhancement covers 8.5 cm x 7 cm x 7.1 cm. Most discrete area of abnormal enhancement lies in the up   01/24/2015 -  Chemotherapy docetaxel 22m/m2, cyclophosphamide 600 mg/m, on day 1 every 21 days.            INTERVAL HISTORY:   She returns as scheduled prior to proceeding with cycle 2 Taxotere/Cytoxan. She feels well. No nausea or vomiting. No mouth sores. Bowels  overall moving regularly. No numbness or tingling in her hands or feet. She has noted significant loss since her last visit 1 week ago.  Objective:  Vital signs in last 24 hours:  Blood pressure 141/87, pulse 84, temperature 97.8 F (36.6 C), temperature source Oral, resp. rate 18, height 5' 5"  (1.651 m), weight 169 lb 12.8 oz (77.021 kg), last menstrual period 02/20/2013, SpO2 99 %.    HEENT: No thrush or ulcers. Resp: Lungs clear bilaterally. Cardio: Regular rate and rhythm. GI: Abdomen soft and nontender. No hepatomegaly. Vascular: No leg edema. Calves soft and nontender. Neuro: Motor strength 5 over 5. Knee DTRs 2+, symmetric.  Skin: No rash.    Lab Results:  Lab Results  Component Value Date   WBC 6.2 02/14/2015   HGB 12.1 02/14/2015   HCT 35.4 02/14/2015   MCV 89.1 02/14/2015   PLT 468* 02/14/2015   NEUTROABS 4.3 02/14/2015    Imaging:  No results found.  Medications: I have reviewed the patient's current medications.  Assessment/Plan: 1. Left breast cancer, CT3N0M0,Stage IIB,Invasive lobular carcinoma,strongly ER and PR positive, HER-2 negative, Ki67 33-42%, and lobular carcinoma in situ. Status post cycle 1 neoadjuvant Taxotere/Cytoxan 01/24/2015. 2. Hypertension. Current blood pressure medication is lisinopril. She is monitoring her blood pressure at home.  3. Question mucositis following cycle 1 Taxotere/Cytoxan. She developed a few mouth sores and a sore throat after cycle 1. She will contact the office if she develops mouth sores that impair her ability to eat and drink. 4. Constipation following cycle 1 Taxotere/Cytoxan. She is on a laxative regimen. 5. Headaches intermittently following cycle 1 Taxotere/Cytoxan. Likely related to Zofran. She has Compazine for as needed use with cycle 2.  Disposition: Ms. Woodmansee appears stable. She has completed 1 cycle of neoadjuvant Taxotere/Cytoxan. Plan to proceed with cycle 2 today as scheduled. She will return  for a follow-up visit on 02/24/2015. She will contact the office in the interim with any problems.  Plan reviewed with Dr. Burr Medico.     Ned Card ANP/GNP-BC   02/14/2015  2:21 PM

## 2015-02-14 NOTE — Patient Instructions (Signed)
[place] [facility] Discharge Instructions for Patients Receiving Chemotherapy  Today you received the following chemotherapy agents Taxotere and Cytoxan.  To help prevent nausea and vomiting after your treatment, we encourage you to take your nausea medication Take 1 tablet (8 mg total) by mouth 2 (two) times daily. Start the day after chemo for 3 days. Then take as needed for nausea or vomiting.   If you develop nausea and vomiting that is not controlled by your nausea medication, call the clinic.   BELOW ARE SYMPTOMS THAT SHOULD BE REPORTED IMMEDIATELY:  *FEVER GREATER THAN 100.5 F  *CHILLS WITH OR WITHOUT FEVER  NAUSEA AND VOMITING THAT IS NOT CONTROLLED WITH YOUR NAUSEA MEDICATION  *UNUSUAL SHORTNESS OF BREATH  *UNUSUAL BRUISING OR BLEEDING  TENDERNESS IN MOUTH AND THROAT WITH OR WITHOUT PRESENCE OF ULCERS  *URINARY PROBLEMS  *BOWEL PROBLEMS  UNUSUAL RASH Items with * indicate a potential emergency and should be followed up as soon as possible.  Feel free to call the clinic you have any questions or concerns. The clinic phone number is [phone].  Please show the [place] at check-in to the Emergency Department and triage nurse.

## 2015-02-17 ENCOUNTER — Ambulatory Visit (HOSPITAL_BASED_OUTPATIENT_CLINIC_OR_DEPARTMENT_OTHER): Payer: BLUE CROSS/BLUE SHIELD

## 2015-02-17 VITALS — BP 141/80 | HR 88 | Temp 98.2°F

## 2015-02-17 DIAGNOSIS — Z5189 Encounter for other specified aftercare: Secondary | ICD-10-CM

## 2015-02-17 DIAGNOSIS — C50212 Malignant neoplasm of upper-inner quadrant of left female breast: Secondary | ICD-10-CM | POA: Diagnosis not present

## 2015-02-17 DIAGNOSIS — C50812 Malignant neoplasm of overlapping sites of left female breast: Secondary | ICD-10-CM

## 2015-02-17 MED ORDER — PEGFILGRASTIM INJECTION 6 MG/0.6ML ~~LOC~~
6.0000 mg | PREFILLED_SYRINGE | Freq: Once | SUBCUTANEOUS | Status: AC
  Administered 2015-02-17: 6 mg via SUBCUTANEOUS
  Filled 2015-02-17: qty 0.6

## 2015-02-18 ENCOUNTER — Ambulatory Visit: Payer: BLUE CROSS/BLUE SHIELD | Admitting: Hematology

## 2015-02-21 ENCOUNTER — Telehealth: Payer: Self-pay | Admitting: Family

## 2015-02-21 NOTE — Telephone Encounter (Addendum)
Pt's husband sent below message via his mychart account.   Please contact patient and let her know that I am covering for Dr. Birdie Riddle and that I think her BP is too low.  Our records show that she is taking lisinopril 73m once daily.  I recommend that she cut lisinopril in half.  Hold lisinopril all together if SBP <110.  Record daily readings and contact Dr. TBirdie Riddlenext week with readings please.        Hi,        Here's update on Emily Griffith's BP since changing to just Losartan on 5/6. [name] 2nd chemo treatment on 5/13.    These are all morning readings (between 7 am and 8 am). Are these readings acceptable or too low?        She seems to have more energy and not experiencing dizziness or light headed spells. We asked [name] Dr. FErnestina PennaNP, who asked Dr. FBurr Medicowhile we met with [name] 5/13. [name] behalf of Dr. FBurr Medicosays we should refer BP questions to Dr. TBirdie Riddle        Thoughts?  Thanks.        5/20 - 110/73    5/19 - 94/72    5/18 - 99/75    5/17 - 99/69    5/16 - 118/88    5/15 - 102/72    5/14 - 102/64 - 10:30 am and 88/66 earlier am    5/12 - 95/70    5/5 - 115/91      From: KPatterson Hammersmith   Sent: 02/06/2015  2:30 PM EDT      To: KAnnye Asa MD Subject: Non-Urgent Medical Question  Hi,  Writing on behalf of my wife [name]..  Two things. 1) She finished the supply of Vitamin D prescription.  She purchased a vitamin D supplement.  How much in msg dose should she take and how often?  2) She had a check-up appt w/ Dr. FErnestina PennaNP, LNed Cardtoday - 2 weeks after her first chemo treatment.  We mentioned how [name]'s BP dropped to 78/77 last Fri/Sat after day 7 (she felt light headed so she started a diary of temp and BP every day). May 1 - skipped BP pill.   BP back up to around 100 (?) May 2 - BP 122/77 - took 1/2 pill May 3 - skipped BP pill May 4 - skipped BP pill May 5 - BP - 115/85 at home before work and appt.  155/91  at appt, then 144/85 after appt.  Dr. FBurr Medicosaid she thinks the hydrochol. part of Lisin combo pill could be causing low BP and should ask you for a prescrip for just Linsinopril.  Thoughts?    Thanks.

## 2015-02-21 NOTE — Telephone Encounter (Signed)
Left message on pt's cell and home #s to return my call.

## 2015-02-21 NOTE — Telephone Encounter (Signed)
Notified pt and she voices understanding. 

## 2015-02-21 NOTE — Telephone Encounter (Signed)
Patient returned phone call. Best # [phone]

## 2015-02-24 ENCOUNTER — Ambulatory Visit (HOSPITAL_BASED_OUTPATIENT_CLINIC_OR_DEPARTMENT_OTHER): Payer: BLUE CROSS/BLUE SHIELD | Admitting: Oncology

## 2015-02-24 ENCOUNTER — Encounter: Payer: Self-pay | Admitting: Oncology

## 2015-02-24 VITALS — BP 148/84 | HR 87 | Temp 98.0°F | Resp 18 | Ht 65.0 in | Wt 168.3 lb

## 2015-02-24 DIAGNOSIS — R51 Headache: Secondary | ICD-10-CM | POA: Diagnosis not present

## 2015-02-24 DIAGNOSIS — C50212 Malignant neoplasm of upper-inner quadrant of left female breast: Secondary | ICD-10-CM | POA: Diagnosis not present

## 2015-02-24 DIAGNOSIS — K5900 Constipation, unspecified: Secondary | ICD-10-CM | POA: Diagnosis not present

## 2015-02-24 DIAGNOSIS — I10 Essential (primary) hypertension: Secondary | ICD-10-CM | POA: Diagnosis not present

## 2015-02-24 NOTE — Progress Notes (Signed)
[place] OFFICE PROGRESS NOTE   Diagnosis:  Breast cancer Oncology History   Breast cancer of upper-inner quadrant of left female breast  Staging form: Breast, AJCC 7th Edition  Clinical stage from 12/25/2014: Stage IIB (T3, N0, M0) - Unsigned  Staging comments: Staged at breast conference on 3.23.16        Breast cancer of upper-inner quadrant of left female breast   12/09/2014 Breast US ultrasound is performed, showing a diffuse, large, irregular, hypoechoic mass with shadowing located within the superior left breast extending from the 9:30 o'clock position to approximately the 2:30 o'clock position and centered at approximately the    12/13/2014 Oncotype testing RS 27, predicted recurrent risk of 18% with tamoxifen alone   12/13/2014 Pathology Results Breast, left, needle core biopsy, 11:30 o'clock - INVASIVE MAMMARY CARCINOMA, SEE COMMENT. - MAMMARY CARCINOMA IN SITU. 2. Breast, left, needle core biopsy, 9:30 o'clock - INVASIVE MAMMARY CARCINOMA, SEE COMMENT   12/13/2014 Receptors her2 Estrogen Receptor: 99%, POSITIVE, STRONG STAINING INTENSITY Progesterone Receptor: 43%, POSITIVE, MODERATE STAINING INTENSITY Proliferation Marker Ki67: 42% HER-2/NEU BY CISH - NEGATIVE   12/20/2014 Initial Diagnosis Breast cancer of upper-inner quadrant of left female breast   12/23/2014 Breast MRI there is extensive abnormal enhancement throughout much of the left breast suspicious for additional areas of malignancy. Overall area of this abnormal enhancement covers 8.5 cm x 7 cm x 7.1 cm. Most discrete area of abnormal enhancement lies in the up   01/24/2015 -  Chemotherapy docetaxel 72m/m2, cyclophosphamide 600 mg/m, on day 1 every 21 days.            INTERVAL HISTORY:   She returns as scheduled for mid-cycle check. She is S/P 2 cycles of Taxotere/Cytoxan. She feels well. No nausea or vomiting. No mouth sores.  Bowels overall moving regularly. No numbness or tingling in her hands or feet. PCP has cut BP medication in half due to hypotension. BP running 100's-110's/60's-70's at home. No longer dizzy.  Objective:  Vital signs in last 24 hours:  Blood pressure 148/84, pulse 87, temperature 98 F (36.7 C), temperature source Oral, resp. rate 18, height _0  (1.651 m), weight 168 lb 4.8 oz (76.34 kg), last menstrual period 02/20/2013, SpO2 100 %.    HEENT: No thrush or ulcers. Resp: Lungs clear bilaterally. Cardio: Regular rate and rhythm. GI: Abdomen soft and nontender. No hepatomegaly. Vascular: No leg edema. Calves soft and nontender. Neuro: Motor strength 5 over 5. Knee DTRs 2+, symmetric.  Skin: No rash.    Lab Results:  Lab Results  Component Value Date   WBC 6.2 02/14/2015   HGB 12.1 02/14/2015   HCT 35.4 02/14/2015   MCV 89.1 02/14/2015   PLT 468* 02/14/2015   NEUTROABS 4.3 02/14/2015    Imaging:  No results found.  Medications: I have reviewed the patient's current medications.  Assessment/Plan: 1. Left breast cancer, CT3N0M0,Stage IIB,Invasive lobular carcinoma,strongly ER and PR positive, HER-2 negative, Ki67 33-42%, and lobular carcinoma in situ. Status post cycle 2 neoadjuvant Taxotere/Cytoxan 02/14/15. 2. Hypertension. Current blood pressure medication is lisinopril. She is monitoring her blood pressure at home.  3. Question mucositis following cycle 1 Taxotere/Cytoxan. She developed a few mouth sores and a sore throat after cycle 1. No mucositis with cycle 2. Resolved. 4. Constipation following cycle 1 Taxotere/Cytoxan. She is on a laxative regimen with improvement. 5. Headaches intermittently following cycle 1 Taxotere/Cytoxan. Likely related to Zofran. She has Compazine for as needed without recurrence of her headaches.  Disposition: Ms. Corriher appears stable. She has completed 2 cycles of neoadjuvant Taxotere/Cytoxan. Plan is for her to return on 6/2 prior  to cycle 3 of chemo. She will contact the office in the interim with any problems.  Mikey Bussing, DNP, AGPCNP-BC, AOCNP   02/24/2015  7:16 PM

## 2015-03-06 ENCOUNTER — Ambulatory Visit (HOSPITAL_BASED_OUTPATIENT_CLINIC_OR_DEPARTMENT_OTHER): Payer: BLUE CROSS/BLUE SHIELD | Admitting: Hematology

## 2015-03-06 ENCOUNTER — Telehealth: Payer: Self-pay | Admitting: Hematology

## 2015-03-06 ENCOUNTER — Encounter: Payer: Self-pay | Admitting: Hematology

## 2015-03-06 ENCOUNTER — Other Ambulatory Visit (HOSPITAL_BASED_OUTPATIENT_CLINIC_OR_DEPARTMENT_OTHER): Payer: BLUE CROSS/BLUE SHIELD

## 2015-03-06 ENCOUNTER — Other Ambulatory Visit: Payer: Self-pay | Admitting: *Deleted

## 2015-03-06 VITALS — BP 131/83 | HR 82 | Temp 98.1°F | Resp 17 | Ht 65.0 in | Wt 169.3 lb

## 2015-03-06 DIAGNOSIS — I10 Essential (primary) hypertension: Secondary | ICD-10-CM

## 2015-03-06 DIAGNOSIS — C50812 Malignant neoplasm of overlapping sites of left female breast: Secondary | ICD-10-CM | POA: Diagnosis not present

## 2015-03-06 DIAGNOSIS — R740 Nonspecific elevation of levels of transaminase and lactic acid dehydrogenase [LDH]: Secondary | ICD-10-CM | POA: Diagnosis not present

## 2015-03-06 DIAGNOSIS — C50212 Malignant neoplasm of upper-inner quadrant of left female breast: Secondary | ICD-10-CM

## 2015-03-06 LAB — COMPREHENSIVE METABOLIC PANEL (CC13)
ALT: 74 U/L — ABNORMAL HIGH (ref 0–55)
AST: 35 U/L — ABNORMAL HIGH (ref 5–34)
Albumin: 3.4 g/dL — ABNORMAL LOW (ref 3.5–5.0)
Alkaline Phosphatase: 61 U/L (ref 40–150)
Anion Gap: 8 mEq/L (ref 3–11)
BUN: 12.9 mg/dL (ref 7.0–26.0)
CALCIUM: 9.1 mg/dL (ref 8.4–10.4)
CHLORIDE: 107 meq/L (ref 98–109)
CO2: 26 mEq/L (ref 22–29)
CREATININE: 0.7 mg/dL (ref 0.6–1.1)
EGFR: >90 mL/min/{1.73_m2} (ref >90)
Glucose: 99 mg/dl (ref 70–140)
Potassium: 3.8 mEq/L (ref 3.5–5.1)
SODIUM: 140 meq/L (ref 136–145)
TOTAL PROTEIN: 6.4 g/dL (ref 6.4–8.3)
Total Bilirubin: 0.73 mg/dL (ref 0.20–1.20)

## 2015-03-06 LAB — CBC WITH DIFFERENTIAL/PLATELET
BASO%: 1.2 % (ref 0.0–2.0)
Basophils Absolute: 0.1 10*3/uL (ref 0.0–0.1)
EOS%: 0.4 % (ref 0.0–7.0)
Eosinophils Absolute: 0 10*3/uL (ref 0.0–0.5)
HCT: 33.6 % — ABNORMAL LOW (ref 34.8–46.6)
HEMOGLOBIN: 11.1 g/dL — AB (ref 11.6–15.9)
LYMPH%: 18.8 % (ref 14.0–49.7)
MCH: 30.5 pg (ref 25.1–34.0)
MCHC: 33 g/dL (ref 31.5–36.0)
MCV: 92.3 fL (ref 79.5–101.0)
MONO#: 0.6 10*3/uL (ref 0.1–0.9)
MONO%: 11.2 % (ref 0.0–14.0)
NEUT#: 3.5 10*3/uL (ref 1.5–6.5)
NEUT%: 68.4 % (ref 38.4–76.8)
Platelets: 311 10*3/uL (ref 145–400)
RBC: 3.64 10*6/uL — ABNORMAL LOW (ref 3.70–5.45)
RDW: 15.5 % — AB (ref 11.2–14.5)
WBC: 5.1 10*3/uL (ref 3.9–10.3)
lymph#: 1 10*3/uL (ref 0.9–3.3)

## 2015-03-06 NOTE — Telephone Encounter (Signed)
Gave and printed appt sched and avs for pt for June July and AUG

## 2015-03-06 NOTE — Progress Notes (Signed)
[place]  Telephone:([phone]) [phone] Fax:([phone]) [place] Note   Patient Care Team: Midge Minium, MD as PCP - General (Family Medicine) Fanny Skates, MD as Consulting Physician (General Surgery) Truitt Merle, MD as Consulting Physician (Hematology) Thea Silversmith, MD as Consulting Physician (Radiation Oncology) Rockwell Germany, RN as Registered Nurse Mauro Kaufmann, RN as Registered Nurse Holley Bouche, NP as Nurse Practitioner (Nurse Practitioner)   CHIEF COMPLAINTS/PURPOSE OF CONSULTATION:  Follow up breast cancer  Oncology History   Breast cancer of upper-inner quadrant of left female breast   Staging form: Breast, AJCC 7th Edition     Clinical stage from 12/25/2014: Stage IIB (T3, N0, M0) - Unsigned       Staging comments: Staged at breast conference on 3.23.16        Breast cancer of upper-inner quadrant of left female breast   12/09/2014 Breast US ultrasound is performed, showing a diffuse, large, irregular, hypoechoic mass with shadowing located within the superior left breast extending from the 9:30 o'clock position to approximately the 2:30 o'clock position and centered at approximately the    12/13/2014 Oncotype testing RS 27, predicted recurrent risk of 18% with tamoxifen alone   12/13/2014 Pathology Results Breast, left, needle core biopsy, 11:30 o'clock - INVASIVE MAMMARY CARCINOMA, SEE COMMENT. - MAMMARY CARCINOMA IN SITU. 2. Breast, left, needle core biopsy, 9:30 o'clock - INVASIVE MAMMARY CARCINOMA, SEE COMMENT   12/13/2014 Receptors her2 Estrogen Receptor: 99%, POSITIVE, STRONG STAINING INTENSITY Progesterone Receptor: 43%, POSITIVE, MODERATE STAINING INTENSITY Proliferation Marker Ki67: 42%  HER-2/NEU BY CISH - NEGATIVE   12/20/2014 Initial Diagnosis Breast cancer of upper-inner quadrant of left female breast   12/23/2014 Breast MRI there is extensive abnormal enhancement throughout much of the left breast suspicious for  additional areas of malignancy. Overall area of this abnormal enhancement covers 8.5 cm x 7 cm x 7.1 cm. Most discrete area of abnormal enhancement lies in the up   01/24/2015 -  Chemotherapy docetaxel 58m/m2, cyclophosphamide 600 mg/m, on day 1 every 21 days.     HISTORY OF PRESENTING ILLNESS:  [name]59y.o. female is here because of newly diagnosed left breast cancer.  This was found by screening mammogram. Her prior screening mammogram was in November 2014 which was negative. Her screening mammogram on 11/25/2014 showed a possible mass and distortion in the left breast. She further underwent diagnostic mammogram and ultrasound on 12/09/2014, which showed a diffuse large irregular hypoechogenic mass extending from 9:30 o'clock position 2-30 o'clock position. Biopsy of this large mass at 2 different positions showed invasive lobular carcinoma.  She denies any symptoms. She feels well overall. She denies any pain, cough, dyspnea, or any GI symptoms. 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 No recent weight loss. No change of her appetite.   She had right breast biopsy was benign. She always has lumpy breast which has not changed per patient. She has IBS, she has dirrhea which is usually triggered by stress, and she takes Imodium as needed.    INTERIM HISTORY: CArbie Cookeyreturns for follow-up and third cycle chemo. She tolerated the first 2 cycle chemotherapy very well, she had mild fatigue for a few days after chemotherapy, but recovered quickly. She has no significant nausea, vomiting, abdominal cramps or diarrhea. No other complaints. She did notice some skin rash on the scalp, for which she has been using Neosporin cream. She lost her hair  2 weeks after her first cycle chemotherapy.  MEDICAL HISTORY:  Past Medical History  Diagnosis Date  . Chicken pox   . Hypertension   . Hyperlipidemia   . IBS (irritable bowel syndrome)   . Breast cancer  of upper-inner quadrant of left female breast 12/20/2014    SURGICAL HISTORY: Past Surgical History  Procedure Laterality Date  . Laser ablation of the cervix  1991    SOCIAL HISTORY: History   Social History  . Marital Status: Married    Spouse Name: N/A  . Number of Children: 0  . Years of Education: N/A   Occupational History  . 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 Smoking status: Never Smoker   . Smokeless tobacco: Not on file  . Alcohol Use: Yes     Comment: social   . Drug Use: No  . Sexual Activity: Not on file   Other Topics Concern  . Not on file   Social History Narrative   GYN HISTORY  Menarchal: 11 LMP: 03/2013  Contraceptive: no HRT: no  G0P0:    FAMILY HISTORY: Family History  Problem Relation Age of Onset  . Hypertension Mother   . Sudden death Father 60    bleeding   . Cancer Maternal Uncle 60    unknown cancer   . Cancer Paternal Uncle 60    unknown cancer     ALLERGIES:  has No Known Allergies.  MEDICATIONS:  Current Outpatient Prescriptions  Medication Sig Dispense Refill  . atorvastatin (LIPITOR) 40 MG tablet TAKE 1 TABLET EVERY NIGHT AT BEDTIME 90 tablet 1  . Cholecalciferol (VITAMIN D) 2000 UNITS CAPS Take 2,000 Units by mouth daily.    Marland Kitchen lisinopril (PRINIVIL,ZESTRIL) 20 MG tablet Take 1 tablet (20 mg total) by mouth daily. (Patient taking differently: Take 10 mg by mouth daily. ) 90 tablet 1  . potassium chloride SA (K-DUR,KLOR-CON) 20 MEQ tablet TAKE 1 TABLET DAILY 90 tablet 1  . dicyclomine (BENTYL) 20 MG tablet TAKE 1 TABLET BY MOUTH EVERY 6 HOURS (Patient not taking: Reported on 03/06/2015) 60 tablet 3  . prochlorperazine (COMPAZINE) 10 MG tablet Take 1 tablet (10 mg total) by mouth every 6 (six) hours as needed for nausea or vomiting. (Patient not taking: Reported on 03/06/2015) 30 tablet 2   No current facility-administered medications for this visit.    REVIEW OF SYSTEMS:   Constitutional:  Denies fevers, chills or abnormal night sweats Eyes: Denies blurriness of vision, double vision or watery eyes Ears, nose, mouth, throat, and face: Denies mucositis or sore throat Respiratory: Denies cough, dyspnea or wheezes Cardiovascular: Denies palpitation, chest discomfort or lower extremity swelling Gastrointestinal:  Denies nausea, heartburn or change in bowel habits Skin: Denies abnormal skin rashes Lymphatics: Denies new lymphadenopathy or easy bruising Neurological:Denies numbness, tingling or new weaknesses Behavioral/Psych: Mood is stable, no new changes  All other systems were reviewed with the patient and are negative.  PHYSICAL EXAMINATION: ECOG PERFORMANCE STATUS: 0 - Asymptomatic  Filed Vitals:   03/06/15 1427  BP: 131/83  Pulse: 82  Temp: 98.1 F (36.7 C)  Resp: 17   Filed Weights   03/06/15 1427  Weight: 169 lb 4.8 oz (76.794 kg)    GENERAL:alert, no distress and comfortable SKIN: skin color, texture, turgor are normal, no rashes or significant lesions EYES: normal, conjunctiva are pink and non-injected, sclera clear OROPHARYNX:no exudate, no erythema and lips, buccal mucosa, and tongue normal  NECK: supple,  thyroid normal size, non-tender, without nodularity LYMPH:  no palpable lymphadenopathy in the cervical, axillary or inguinal LUNGS: clear to auscultation and percussion with normal breathing effort HEART: regular rate & rhythm and no murmurs and no lower extremity edema ABDOMEN:abdomen soft, non-tender and normal bowel sounds Musculoskeletal:no cyanosis of digits and no clubbing  PSYCH: alert & oriented x 3 with fluent speech NEURO: no focal motor/sensory deficits Breasts: Breast inspection showed them to be symmetrical with no nipple discharge. There is a mass like area measuring 6X3cm, which is much smaller (initially 9X6.5cm) and softer in the left upper mid and outer quadrant of left breast, which has not changed per patient, palpitation of the  right breast and axilla revealed no obvious mass that I could appreciate.   LABORATORY DATA:  I have reviewed the data as listed CBC Latest Ref Rng 03/06/2015 02/14/2015 02/06/2015  WBC 3.9 - 10.3 10e3/uL 5.1 6.2 21.6(H)  Hemoglobin 11.6 - 15.9 g/dL 11.1(L) 12.1 13.0  Hematocrit 34.8 - 46.6 % 33.6(L) 35.4 38.2  Platelets 145 - 400 10e3/uL 311 468(H) 180    CMP Latest Ref Rng 03/06/2015 02/14/2015 02/06/2015  Glucose 70 - 140 mg/dl 99 101 106  BUN 7.0 - 26.0 mg/dL 12.9 11.6 8.5  Creatinine 0.6 - 1.1 mg/dL 0.7 0.7 0.8  Sodium 136 - 145 mEq/L 140 146(H) 141  Potassium 3.5 - 5.1 mEq/L 3.8 4.4 3.9  Chloride 96 - 112 mEq/L - - -  CO2 22 - 29 mEq/L _0 Calcium 8.4 - 10.4 mg/dL 9.1 9.6 9.3  Total Protein 6.4 - 8.3 g/dL 6.4 6.9 7.0  Total Bilirubin 0.20 - 1.20 mg/dL 0.73 0.54 0.50  Alkaline Phos 40 - 150 U/L 61 61 92  AST 5 - 34 U/L 35(H) 18 21  ALT 0 - 55 U/L 74(H) 28 25    PATHOLOGY REPORT 12/13/2014 1. Breast, left, needle core biopsy, 11:30 o'clock - INVASIVE MAMMARY CARCINOMA, SEE COMMENT. - MAMMARY CARCINOMA IN SITU. 2. Breast, left, needle core biopsy, 9:30 o'clock - INVASIVE MAMMARY CARCINOMA, SEE COMMENT. Microscopic Comment 1. and 2. There is absence of myoepithelial layer demonstrated (smooth muscle myosin heavy chain, calponin, and p63 immunostains). Both the in situ and invasive carcinoma demonstrate absence of E-cadherin expression; supporting a lobular phenotype. Breast prognostic studies are pending and will be reported in an addendum. The case was reviewed with Dr. Avis Epley who concurs.  2. PROGNOSTIC INDICATORS - ACIS Results: IMMUNOHISTOCHEMICAL AND MORPHOMETRIC ANALYSIS BY THE AUTOMATED CELLULAR IMAGING SYSTEM (ACIS) (BLOCK 2A) Estrogen Receptor: 99%, POSITIVE, STRONG STAINING INTENSITY Progesterone Receptor: 71%, POSITIVE, STRONG STAINING INTENSITY Proliferation Marker Ki67: 33%  CHROMOGENIC IN-SITU HYBRIDIZATION Results: 1A HER-2/NEU BY CISH -  NEGATIVE. RESULT RATIO OF HER2: CEP 17 SIGNALS 1.84 AVERAGE HER2 COPY NUMBER PER CELL 3.50  1. PROGNOSTIC INDICATORS - ACIS Results: IMMUNOHISTOCHEMICAL AND MORPHOMETRIC ANALYSIS BY THE AUTOMATED CELLULAR IMAGING SYSTEM (ACIS) (BLOCK 1A) Estrogen Receptor: 99%, POSITIVE, STRONG STAINING INTENSITY Progesterone Receptor: 43%, POSITIVE, MODERATE STAINING INTENSITY Proliferation Marker Ki67: 42%  RADIOGRAPHIC STUDIES: I have personally reviewed the radiological images as listed and agreed with the findings in the report.  Mr Breast Bilateral W Wo Contrast 12/23/2014   FINDINGS: Breast composition: c. Heterogeneous fibroglandular tissue.  Background parenchymal enhancement: Moderate.  Right breast: No suspicious mass or abnormal enhancement. 12 mm oval cyst in the central upper breast. Artifact from the previous biopsy lies lateral to and inferior to the breast cyst.  Left breast: Extensive, heterogeneous abnormal enhancement is seen throughout much  of the left breast. Susceptibility artifact from the biopsy clip is evident along the anterior central margin of this abnormal enhancement. Susceptibility artifact from another biopsy clip is seen deep to the nipple where there is a focal area of irregular enhancement. Overall extent of this enhancement measures 8.5 cm x 7.1 cm x 7 cm. It extends 3 anterior posterior third of the breast. The largest area of abnormal enhancement is in the middle to posterior upper outer quadrant. This is in the location of the amorphic calcifications and distortion mammographically. Enhancement kinetics from this area of abnormal enhancement areas from plateau to minimal washout.  Lymph nodes: No abnormal appearing lymph nodes.  Ancillary findings:  None.   IMPRESSION: 1. In addition to the biopsy proven area of left breast carcinoma, there is extensive abnormal enhancement throughout much of the left breast suspicious for additional areas of malignancy. Overall area of this  abnormal enhancement covers 8.5 cm x 7 cm x 7.1 cm. Most discrete area of abnormal enhancement lies in the upper outer quadrant where mammographically there is architectural distortion and the amorphic calcifications. 2. No evidence of right breast malignancy. 3. No suspicious or enlarged lymph nodes.  RECOMMENDATION: Unless left mastectomy is planned, biopsy of the upper outer quadrant of the left breast (where there is distortion and pleomorphic calcifications, as well as the dominant area of the abnormal MRI enhancement) under stereotactic guidance would be recommended to confirm extent of disease.  BI-RADS CATEGORY  5: Highly suggestive of malignancy.   Electronically Signed   By: Lajean Manes M.D.   On: 12/23/2014 11:23    Mm Diag Breast Tomo Uni Left 12/09/2014  FINDINGS: There is a large area of distortion located within the superior left breast extending from approximately the 10 o'clock position to the 2 o'clock position. There are numerous variably sized and shaped calcifications seen within the superior left breast which span 8 cm.  Mammographic images were processed with CAD.  On physical exam, there is a large, firm, palpable mass occupying the majority of the superior portion of the left breast extending from approximately the 9:30 o'clock position to the 2:30 o'clock position centered at approximately the 11:30 o'clock position. By physical examination this measures approximately 10 cm in size.  There is no palpable left axillary adenopathy.  Targeted ultrasound is performed, showing a diffuse, large, irregular, hypoechoic mass with shadowing located within the superior left breast extending from the 9:30 o'clock position to approximately the 2:30 o'clock position and centered at approximately the 11:30 o'clock position 4 cm from the nipple. This corresponds to the large palpable mass. This is difficult to size accurately by ultrasound but measures approximately 8 cm in greatest dimension. This is  a suspicious mass. Tissue sampling via ultrasound-guided core biopsy of 2 separate areas is recommended (to determine extent of disease).  Ultrasound of the left axilla demonstrates no adenopathy and normal axillary contents.  IMPRESSION: Large palpable mass associated with distortion, shadowing, and numerous microcalcifications. Ultrasound-guided core biopsy of 2 sites is recommended. This is scheduled for Friday 12/13/2014.  RECOMMENDATION: Left breast ultrasound-guided core biopsies as discussed above.  I have discussed the findings and recommendations with the patient. Results were also provided in writing at the conclusion of the visit. If applicable, a reminder letter will be sent to the patient regarding the next appointment.  BI-RADS CATEGORY  5: Highly suggestive of malignancy.   Electronically Signed   By: Altamese Cabal M.D.   On: 12/09/2014 16:34  [place] Lt Breast Bx W Loc Dev 1st Lesion Img Bx Spec US Guide  12/18/2014   ADDENDUM REPORT: 12/18/2014 13:16  ADDENDUM: The pathology revealed invasive mammary carcinoma at both the 9:30 o'clock and 11:30 o'clock biopsy sites. This is found to be concordant with imaging findings. I discussed the results over the phone with the patient. The patient states she is doing well post biopsy without complications. Recommend MRI of the breast and [place] clinic. The patient was given the appointment dates for both.   Electronically Signed   By: Abelardo Diesel M.D.   On: 12/18/2014 13:16   12/18/2014   CLINICAL DATA:  Hypoechoic lesions left breast for biopsy.  EXAM: ULTRASOUND GUIDED LEFT BREAST CORE NEEDLE BIOPSIES  COMPARISON:  Previous exam(s).  FINDINGS: I met with the patient and we discussed the procedure of ultrasound-guided biopsy, including benefits and alternatives. We discussed the high likelihood of a successful procedure. We discussed the risks of the procedure, including infection, bleeding, tissue injury, clip migration, and inadequate sampling. Informed  written consent was given. The usual time-out protocol was performed immediately prior to the procedure.  Using sterile technique and 2% Lidocaine as local anesthetic, under direct ultrasound visualization, a 14 gauge spring-loaded device was used to perform biopsy of hypoechoic area of left breast 11:30 o'clock using a medial approach. At the conclusion of the procedure a heart shaped tissue marker clip was deployed into the biopsy cavity. Follow up 2 view mammogram was performed and dictated separately.  Using sterile technique and 2% Lidocaine as local anesthetic, under direct ultrasound visualization, a 14 gauge spring-loaded device was used to perform biopsy of hypoechoic area of left breast 9:30 o'clock using a medial approach. At the conclusion of the procedure a ribbon tissue marker clip was deployed into the biopsy cavity. Follow up 2 view mammogram was performed and dictated separately.  IMPRESSION: Ultrasound guided biopsies of left breast. No apparent complications.  Electronically Signed: By: Abelardo Diesel M.D. On: 12/13/2014 16:24    ASSESSMENT & PLAN:  59 yo female, with PMH of  Hypertension and a mild IBS , otherwise very fit and healthy postmenopausal woman , who was found to have left breast cancer by screening mammogram.  1. CT3N0M0,  Stage IIB,  Invasive lobular carcinoma,  Strongly ER and PR positive, HER-2 negative, Ki67 33-42%, and lobular carcinoma in situ - I reviewed her images findings and biopsy results  In great details with patient and her husband. - she is likely need a mastectomy giving the large size of the tumor,  She was seen by Dr. Dalbert Batman. - giving the large size of the tumor, neoadjuvant therapy is  Preferred to achieve complete surgical resection.   -I discussed her Oncotype results. Her recurrence score is 27, which predicts 18% of recurrence with with tamoxifen alone. It is intermediate risk. Although the benefit of chemotherapy in intermediate risk group remains  controversial, given her relatively high score in the intermediate or risk of range, and large size of the tumor, I recommend new adjuvant chemotherapy with 6 cycles of TC (Cytoxan and docetaxel). -I discussed this with her surgeon Dr. Dalbert Batman and also who agrees with neoadjuvant chemotherapy. -The goal of chemotherapy is curative - Giving the strong ER/PR positivity, I would recommend adjuvant endocrine therapy with aromatase inhibitor. - she is likely also need adjuvant radiation, she was seen by Dr. Pablo Ledger.  -Lab reviewed, mild anemia and transaminase elevation, adequate for treatment, we'll proceed cycle 3 tomorrow.  2.  Hypertension - continue medication. Follow-up of his primary care physician  3. Transaminitis -She has slightly elevated liver enzyme ALT and AST, likely related to chemotherapy -We'll repeat her liver enzymes in between her cycle 3 and 4, close monitoring   Plan: -cycle 3 TC tomorrow, plan for a total of 6 cycles   -lab in 10 days and before each treatment   Patient and her husband had multiple is she taking long I answered to their satisfaction. The patient knows to call the clinic with any problems, questions or concerns.  I spent 20 minutes counseling the patient face to face. The total time spent in the appointment was 25 minutes and more than 50% was on counseling.     Truitt Merle, MD 01/24/2015 3:47 PM

## 2015-03-07 ENCOUNTER — Other Ambulatory Visit: Payer: BLUE CROSS/BLUE SHIELD

## 2015-03-07 ENCOUNTER — Ambulatory Visit (HOSPITAL_BASED_OUTPATIENT_CLINIC_OR_DEPARTMENT_OTHER): Payer: BLUE CROSS/BLUE SHIELD

## 2015-03-07 VITALS — BP 135/88 | HR 73 | Temp 98.3°F | Resp 18

## 2015-03-07 DIAGNOSIS — C50212 Malignant neoplasm of upper-inner quadrant of left female breast: Secondary | ICD-10-CM

## 2015-03-07 DIAGNOSIS — Z5111 Encounter for antineoplastic chemotherapy: Secondary | ICD-10-CM | POA: Diagnosis not present

## 2015-03-07 DIAGNOSIS — C50812 Malignant neoplasm of overlapping sites of left female breast: Secondary | ICD-10-CM

## 2015-03-07 MED ORDER — SODIUM CHLORIDE 0.9 % IV SOLN
600.0000 mg/m2 | Freq: Once | INTRAVENOUS | Status: AC
  Administered 2015-03-07: 1140 mg via INTRAVENOUS
  Filled 2015-03-07: qty 57

## 2015-03-07 MED ORDER — DOCETAXEL CHEMO INJECTION 160 MG/16ML
75.0000 mg/m2 | Freq: Once | INTRAVENOUS | Status: AC
  Administered 2015-03-07: 140 mg via INTRAVENOUS
  Filled 2015-03-07: qty 14

## 2015-03-07 MED ORDER — SODIUM CHLORIDE 0.9 % IV SOLN
Freq: Once | INTRAVENOUS | Status: AC
  Administered 2015-03-07: 15:00:00 via INTRAVENOUS
  Filled 2015-03-07: qty 8

## 2015-03-07 MED ORDER — SODIUM CHLORIDE 0.9 % IV SOLN
250.0000 mL | Freq: Once | INTRAVENOUS | Status: AC
  Administered 2015-03-07: 250 mL via INTRAVENOUS

## 2015-03-07 NOTE — Patient Instructions (Signed)
[facility] Discharge Instructions for Patients Receiving Chemotherapy  Today you received the following chemotherapy agents Taxotere and Cytoxan.  To help prevent nausea and vomiting after your treatment, we encourage you to take your nausea medication Take 1 tablet (8 mg total) by mouth 2 (two) times daily. Start the day after chemo for 3 days. Then take as needed for nausea or vomiting.   If you develop nausea and vomiting that is not controlled by your nausea medication, call the clinic.   BELOW ARE SYMPTOMS THAT SHOULD BE REPORTED IMMEDIATELY:  *FEVER GREATER THAN 100.5 F  *CHILLS WITH OR WITHOUT FEVER  NAUSEA AND VOMITING THAT IS NOT CONTROLLED WITH YOUR NAUSEA MEDICATION  *UNUSUAL SHORTNESS OF BREATH  *UNUSUAL BRUISING OR BLEEDING  TENDERNESS IN MOUTH AND THROAT WITH OR WITHOUT PRESENCE OF ULCERS  *URINARY PROBLEMS  *BOWEL PROBLEMS  UNUSUAL RASH Items with * indicate a potential emergency and should be followed up as soon as possible.  Feel free to call the clinic you have any questions or concerns. The clinic phone number is [phone].  Please show the [facility] at check-in to the Emergency Department and triage nurse.

## 2015-03-10 ENCOUNTER — Ambulatory Visit (HOSPITAL_BASED_OUTPATIENT_CLINIC_OR_DEPARTMENT_OTHER): Payer: BLUE CROSS/BLUE SHIELD

## 2015-03-10 VITALS — BP 124/85 | HR 90 | Temp 99.3°F

## 2015-03-10 DIAGNOSIS — C50212 Malignant neoplasm of upper-inner quadrant of left female breast: Secondary | ICD-10-CM

## 2015-03-10 DIAGNOSIS — Z5189 Encounter for other specified aftercare: Secondary | ICD-10-CM

## 2015-03-10 DIAGNOSIS — C50812 Malignant neoplasm of overlapping sites of left female breast: Secondary | ICD-10-CM | POA: Diagnosis not present

## 2015-03-10 MED ORDER — PEGFILGRASTIM INJECTION 6 MG/0.6ML ~~LOC~~
6.0000 mg | PREFILLED_SYRINGE | Freq: Once | SUBCUTANEOUS | Status: AC
  Administered 2015-03-10: 6 mg via SUBCUTANEOUS
  Filled 2015-03-10: qty 0.6

## 2015-03-18 ENCOUNTER — Telehealth: Payer: Self-pay | Admitting: *Deleted

## 2015-03-18 NOTE — Telephone Encounter (Signed)
Call received reporting rash to IV site on right arm.  "03-07-2015 I recieved taxotere/cytoxan to right forearm.  It was a new nurse for me and the site turned black and blue and now it's red.  It measure 1.5" x 1" and is not bumpy but the area inside the vein of the arm is red.  I've been putting hydrocortisone cream on it."  Admits to taking Aleve "but I haven't taken any in a few days".  Denies swelling, warmth, itching, pain or vein streak only redness around the perimeter of where IV was.  Advised this is a bruise that is resolving and will take more time to completed be re-absorbed and clear completely.  Instructed not to put hydrocortisone cream on the site but to apply cool compresses and to call if site changes or worsens.  No further symptoms or complaints reported at this time.

## 2015-03-28 ENCOUNTER — Ambulatory Visit (HOSPITAL_BASED_OUTPATIENT_CLINIC_OR_DEPARTMENT_OTHER): Payer: BLUE CROSS/BLUE SHIELD | Admitting: Hematology

## 2015-03-28 ENCOUNTER — Encounter: Payer: Self-pay | Admitting: Hematology

## 2015-03-28 ENCOUNTER — Other Ambulatory Visit (HOSPITAL_BASED_OUTPATIENT_CLINIC_OR_DEPARTMENT_OTHER): Payer: BLUE CROSS/BLUE SHIELD

## 2015-03-28 ENCOUNTER — Other Ambulatory Visit: Payer: BLUE CROSS/BLUE SHIELD

## 2015-03-28 ENCOUNTER — Ambulatory Visit (HOSPITAL_BASED_OUTPATIENT_CLINIC_OR_DEPARTMENT_OTHER): Payer: BLUE CROSS/BLUE SHIELD

## 2015-03-28 VITALS — BP 125/65 | HR 71 | Temp 98.2°F | Resp 18 | Ht 65.0 in | Wt 168.5 lb

## 2015-03-28 DIAGNOSIS — C50812 Malignant neoplasm of overlapping sites of left female breast: Secondary | ICD-10-CM | POA: Diagnosis not present

## 2015-03-28 DIAGNOSIS — C50212 Malignant neoplasm of upper-inner quadrant of left female breast: Secondary | ICD-10-CM

## 2015-03-28 DIAGNOSIS — Z5111 Encounter for antineoplastic chemotherapy: Secondary | ICD-10-CM | POA: Diagnosis not present

## 2015-03-28 DIAGNOSIS — R740 Nonspecific elevation of levels of transaminase and lactic acid dehydrogenase [LDH]: Secondary | ICD-10-CM

## 2015-03-28 DIAGNOSIS — I10 Essential (primary) hypertension: Secondary | ICD-10-CM

## 2015-03-28 LAB — CBC WITH DIFFERENTIAL/PLATELET
BASO%: 1.4 % (ref 0.0–2.0)
BASOS ABS: 0.1 10*3/uL (ref 0.0–0.1)
EOS%: 0.3 % (ref 0.0–7.0)
Eosinophils Absolute: 0 10*3/uL (ref 0.0–0.5)
HEMATOCRIT: 31.5 % — AB (ref 34.8–46.6)
HEMOGLOBIN: 10.3 g/dL — AB (ref 11.6–15.9)
LYMPH#: 0.7 10*3/uL — AB (ref 0.9–3.3)
LYMPH%: 14.5 % (ref 14.0–49.7)
MCH: 30.7 pg (ref 25.1–34.0)
MCHC: 32.7 g/dL (ref 31.5–36.0)
MCV: 93.7 fL (ref 79.5–101.0)
MONO#: 0.5 10*3/uL (ref 0.1–0.9)
MONO%: 9.6 % (ref 0.0–14.0)
NEUT#: 3.7 10*3/uL (ref 1.5–6.5)
NEUT%: 74.2 % (ref 38.4–76.8)
PLATELETS: 292 10*3/uL (ref 145–400)
RBC: 3.36 10*6/uL — ABNORMAL LOW (ref 3.70–5.45)
RDW: 18.2 % — ABNORMAL HIGH (ref 11.2–14.5)
WBC: 5 10*3/uL (ref 3.9–10.3)

## 2015-03-28 LAB — COMPREHENSIVE METABOLIC PANEL (CC13)
ALT: 71 U/L — ABNORMAL HIGH (ref 0–55)
ANION GAP: 7 meq/L (ref 3–11)
AST: 35 U/L — ABNORMAL HIGH (ref 5–34)
Albumin: 3.4 g/dL — ABNORMAL LOW (ref 3.5–5.0)
Alkaline Phosphatase: 52 U/L (ref 40–150)
BUN: 9.7 mg/dL (ref 7.0–26.0)
CO2: 26 mEq/L (ref 22–29)
CREATININE: 0.6 mg/dL (ref 0.6–1.1)
Calcium: 9.1 mg/dL (ref 8.4–10.4)
Chloride: 108 mEq/L (ref 98–109)
Glucose: 93 mg/dl (ref 70–140)
Potassium: 3.4 mEq/L — ABNORMAL LOW (ref 3.5–5.1)
Sodium: 141 mEq/L (ref 136–145)
Total Bilirubin: 0.54 mg/dL (ref 0.20–1.20)
Total Protein: 6.2 g/dL — ABNORMAL LOW (ref 6.4–8.3)

## 2015-03-28 MED ORDER — SODIUM CHLORIDE 0.9 % IV SOLN
Freq: Once | INTRAVENOUS | Status: AC
  Administered 2015-03-28: 15:00:00 via INTRAVENOUS
  Filled 2015-03-28: qty 8

## 2015-03-28 MED ORDER — DOCETAXEL CHEMO INJECTION 160 MG/16ML
75.0000 mg/m2 | Freq: Once | INTRAVENOUS | Status: AC
  Administered 2015-03-28: 140 mg via INTRAVENOUS
  Filled 2015-03-28: qty 14

## 2015-03-28 MED ORDER — CYCLOPHOSPHAMIDE CHEMO INJECTION 1 GM
600.0000 mg/m2 | Freq: Once | INTRAMUSCULAR | Status: AC
  Administered 2015-03-28: 1140 mg via INTRAVENOUS
  Filled 2015-03-28: qty 57

## 2015-03-28 MED ORDER — SODIUM CHLORIDE 0.9 % IV SOLN
Freq: Once | INTRAVENOUS | Status: AC
  Administered 2015-03-28: 15:00:00 via INTRAVENOUS

## 2015-03-28 NOTE — Progress Notes (Signed)
[place]  [phone] Fax:([phone] [facility] Note   Patient Care Team: Midge Minium, MD as PCP - General (Family Medicine) Emily Skates, MD as Consulting Physician (General Surgery) Truitt Merle, MD as Consulting Physician (Hematology) Thea Silversmith, MD as Consulting Physician (Radiation Oncology) Rockwell [place], RN as Registered Nurse Mauro Kaufmann, RN as Registered Nurse Holley Bouche, NP as Nurse Practitioner (Nurse Practitioner)   CHIEF COMPLAINTS/PURPOSE OF CONSULTATION:  Follow up breast cancer  Oncology History   Breast cancer of upper-inner quadrant of left female breast   Staging form: Breast, AJCC 7th Edition     Clinical stage from 12/25/2014: Stage IIB (T3, N0, M0) - Unsigned       Staging comments: Staged at breast conference on 3.23.16        Breast cancer of upper-inner quadrant of left female breast   12/09/2014 Breast US ultrasound is performed, showing a diffuse, large, irregular, hypoechoic mass with shadowing located within the superior left breast extending from the 9:30 o'clock position to approximately the 2:30 o'clock position and centered at approximately the    12/13/2014 Oncotype testing RS 27, predicted recurrent risk of 18% with tamoxifen alone   12/13/2014 Pathology Results Breast, left, needle core biopsy, 11:30 o'clock - INVASIVE MAMMARY CARCINOMA, SEE COMMENT. - MAMMARY CARCINOMA IN SITU. 2. Breast, left, needle core biopsy, 9:30 o'clock - INVASIVE MAMMARY CARCINOMA, SEE COMMENT   12/13/2014 Receptors her2 Estrogen Receptor: 99%, POSITIVE, STRONG STAINING INTENSITY Progesterone Receptor: 43%, POSITIVE, MODERATE STAINING INTENSITY Proliferation Marker Ki67: 42%  HER-2/NEU BY CISH - NEGATIVE   12/20/2014 Initial Diagnosis Breast cancer of upper-inner quadrant of left female breast   12/23/2014 Breast MRI there is extensive abnormal enhancement throughout much of the left breast suspicious for  additional areas of malignancy. Overall area of this abnormal enhancement covers 8.5 cm x 7 cm x 7.1 cm. Most discrete area of abnormal enhancement lies in the up   01/24/2015 -  Chemotherapy docetaxel 17m/m2, cyclophosphamide 600 mg/m, on day 1 every 21 days.     HISTORY OF PRESENTING ILLNESS:  [name]59y.o. female is here because of newly diagnosed left breast cancer.  This was found by screening mammogram. Her prior screening mammogram was in November 2014 which was negative. Her screening mammogram on 11/25/2014 showed a possible mass and distortion in the left breast. She further underwent diagnostic mammogram and ultrasound on 12/09/2014, which showed a diffuse large irregular hypoechogenic mass extending from 9:30 o'clock position 2-30 o'clock position. Biopsy of this large mass at 2 different positions showed invasive lobular carcinoma.  She denies any symptoms. She feels well overall. She denies any pain, cough, dyspnea, or any GI symptoms. 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 No recent weight loss. No change of her appetite.   She had right breast biopsy was benign. She always has lumpy breast which has not changed per patient. She has IBS, she has dirrhea which is usually triggered by stress, and she takes Imodium as needed.    INTERIM HISTORY: CArbie Cookeyreturns for follow-up and forth cycle chemo. She has been to you very well overall. She continues working full-time, and pMarketing executiveon the weekend. No significant pain, nausea, or other symptoms. Mild fatigue after chemotherapy, recovers well. She had some skin erythema and pigmentation at the IV insertion site on the right arm after last cycle chemotherapy. No pain, itchiness or swelling.  MEDICAL HISTORY:  Past Medical History  Diagnosis Date  . Chicken pox   . Hypertension   . Hyperlipidemia   . IBS (irritable bowel syndrome)   . Breast cancer of upper-inner quadrant of  left female breast 12/20/2014    SURGICAL HISTORY: Past Surgical History  Procedure Laterality Date  . Laser ablation of the cervix  1991    SOCIAL HISTORY: History   Social History  . Marital Status: Married    Spouse Name: N/A  . Number of Children: 0  . Years of Education: N/A   Occupational History  . 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 Smoking status: Never Smoker   . Smokeless tobacco: Not on file  . Alcohol Use: Yes     Comment: social   . Drug Use: No  . Sexual Activity: Not on file   Other Topics Concern  . Not on file   Social History Narrative   GYN HISTORY  Menarchal: 11 LMP: 03/2013  Contraceptive: no HRT: no  G0P0:    FAMILY HISTORY: Family History  Problem Relation Age of Onset  . Hypertension Mother   . Sudden death Father 30    bleeding   . Cancer Maternal Uncle 60    unknown cancer   . Cancer Paternal Uncle 26    unknown cancer     ALLERGIES:  has No Known Allergies.  MEDICATIONS:  Current Outpatient Prescriptions  Medication Sig Dispense Refill  . atorvastatin (LIPITOR) 40 MG tablet TAKE 1 TABLET EVERY NIGHT AT BEDTIME 90 tablet 1  . Cholecalciferol (VITAMIN D) 2000 UNITS CAPS Take 2,000 Units by mouth daily.    Marland Kitchen lisinopril (PRINIVIL,ZESTRIL) 20 MG tablet Take 1 tablet (20 mg total) by mouth daily. (Patient taking differently: Take 10 mg by mouth daily. ) 90 tablet 1  . potassium chloride SA (K-DUR,KLOR-CON) 20 MEQ tablet TAKE 1 TABLET DAILY 90 tablet 1  . dicyclomine (BENTYL) 20 MG tablet TAKE 1 TABLET BY MOUTH EVERY 6 HOURS (Patient not taking: Reported on 03/06/2015) 60 tablet 3  . ondansetron (ZOFRAN) 8 MG tablet Take 8 mg by mouth every 8 (eight) hours as needed.    . prochlorperazine (COMPAZINE) 10 MG tablet Take 1 tablet (10 mg total) by mouth every 6 (six) hours as needed for nausea or vomiting. (Patient not taking: Reported on 03/06/2015) 30 tablet 2   No current facility-administered  medications for this visit.    REVIEW OF SYSTEMS:   Constitutional: Denies fevers, chills or abnormal night sweats Eyes: Denies blurriness of vision, double vision or watery eyes Ears, nose, mouth, throat, and face: Denies mucositis or sore throat Respiratory: Denies cough, dyspnea or wheezes Cardiovascular: Denies palpitation, chest discomfort or lower extremity swelling Gastrointestinal:  Denies nausea, heartburn or change in bowel habits Skin: Denies abnormal skin rashes Lymphatics: Denies new lymphadenopathy or easy bruising Neurological:Denies numbness, tingling or new weaknesses Behavioral/Psych: Mood is stable, no new changes  All other systems were reviewed with the patient and are negative.  PHYSICAL EXAMINATION: ECOG PERFORMANCE STATUS: 0 - Asymptomatic  Filed Vitals:   03/28/15 1359  BP: 125/65  Pulse: 71  Temp: 98.2 F (36.8 C)  Resp: 18   Filed Weights   03/28/15 1359  Weight: 168 lb 8 oz (76.431 kg)    GENERAL:alert, no distress and comfortable SKIN: skin color, texture, turgor are normal, no rashes or significant lesions EYES: normal, conjunctiva are pink and non-injected, sclera clear OROPHARYNX:no exudate,  no erythema and lips, buccal mucosa, and tongue normal  NECK: supple, thyroid normal size, non-tender, without nodularity LYMPH:  no palpable lymphadenopathy in the cervical, axillary or inguinal LUNGS: clear to auscultation and percussion with normal breathing effort HEART: regular rate & rhythm and no murmurs and no lower extremity edema ABDOMEN:abdomen soft, non-tender and normal bowel sounds Musculoskeletal:no cyanosis of digits and no clubbing  PSYCH: alert & oriented x 3 with fluent speech NEURO: no focal motor/sensory deficits Breasts: Breast inspection showed them to be symmetrical with no nipple discharge. There is some fullness in the left upper mid and outer quadrant of left breast, but no discrete mass (initial mass measuring 9X6.5cm),   palpitation of the right breast and axilla revealed no obvious mass that I could appreciate.   LABORATORY DATA:  I have reviewed the data as listed CBC Latest Ref Rng 03/28/2015 03/06/2015 02/14/2015  WBC 3.9 - 10.3 10e3/uL 5.0 5.1 6.2  Hemoglobin 11.6 - 15.9 g/dL 10.3(L) 11.1(L) 12.1  Hematocrit 34.8 - 46.6 % 31.5(L) 33.6(L) 35.4  Platelets 145 - 400 10e3/uL 292 311 468(H)    CMP Latest Ref Rng 03/28/2015 03/06/2015 02/14/2015  Glucose 70 - 140 mg/dl 93 99 101  BUN 7.0 - 26.0 mg/dL 9.7 12.9 11.6  Creatinine 0.6 - 1.1 mg/dL 0.6 0.7 0.7  Sodium 136 - 145 mEq/L 141 140 146(H)  Potassium 3.5 - 5.1 mEq/L 3.4(L) 3.8 4.4  Chloride 96 - 112 mEq/L - - -  CO2 22 - 29 mEq/L 26 26 27   Calcium 8.4 - 10.4 mg/dL 9.1 9.1 9.6  Total Protein 6.4 - 8.3 g/dL 6.2(L) 6.4 6.9  Total Bilirubin 0.20 - 1.20 mg/dL 0.54 0.73 0.54  Alkaline Phos 40 - 150 U/L 52 61 61  AST 5 - 34 U/L 35(H) 35(H) 18  ALT 0 - 55 U/L 71(H) 74(H) 28    PATHOLOGY REPORT 12/13/2014 1. Breast, left, needle core biopsy, 11:30 o'clock - INVASIVE MAMMARY CARCINOMA, SEE COMMENT. - MAMMARY CARCINOMA IN SITU. 2. Breast, left, needle core biopsy, 9:30 o'clock - INVASIVE MAMMARY CARCINOMA, SEE COMMENT. Microscopic Comment 1. and 2. There is absence of myoepithelial layer demonstrated (smooth muscle myosin heavy chain, calponin, and p63 immunostains). Both the in situ and invasive carcinoma demonstrate absence of E-cadherin expression; supporting a lobular phenotype. Breast prognostic studies are pending and will be reported in an addendum. The case was reviewed with Dr. Avis Epley who concurs.  2. PROGNOSTIC INDICATORS - ACIS Results: IMMUNOHISTOCHEMICAL AND MORPHOMETRIC ANALYSIS BY THE AUTOMATED CELLULAR IMAGING SYSTEM (ACIS) (BLOCK 2A) Estrogen Receptor: 99%, POSITIVE, STRONG STAINING INTENSITY Progesterone Receptor: 71%, POSITIVE, STRONG STAINING INTENSITY Proliferation Marker Ki67: 33%  CHROMOGENIC IN-SITU HYBRIDIZATION Results:  1A HER-2/NEU BY CISH - NEGATIVE. RESULT RATIO OF HER2: CEP 17 SIGNALS 1.84 AVERAGE HER2 COPY NUMBER PER CELL 3.50  1. PROGNOSTIC INDICATORS - ACIS Results: IMMUNOHISTOCHEMICAL AND MORPHOMETRIC ANALYSIS BY THE AUTOMATED CELLULAR IMAGING SYSTEM (ACIS) (BLOCK 1A) Estrogen Receptor: 99%, POSITIVE, STRONG STAINING INTENSITY Progesterone Receptor: 43%, POSITIVE, MODERATE STAINING INTENSITY Proliferation Marker Ki67: 42%  RADIOGRAPHIC STUDIES: I have personally reviewed the radiological images as listed and agreed with the findings in the report.  Mr Breast Bilateral W Wo Contrast 12/23/2014   FINDINGS: Breast composition: c. Heterogeneous fibroglandular tissue.  Background parenchymal enhancement: Moderate.  Right breast: No suspicious mass or abnormal enhancement. 12 mm oval cyst in the central upper breast. Artifact from the previous biopsy lies lateral to and inferior to the breast cyst.  Left breast: Extensive, heterogeneous abnormal enhancement is  seen throughout much of the left breast. Susceptibility artifact from the biopsy clip is evident along the anterior central margin of this abnormal enhancement. Susceptibility artifact from another biopsy clip is seen deep to the nipple where there is a focal area of irregular enhancement. Overall extent of this enhancement measures 8.5 cm x 7.1 cm x 7 cm. It extends 3 anterior posterior third of the breast. The largest area of abnormal enhancement is in the middle to posterior upper outer quadrant. This is in the location of the amorphic calcifications and distortion mammographically. Enhancement kinetics from this area of abnormal enhancement areas from plateau to minimal washout.  Lymph nodes: No abnormal appearing lymph nodes.  Ancillary findings:  None.   IMPRESSION: 1. In addition to the biopsy proven area of left breast carcinoma, there is extensive abnormal enhancement throughout much of the left breast suspicious for additional areas of  malignancy. Overall area of this abnormal enhancement covers 8.5 cm x 7 cm x 7.1 cm. Most discrete area of abnormal enhancement lies in the upper outer quadrant where mammographically there is architectural distortion and the amorphic calcifications. 2. No evidence of right breast malignancy. 3. No suspicious or enlarged lymph nodes.  RECOMMENDATION: Unless left mastectomy is planned, biopsy of the upper outer quadrant of the left breast (where there is distortion and pleomorphic calcifications, as well as the dominant area of the abnormal MRI enhancement) under stereotactic guidance would be recommended to confirm extent of disease.  BI-RADS CATEGORY  5: Highly suggestive of malignancy.   Electronically Signed   By: Lajean Manes M.D.   On: 12/23/2014 11:23    Mm Diag Breast Tomo Uni Left 12/09/2014  FINDINGS: There is a large area of distortion located within the superior left breast extending from approximately the 10 o'clock position to the 2 o'clock position. There are numerous variably sized and shaped calcifications seen within the superior left breast which span 8 cm.  Mammographic images were processed with CAD.  On physical exam, there is a large, firm, palpable mass occupying the majority of the superior portion of the left breast extending from approximately the 9:30 o'clock position to the 2:30 o'clock position centered at approximately the 11:30 o'clock position. By physical examination this measures approximately 10 cm in size.  There is no palpable left axillary adenopathy.  Targeted ultrasound is performed, showing a diffuse, large, irregular, hypoechoic mass with shadowing located within the superior left breast extending from the 9:30 o'clock position to approximately the 2:30 o'clock position and centered at approximately the 11:30 o'clock position 4 cm from the nipple. This corresponds to the large palpable mass. This is difficult to size accurately by ultrasound but measures approximately 8  cm in greatest dimension. This is a suspicious mass. Tissue sampling via ultrasound-guided core biopsy of 2 separate areas is recommended (to determine extent of disease).  Ultrasound of the left axilla demonstrates no adenopathy and normal axillary contents.  IMPRESSION: Large palpable mass associated with distortion, shadowing, and numerous microcalcifications. Ultrasound-guided core biopsy of 2 sites is recommended. This is scheduled for Friday 12/13/2014.  RECOMMENDATION: Left breast ultrasound-guided core biopsies as discussed above.  I have discussed the findings and recommendations with the patient. Results were also provided in writing at the conclusion of the visit. If applicable, a reminder letter will be sent to the patient regarding the next appointment.  BI-RADS CATEGORY  5: Highly suggestive of malignancy.   Electronically Signed   By: Altamese Cabal M.D.   On: 12/09/2014  16:34   [place] Lt Breast Bx W Loc Dev 1st Lesion Img Bx Spec US Guide  12/18/2014   ADDENDUM REPORT: 12/18/2014 13:16  ADDENDUM: The pathology revealed invasive mammary carcinoma at both the 9:30 o'clock and 11:30 o'clock biopsy sites. This is found to be concordant with imaging findings. I discussed the results over the phone with the patient. The patient states she is doing well post biopsy without complications. Recommend MRI of the breast and [facility]. The patient was given the appointment dates for both.   Electronically Signed   By: Abelardo Diesel M.D.   On: 12/18/2014 13:16   12/18/2014   CLINICAL DATA:  Hypoechoic lesions left breast for biopsy.  EXAM: ULTRASOUND GUIDED LEFT BREAST CORE NEEDLE BIOPSIES  COMPARISON:  Previous exam(s).  FINDINGS: I met with the patient and we discussed the procedure of ultrasound-guided biopsy, including benefits and alternatives. We discussed the high likelihood of a successful procedure. We discussed the risks of the procedure, including infection, bleeding, tissue injury, clip migration,  and inadequate sampling. Informed written consent was given. The usual time-out protocol was performed immediately prior to the procedure.  Using sterile technique and 2% Lidocaine as local anesthetic, under direct ultrasound visualization, a 14 gauge spring-loaded device was used to perform biopsy of hypoechoic area of left breast 11:30 o'clock using a medial approach. At the conclusion of the procedure a heart shaped tissue marker clip was deployed into the biopsy cavity. Follow up 2 view mammogram was performed and dictated separately.  Using sterile technique and 2% Lidocaine as local anesthetic, under direct ultrasound visualization, a 14 gauge spring-loaded device was used to perform biopsy of hypoechoic area of left breast 9:30 o'clock using a medial approach. At the conclusion of the procedure a ribbon tissue marker clip was deployed into the biopsy cavity. Follow up 2 view mammogram was performed and dictated separately.  IMPRESSION: Ultrasound guided biopsies of left breast. No apparent complications.  Electronically Signed: By: Abelardo Diesel M.D. On: 12/13/2014 16:24    ASSESSMENT & PLAN:  59 yo female, with PMH of  Hypertension and a mild IBS , otherwise very fit and healthy postmenopausal woman , who was found to have left breast cancer by screening mammogram.  1. CT3N0M0,  Stage IIB,  Invasive lobular carcinoma,  Strongly ER and PR positive, HER-2 negative, Ki67 33-42%, and lobular carcinoma in situ - I reviewed her images findings and biopsy results  In great details with patient and her husband. - she is likely need a mastectomy giving the large size of the tumor,  She was seen by Dr. Dalbert Batman. - giving the large size of the tumor, neoadjuvant therapy is  Preferred to achieve complete surgical resection.   -I discussed her Oncotype results. Her recurrence score is 27, which predicts 18% of recurrence with with tamoxifen alone. It is intermediate risk. Although the benefit of chemotherapy in  intermediate risk group remains controversial, given her relatively high score in the intermediate or risk of range, and large size of the tumor, I recommend new adjuvant chemotherapy with 6 cycles of TC (Cytoxan and docetaxel). -I discussed this with her surgeon Dr. Dalbert Batman and also who agrees with neoadjuvant chemotherapy. -The goal of chemotherapy is curative - Giving the strong ER/PR positivity, I would recommend adjuvant endocrine therapy with aromatase inhibitor. - she is likely also need adjuvant radiation, she was seen by Dr. Pablo Ledger.  -Lab reviewed, mild anemia and transaminase elevation, stable,  adequate for treatment, we'll proceed cycle  4 today.  2.  Hypertension - continue medication. Follow-up of his primary care physician  3. Transaminitis -She has slightly elevated liver enzyme ALT and AST, likely related to chemotherapy -stable, continue close monitoring   4. Right skin pigmentation -secondary to docetaxel infiltration -I recommend saline flush after she completes docetaxel infusion, and apply ice pack to the infusion site  Plan: -cycle 4 TC today, plan for a total of 6 cycles     Patient and her husband had multiple is she taking long I answered to their satisfaction. The patient knows to call the clinic with any problems, questions or concerns.  I spent 20 minutes counseling the patient face to face. The total time spent in the appointment was 25 minutes and more than 50% was on counseling.     Truitt Merle, MD 03/28/2015   2:45 PM

## 2015-03-28 NOTE — Patient Instructions (Signed)
[facility] Discharge Instructions for Patients Receiving Chemotherapy  Today you received the following chemotherapy agents: Taxotere, Cytoxan  To help prevent nausea and vomiting after your treatment, we encourage you to take your nausea medication as prescribed by your physician.   If you develop nausea and vomiting that is not controlled by your nausea medication, call the clinic.   BELOW ARE SYMPTOMS THAT SHOULD BE REPORTED IMMEDIATELY:  *FEVER GREATER THAN 100.5 F  *CHILLS WITH OR WITHOUT FEVER  NAUSEA AND VOMITING THAT IS NOT CONTROLLED WITH YOUR NAUSEA MEDICATION  *UNUSUAL SHORTNESS OF BREATH  *UNUSUAL BRUISING OR BLEEDING  TENDERNESS IN MOUTH AND THROAT WITH OR WITHOUT PRESENCE OF ULCERS  *URINARY PROBLEMS  *BOWEL PROBLEMS  UNUSUAL RASH Items with * indicate a potential emergency and should be followed up as soon as possible.  Feel free to call the clinic you have any questions or concerns. The clinic phone number is [phone].  Please show the [facility] at check-in to the Emergency Department and triage nurse.

## 2015-03-29 ENCOUNTER — Ambulatory Visit: Payer: BLUE CROSS/BLUE SHIELD

## 2015-03-31 ENCOUNTER — Ambulatory Visit (HOSPITAL_BASED_OUTPATIENT_CLINIC_OR_DEPARTMENT_OTHER): Payer: BLUE CROSS/BLUE SHIELD

## 2015-03-31 ENCOUNTER — Ambulatory Visit: Payer: BLUE CROSS/BLUE SHIELD | Admitting: Hematology

## 2015-03-31 VITALS — BP 118/72 | HR 79 | Temp 99.0°F | Resp 18

## 2015-03-31 DIAGNOSIS — C50812 Malignant neoplasm of overlapping sites of left female breast: Secondary | ICD-10-CM | POA: Diagnosis not present

## 2015-03-31 DIAGNOSIS — Z5189 Encounter for other specified aftercare: Secondary | ICD-10-CM

## 2015-03-31 DIAGNOSIS — C50212 Malignant neoplasm of upper-inner quadrant of left female breast: Secondary | ICD-10-CM

## 2015-03-31 MED ORDER — PEGFILGRASTIM INJECTION 6 MG/0.6ML ~~LOC~~
6.0000 mg | PREFILLED_SYRINGE | Freq: Once | SUBCUTANEOUS | Status: AC
  Administered 2015-03-31: 6 mg via SUBCUTANEOUS
  Filled 2015-03-31: qty 0.6

## 2015-04-18 ENCOUNTER — Ambulatory Visit (HOSPITAL_BASED_OUTPATIENT_CLINIC_OR_DEPARTMENT_OTHER): Payer: BLUE CROSS/BLUE SHIELD | Admitting: Physician Assistant

## 2015-04-18 ENCOUNTER — Encounter: Payer: Self-pay | Admitting: Physician Assistant

## 2015-04-18 ENCOUNTER — Ambulatory Visit (HOSPITAL_BASED_OUTPATIENT_CLINIC_OR_DEPARTMENT_OTHER): Payer: BLUE CROSS/BLUE SHIELD

## 2015-04-18 ENCOUNTER — Other Ambulatory Visit (HOSPITAL_BASED_OUTPATIENT_CLINIC_OR_DEPARTMENT_OTHER): Payer: BLUE CROSS/BLUE SHIELD

## 2015-04-18 ENCOUNTER — Encounter: Payer: Self-pay | Admitting: *Deleted

## 2015-04-18 VITALS — BP 108/65 | HR 99 | Temp 98.6°F | Resp 18 | Ht 65.0 in | Wt 166.9 lb

## 2015-04-18 DIAGNOSIS — C50212 Malignant neoplasm of upper-inner quadrant of left female breast: Secondary | ICD-10-CM

## 2015-04-18 DIAGNOSIS — Z5111 Encounter for antineoplastic chemotherapy: Secondary | ICD-10-CM | POA: Diagnosis not present

## 2015-04-18 DIAGNOSIS — C50812 Malignant neoplasm of overlapping sites of left female breast: Secondary | ICD-10-CM | POA: Diagnosis not present

## 2015-04-18 LAB — CBC WITH DIFFERENTIAL/PLATELET
BASO%: 1.4 % (ref 0.0–2.0)
Basophils Absolute: 0.1 10*3/uL (ref 0.0–0.1)
EOS%: 0.4 % (ref 0.0–7.0)
Eosinophils Absolute: 0 10*3/uL (ref 0.0–0.5)
HCT: 31.1 % — ABNORMAL LOW (ref 34.8–46.6)
HGB: 10.3 g/dL — ABNORMAL LOW (ref 11.6–15.9)
LYMPH#: 0.5 10*3/uL — AB (ref 0.9–3.3)
LYMPH%: 10.9 % — ABNORMAL LOW (ref 14.0–49.7)
MCH: 31.2 pg (ref 25.1–34.0)
MCHC: 33 g/dL (ref 31.5–36.0)
MCV: 94.3 fL (ref 79.5–101.0)
MONO#: 0.5 10*3/uL (ref 0.1–0.9)
MONO%: 12.1 % (ref 0.0–14.0)
NEUT#: 3.3 10*3/uL (ref 1.5–6.5)
NEUT%: 75.2 % (ref 38.4–76.8)
PLATELETS: 317 10*3/uL (ref 145–400)
RBC: 3.29 10*6/uL — ABNORMAL LOW (ref 3.70–5.45)
RDW: 19.6 % — ABNORMAL HIGH (ref 11.2–14.5)
WBC: 4.4 10*3/uL (ref 3.9–10.3)

## 2015-04-18 LAB — COMPREHENSIVE METABOLIC PANEL (CC13)
ALT: 25 U/L (ref 0–55)
AST: 19 U/L (ref 5–34)
Albumin: 3.3 g/dL — ABNORMAL LOW (ref 3.5–5.0)
Alkaline Phosphatase: 57 U/L (ref 40–150)
Anion Gap: 8 mEq/L (ref 3–11)
BILIRUBIN TOTAL: 0.55 mg/dL (ref 0.20–1.20)
BUN: 12.7 mg/dL (ref 7.0–26.0)
CO2: 26 meq/L (ref 22–29)
Calcium: 9.2 mg/dL (ref 8.4–10.4)
Chloride: 105 mEq/L (ref 98–109)
Creatinine: 0.6 mg/dL (ref 0.6–1.1)
Glucose: 98 mg/dl (ref 70–140)
POTASSIUM: 3.4 meq/L — AB (ref 3.5–5.1)
Sodium: 139 mEq/L (ref 136–145)
TOTAL PROTEIN: 6.2 g/dL — AB (ref 6.4–8.3)

## 2015-04-18 MED ORDER — DOCETAXEL CHEMO INJECTION 160 MG/16ML
75.0000 mg/m2 | Freq: Once | INTRAVENOUS | Status: AC
  Administered 2015-04-18: 140 mg via INTRAVENOUS
  Filled 2015-04-18: qty 14

## 2015-04-18 MED ORDER — SODIUM CHLORIDE 0.9 % IV SOLN
600.0000 mg/m2 | Freq: Once | INTRAVENOUS | Status: AC
  Administered 2015-04-18: 1140 mg via INTRAVENOUS
  Filled 2015-04-18: qty 57

## 2015-04-18 MED ORDER — SODIUM CHLORIDE 0.9 % IV SOLN
Freq: Once | INTRAVENOUS | Status: AC
  Administered 2015-04-18: 15:00:00 via INTRAVENOUS

## 2015-04-18 MED ORDER — DEXAMETHASONE SODIUM PHOSPHATE 100 MG/10ML IJ SOLN
Freq: Once | INTRAMUSCULAR | Status: AC
  Administered 2015-04-18: 15:00:00 via INTRAVENOUS
  Filled 2015-04-18: qty 8

## 2015-04-18 NOTE — Progress Notes (Signed)
[facility] OFFICE PROGRESS NOTE   Diagnosis:  Breast cancer Oncology History   Breast cancer of upper-inner quadrant of left female breast  Staging form: Breast, AJCC 7th Edition  Clinical stage from 12/25/2014: Stage IIB (T3, N0, M0) - Unsigned  Staging comments: Staged at breast conference on 3.23.16        Breast cancer of upper-inner quadrant of left female breast   12/09/2014 Breast US ultrasound is performed, showing a diffuse, large, irregular, hypoechoic mass with shadowing located within the superior left breast extending from the 9:30 o'clock position to approximately the 2:30 o'clock position and centered at approximately the    12/13/2014 Oncotype testing RS 27, predicted recurrent risk of 18% with tamoxifen alone   12/13/2014 Pathology Results Breast, left, needle core biopsy, 11:30 o'clock - INVASIVE MAMMARY CARCINOMA, SEE COMMENT. - MAMMARY CARCINOMA IN SITU. 2. Breast, left, needle core biopsy, 9:30 o'clock - INVASIVE MAMMARY CARCINOMA, SEE COMMENT   12/13/2014 Receptors her2 Estrogen Receptor: 99%, POSITIVE, STRONG STAINING INTENSITY Progesterone Receptor: 43%, POSITIVE, MODERATE STAINING INTENSITY Proliferation Marker Ki67: 42% HER-2/NEU BY CISH - NEGATIVE   12/20/2014 Initial Diagnosis Breast cancer of upper-inner quadrant of left female breast   12/23/2014 Breast MRI there is extensive abnormal enhancement throughout much of the left breast suspicious for additional areas of malignancy. Overall area of this abnormal enhancement covers 8.5 cm x 7 cm x 7.1 cm. Most discrete area of abnormal enhancement lies in the up   01/24/2015 -  Chemotherapy docetaxel 56m/m2, cyclophosphamide 600 mg/m, on day 1 every 21 days.            INTERVAL HISTORY:   She returns for a follow up visit. She is S/P 4 cycles of Taxotere/Cytoxan. She feels well. No nausea or vomiting. No mouth sores. Bowels  overall moving regularly, although she does have some occasional constipation. No numbness or tingling in her hands or feet. PCP has cut BP medication in half due to hypotension. BP running 100's-110's/60's-70's at home. No longer dizzy.  Objective:  Vital signs in last 24 hours:  Blood pressure 108/65, pulse 99, temperature 98.6 F (37 C), temperature source Oral, resp. rate 18, height 5' 5"  (1.651 m), weight 166 lb 14.4 oz (75.705 kg), last menstrual period 02/20/2013, SpO2 99 %.    HEENT: No thrush or ulcers. Resp: Lungs clear bilaterally. Cardio: Regular rate and rhythm. GI: Abdomen soft and nontender. No hepatomegaly. Vascular: No leg edema. Calves soft and nontender. Neuro: Motor strength 5 over 5. Knee DTRs 2+, symmetric.  Skin: No rash.    Lab Results:  Lab Results  Component Value Date   WBC 4.4 04/18/2015   HGB 10.3* 04/18/2015   HCT 31.1* 04/18/2015   MCV 94.3 04/18/2015   PLT 317 04/18/2015   NEUTROABS 3.3 04/18/2015    Imaging:  No results found.  Medications: I have reviewed the patient's current medications.  Assessment/Plan: 1. Left breast cancer, CT3N0M0,Stage IIB,Invasive lobular carcinoma,strongly ER and PR positive, HER-2 negative, Ki67 33-42%, and lobular carcinoma in situ. Status post cycle 4 neoadjuvant Taxotere/Cytoxan 03/28/15. 2. Hypertension. Current blood pressure medication is lisinopril. She is monitoring her blood pressure at home.  3. Question mucositis following cycle 1 Taxotere/Cytoxan. She developed a few mouth sores and a sore throat after cycle 1. No subsequent episodes of mucositis going forward. . 4. Constipation following cycle 1 Taxotere/Cytoxan. She is on a laxative regimen with improvement. 5. Headaches intermittently following cycle 1 Taxotere/Cytoxan. Likely related to Zofran. She has Compazine  for as needed without recurrence of her headaches.   Disposition: Ms. Basden appears stable. She has completed 4 cycles of  neoadjuvant Taxotere/Cytoxan. Plan is for her to proceed with cycle 5 today as scheduled. She will follow-up as previously scheduled for midcycle checks and will follow-up prior to the start of cycle 6 on 05/09/2015.She will contact the office in the interim with any problems.  Carlton Adam, PA-C   04/18/2015  2:48 PM

## 2015-04-18 NOTE — Patient Instructions (Signed)
[facility] Discharge Instructions for Patients Receiving Chemotherapy  Today you received the following chemotherapy agents Taxotere Cytoxan  To help prevent nausea and vomiting after your treatment, we encourage you to take your nausea medication as needed   If you develop nausea and vomiting that is not controlled by your nausea medication, call the clinic.   BELOW ARE SYMPTOMS THAT SHOULD BE REPORTED IMMEDIATELY:  *FEVER GREATER THAN 100.5 F  *CHILLS WITH OR WITHOUT FEVER  NAUSEA AND VOMITING THAT IS NOT CONTROLLED WITH YOUR NAUSEA MEDICATION  *UNUSUAL SHORTNESS OF BREATH  *UNUSUAL BRUISING OR BLEEDING  TENDERNESS IN MOUTH AND THROAT WITH OR WITHOUT PRESENCE OF ULCERS  *URINARY PROBLEMS  *BOWEL PROBLEMS  UNUSUAL RASH Items with * indicate a potential emergency and should be followed up as soon as possible.  Feel free to call the clinic you have any questions or concerns. The clinic phone number is [phone].  Please show the [facility] at check-in to the Emergency Department and triage nurse.

## 2015-04-19 ENCOUNTER — Ambulatory Visit: Payer: BLUE CROSS/BLUE SHIELD

## 2015-04-21 ENCOUNTER — Ambulatory Visit (HOSPITAL_BASED_OUTPATIENT_CLINIC_OR_DEPARTMENT_OTHER): Payer: BLUE CROSS/BLUE SHIELD

## 2015-04-21 VITALS — BP 126/72 | HR 102 | Temp 98.9°F

## 2015-04-21 DIAGNOSIS — Z5189 Encounter for other specified aftercare: Secondary | ICD-10-CM | POA: Diagnosis not present

## 2015-04-21 DIAGNOSIS — C50212 Malignant neoplasm of upper-inner quadrant of left female breast: Secondary | ICD-10-CM | POA: Diagnosis not present

## 2015-04-21 MED ORDER — PEGFILGRASTIM INJECTION 6 MG/0.6ML ~~LOC~~
6.0000 mg | PREFILLED_SYRINGE | Freq: Once | SUBCUTANEOUS | Status: AC
  Administered 2015-04-21: 6 mg via SUBCUTANEOUS
  Filled 2015-04-21: qty 0.6

## 2015-04-24 NOTE — Patient Instructions (Signed)
Continue labs and chemotherapy as scheduled Follow-up as previously scheduled on 05/09/2015 prior to next scheduled cycle of chemotherapy

## 2015-05-09 ENCOUNTER — Ambulatory Visit (HOSPITAL_BASED_OUTPATIENT_CLINIC_OR_DEPARTMENT_OTHER): Payer: BLUE CROSS/BLUE SHIELD | Admitting: Hematology

## 2015-05-09 ENCOUNTER — Ambulatory Visit (HOSPITAL_BASED_OUTPATIENT_CLINIC_OR_DEPARTMENT_OTHER): Payer: BLUE CROSS/BLUE SHIELD

## 2015-05-09 ENCOUNTER — Telehealth: Payer: Self-pay | Admitting: Hematology

## 2015-05-09 ENCOUNTER — Encounter: Payer: Self-pay | Admitting: *Deleted

## 2015-05-09 ENCOUNTER — Encounter: Payer: Self-pay | Admitting: Hematology

## 2015-05-09 ENCOUNTER — Other Ambulatory Visit (HOSPITAL_BASED_OUTPATIENT_CLINIC_OR_DEPARTMENT_OTHER): Payer: BLUE CROSS/BLUE SHIELD

## 2015-05-09 VITALS — BP 119/77 | HR 90 | Temp 98.8°F | Resp 18 | Ht 65.0 in | Wt 162.9 lb

## 2015-05-09 DIAGNOSIS — R740 Nonspecific elevation of levels of transaminase and lactic acid dehydrogenase [LDH]: Secondary | ICD-10-CM | POA: Diagnosis not present

## 2015-05-09 DIAGNOSIS — C50812 Malignant neoplasm of overlapping sites of left female breast: Secondary | ICD-10-CM

## 2015-05-09 DIAGNOSIS — I10 Essential (primary) hypertension: Secondary | ICD-10-CM

## 2015-05-09 DIAGNOSIS — C50212 Malignant neoplasm of upper-inner quadrant of left female breast: Secondary | ICD-10-CM

## 2015-05-09 DIAGNOSIS — L818 Other specified disorders of pigmentation: Secondary | ICD-10-CM | POA: Diagnosis not present

## 2015-05-09 DIAGNOSIS — Z5111 Encounter for antineoplastic chemotherapy: Secondary | ICD-10-CM

## 2015-05-09 LAB — COMPREHENSIVE METABOLIC PANEL (CC13)
ALT: 31 U/L (ref 0–55)
ANION GAP: 10 meq/L (ref 3–11)
AST: 22 U/L (ref 5–34)
Albumin: 3.5 g/dL (ref 3.5–5.0)
Alkaline Phosphatase: 67 U/L (ref 40–150)
BUN: 10.2 mg/dL (ref 7.0–26.0)
CHLORIDE: 106 meq/L (ref 98–109)
CO2: 26 mEq/L (ref 22–29)
Calcium: 9.2 mg/dL (ref 8.4–10.4)
Creatinine: 0.6 mg/dL (ref 0.6–1.1)
EGFR: >90 mL/min/{1.73_m2} (ref >90)
Glucose: 104 mg/dl (ref 70–140)
Potassium: 3.4 mEq/L — ABNORMAL LOW (ref 3.5–5.1)
Sodium: 141 mEq/L (ref 136–145)
Total Bilirubin: 0.6 mg/dL (ref 0.20–1.20)
Total Protein: 6.6 g/dL (ref 6.4–8.3)

## 2015-05-09 LAB — CBC WITH DIFFERENTIAL/PLATELET
BASO%: 0.5 % (ref 0.0–2.0)
Basophils Absolute: 0 10*3/uL (ref 0.0–0.1)
EOS ABS: 0 10*3/uL (ref 0.0–0.5)
EOS%: 0.4 % (ref 0.0–7.0)
HEMATOCRIT: 32.5 % — AB (ref 34.8–46.6)
HGB: 10.7 g/dL — ABNORMAL LOW (ref 11.6–15.9)
LYMPH%: 8.3 % — AB (ref 14.0–49.7)
MCH: 30.9 pg (ref 25.1–34.0)
MCHC: 32.8 g/dL (ref 31.5–36.0)
MCV: 94.1 fL (ref 79.5–101.0)
MONO#: 0.6 10*3/uL (ref 0.1–0.9)
MONO%: 9.9 % (ref 0.0–14.0)
NEUT%: 80.9 % — ABNORMAL HIGH (ref 38.4–76.8)
NEUTROS ABS: 4.5 10*3/uL (ref 1.5–6.5)
PLATELETS: 324 10*3/uL (ref 145–400)
RBC: 3.46 10*6/uL — ABNORMAL LOW (ref 3.70–5.45)
RDW: 19.8 % — AB (ref 11.2–14.5)
WBC: 5.6 10*3/uL (ref 3.9–10.3)
lymph#: 0.5 10*3/uL — ABNORMAL LOW (ref 0.9–3.3)

## 2015-05-09 MED ORDER — SODIUM CHLORIDE 0.9 % IV SOLN
Freq: Once | INTRAVENOUS | Status: AC
  Administered 2015-05-09: 15:00:00 via INTRAVENOUS
  Filled 2015-05-09: qty 8

## 2015-05-09 MED ORDER — SODIUM CHLORIDE 0.9 % IV SOLN
Freq: Once | INTRAVENOUS | Status: AC
  Administered 2015-05-09: 15:00:00 via INTRAVENOUS

## 2015-05-09 MED ORDER — SODIUM CHLORIDE 0.9 % IV SOLN
600.0000 mg/m2 | Freq: Once | INTRAVENOUS | Status: AC
  Administered 2015-05-09: 1140 mg via INTRAVENOUS
  Filled 2015-05-09: qty 57

## 2015-05-09 MED ORDER — DOCETAXEL CHEMO INJECTION 160 MG/16ML
75.0000 mg/m2 | Freq: Once | INTRAVENOUS | Status: AC
  Administered 2015-05-09: 140 mg via INTRAVENOUS
  Filled 2015-05-09: qty 14

## 2015-05-09 NOTE — Progress Notes (Signed)
[place]  [phone] Fax:([phone]) [place] Note   Patient Care Team: Midge Minium, MD as PCP - General (Family Medicine) Fanny Skates, MD as Consulting Physician (General Surgery) Truitt Merle, MD as Consulting Physician (Hematology) Thea Silversmith, MD as Consulting Physician (Radiation Oncology) Rockwell Germany, RN as Registered Nurse Mauro Kaufmann, RN as Registered Nurse Holley Bouche, NP as Nurse Practitioner (Nurse Practitioner)   CHIEF COMPLAINTS/PURPOSE OF CONSULTATION:  Follow up breast cancer  Oncology History   Breast cancer of upper-inner quadrant of left female breast   Staging form: Breast, AJCC 7th Edition     Clinical stage from 12/25/2014: Stage IIB (T3, N0, M0) - Unsigned       Staging comments: Staged at breast conference on 3.23.16        Breast cancer of upper-inner quadrant of left female breast   12/09/2014 Breast US ultrasound is performed, showing a diffuse, large, irregular, hypoechoic mass with shadowing located within the superior left breast extending from the 9:30 o'clock position to approximately the 2:30 o'clock position and centered at approximately the    12/13/2014 Oncotype testing RS 27, predicted recurrent risk of 18% with tamoxifen alone   12/13/2014 Pathology Results Breast, left, needle core biopsy, 11:30 o'clock - INVASIVE MAMMARY CARCINOMA, SEE COMMENT. - MAMMARY CARCINOMA IN SITU. 2. Breast, left, needle core biopsy, 9:30 o'clock - INVASIVE MAMMARY CARCINOMA, SEE COMMENT   12/13/2014 Receptors her2 Estrogen Receptor: 99%, POSITIVE, STRONG STAINING INTENSITY Progesterone Receptor: 43%, POSITIVE, MODERATE STAINING INTENSITY Proliferation Marker Ki67: 42%  HER-2/NEU BY CISH - NEGATIVE   12/20/2014 Initial Diagnosis Breast cancer of upper-inner quadrant of left female breast   12/23/2014 Breast MRI there is extensive abnormal enhancement throughout much of the left breast suspicious for  additional areas of malignancy. Overall area of this abnormal enhancement covers 8.5 cm x 7 cm x 7.1 cm. Most discrete area of abnormal enhancement lies in the up   01/24/2015 -  Chemotherapy docetaxel 40m/m2, cyclophosphamide 600 mg/m, on day 1 every 21 days.     HISTORY OF PRESENTING ILLNESS:  [name]59y.o. female is here because of newly diagnosed left breast cancer.  This was found by screening mammogram. Her prior screening mammogram was in November 2014 which was negative. Her screening mammogram on 11/25/2014 showed a possible mass and distortion in the left breast. She further underwent diagnostic mammogram and ultrasound on 12/09/2014, which showed a diffuse large irregular hypoechogenic mass extending from 9:30 o'clock position 2-30 o'clock position. Biopsy of this large mass at 2 different positions showed invasive lobular carcinoma.  She denies any symptoms. She feels well overall. She denies any pain, cough, dyspnea, or any GI symptoms. 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 No recent weight loss. No change of her appetite.   She had right breast biopsy was benign. She always has lumpy breast which has not changed per patient. She has IBS, she has dirrhea which is usually triggered by stress, and she takes Imodium as needed.    INTERIM HISTORY: Emily Cookeyreturns for follow-up and last cycle chemo. She is tolerating chemotherapy very well. She had moderate fatigue after the fourth cycle chemotherapy, but did a better with the 5th cycle. She denies any pain, nausea, constipation is well controlled with senna S, no diarrhea, no fever or chills. No neuropathy. She has good appetite and eating well, continue working full time.  MEDICAL HISTORY:  Past Medical History  Diagnosis Date  . Chicken pox   . Hypertension   . Hyperlipidemia   . IBS (irritable bowel syndrome)   . Breast cancer of upper-inner quadrant of left female breast  12/20/2014    SURGICAL HISTORY: Past Surgical History  Procedure Laterality Date  . Laser ablation of the cervix  1991    SOCIAL HISTORY: History   Social History  . Marital Status: Married    Spouse Name: N/A  . Number of Children: 0  . Years of Education: N/A   Occupational History  . 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 Smoking status: Never Smoker   . Smokeless tobacco: Not on file  . Alcohol Use: Yes     Comment: social   . Drug Use: No  . Sexual Activity: Not on file   Other Topics Concern  . Not on file   Social History Narrative   GYN HISTORY  Menarchal: 11 LMP: 03/2013  Contraceptive: no HRT: no  G0P0:    FAMILY HISTORY: Family History  Problem Relation Age of Onset  . Hypertension Mother   . Sudden death Father 30    bleeding   . Cancer Maternal Uncle 60    unknown cancer   . Cancer Paternal Uncle 64    unknown cancer     ALLERGIES:  has No Known Allergies.  MEDICATIONS:  Current Outpatient Prescriptions  Medication Sig Dispense Refill  . atorvastatin (LIPITOR) 40 MG tablet TAKE 1 TABLET EVERY NIGHT AT BEDTIME 90 tablet 1  . Cholecalciferol (VITAMIN D) 2000 UNITS CAPS Take 2,000 Units by mouth daily.    Marland Kitchen dicyclomine (BENTYL) 20 MG tablet TAKE 1 TABLET BY MOUTH EVERY 6 HOURS 60 tablet 3  . potassium chloride SA (K-DUR,KLOR-CON) 20 MEQ tablet TAKE 1 TABLET DAILY 90 tablet 1  . prochlorperazine (COMPAZINE) 10 MG tablet Take 1 tablet (10 mg total) by mouth every 6 (six) hours as needed for nausea or vomiting. 30 tablet 2  . lisinopril (PRINIVIL,ZESTRIL) 20 MG tablet Take 1 tablet (20 mg total) by mouth daily. (Patient not taking: Reported on 05/09/2015) 90 tablet 1   No current facility-administered medications for this visit.    REVIEW OF SYSTEMS:   Constitutional: Denies fevers, chills or abnormal night sweats Eyes: Denies blurriness of vision, double vision or watery eyes Ears, nose, mouth, throat, and  face: Denies mucositis or sore throat Respiratory: Denies cough, dyspnea or wheezes Cardiovascular: Denies palpitation, chest discomfort or lower extremity swelling Gastrointestinal:  Denies nausea, heartburn or change in bowel habits Skin: Denies abnormal skin rashes Lymphatics: Denies new lymphadenopathy or easy bruising Neurological:Denies numbness, tingling or new weaknesses Behavioral/Psych: Mood is stable, no new changes  All other systems were reviewed with the patient and are negative.  PHYSICAL EXAMINATION: ECOG PERFORMANCE STATUS: 0 - Asymptomatic  Filed Vitals:   05/09/15 1401  BP: 119/77  Pulse: 90  Temp: 98.8 F (37.1 C)  Resp: 18   Filed Weights   05/09/15 1401  Weight: 162 lb 14.4 oz (73.891 kg)    GENERAL:alert, no distress and comfortable SKIN: skin color, texture, turgor are normal, no rashes or significant lesions EYES: normal, conjunctiva are pink and non-injected, sclera clear OROPHARYNX:no exudate, no erythema and lips, buccal mucosa, and tongue normal  NECK: supple, thyroid normal size, non-tender, without nodularity LYMPH:  no palpable lymphadenopathy in the cervical, axillary or inguinal LUNGS: clear to auscultation and percussion with  normal breathing effort HEART: regular rate & rhythm and no murmurs and no lower extremity edema ABDOMEN:abdomen soft, non-tender and normal bowel sounds Musculoskeletal:no cyanosis of digits and no clubbing  PSYCH: alert & oriented x 3 with fluent speech NEURO: no focal motor/sensory deficits Breasts: Breast inspection showed them to be symmetrical with no nipple discharge. There is some fullness in the left upper mid and outer quadrant of left breast, but no discrete mass (initial mass measuring 9X6.5cm),  palpitation of the right breast and axilla revealed no obvious mass that I could appreciate.   LABORATORY DATA:  I have reviewed the data as listed CBC Latest Ref Rng 05/09/2015 04/18/2015 03/28/2015  WBC 3.9 - 10.3  10e3/uL 5.6 4.4 5.0  Hemoglobin 11.6 - 15.9 g/dL 10.7(L) 10.3(L) 10.3(L)  Hematocrit 34.8 - 46.6 % 32.5(L) 31.1(L) 31.5(L)  Platelets 145 - 400 10e3/uL 324 317 292    CMP Latest Ref Rng 05/09/2015 04/18/2015 03/28/2015  Glucose 70 - 140 mg/dl 104 98 93  BUN 7.0 - 26.0 mg/dL 10.2 12.7 9.7  Creatinine 0.6 - 1.1 mg/dL 0.6 0.6 0.6  Sodium 136 - 145 mEq/L 141 139 141  Potassium 3.5 - 5.1 mEq/L 3.4(L) 3.4(L) 3.4(L)  Chloride 96 - 112 mEq/L - - -  CO2 22 - 29 mEq/L 26 26 26   Calcium 8.4 - 10.4 mg/dL 9.2 9.2 9.1  Total Protein 6.4 - 8.3 g/dL 6.6 6.2(L) 6.2(L)  Total Bilirubin 0.20 - 1.20 mg/dL 0.60 0.55 0.54  Alkaline Phos 40 - 150 U/L 67 57 52  AST 5 - 34 U/L 22 19 35(H)  ALT 0 - 55 U/L 31 25 71(H)    PATHOLOGY REPORT 12/13/2014 1. Breast, left, needle core biopsy, 11:30 o'clock - INVASIVE MAMMARY CARCINOMA, SEE COMMENT. - MAMMARY CARCINOMA IN SITU. 2. Breast, left, needle core biopsy, 9:30 o'clock - INVASIVE MAMMARY CARCINOMA, SEE COMMENT. Microscopic Comment 1. and 2. There is absence of myoepithelial layer demonstrated (smooth muscle myosin heavy chain, calponin, and p63 immunostains). Both the in situ and invasive carcinoma demonstrate absence of E-cadherin expression; supporting a lobular phenotype. Breast prognostic studies are pending and will be reported in an addendum. The case was reviewed with Dr. Avis Epley who concurs.  2. PROGNOSTIC INDICATORS - ACIS Results: IMMUNOHISTOCHEMICAL AND MORPHOMETRIC ANALYSIS BY THE AUTOMATED CELLULAR IMAGING SYSTEM (ACIS) (BLOCK 2A) Estrogen Receptor: 99%, POSITIVE, STRONG STAINING INTENSITY Progesterone Receptor: 71%, POSITIVE, STRONG STAINING INTENSITY Proliferation Marker Ki67: 33%  CHROMOGENIC IN-SITU HYBRIDIZATION Results: 1A HER-2/NEU BY CISH - NEGATIVE. RESULT RATIO OF HER2: CEP 17 SIGNALS 1.84 AVERAGE HER2 COPY NUMBER PER CELL 3.50  1. PROGNOSTIC INDICATORS - ACIS Results: IMMUNOHISTOCHEMICAL AND MORPHOMETRIC ANALYSIS BY THE  AUTOMATED CELLULAR IMAGING SYSTEM (ACIS) (BLOCK 1A) Estrogen Receptor: 99%, POSITIVE, STRONG STAINING INTENSITY Progesterone Receptor: 43%, POSITIVE, MODERATE STAINING INTENSITY Proliferation Marker Ki67: 42%  RADIOGRAPHIC STUDIES: I have personally reviewed the radiological images as listed and agreed with the findings in the report.  Mr Breast Bilateral W Wo Contrast 12/23/2014   FINDINGS: Breast composition: c. Heterogeneous fibroglandular tissue.  Background parenchymal enhancement: Moderate.  Right breast: No suspicious mass or abnormal enhancement. 12 mm oval cyst in the central upper breast. Artifact from the previous biopsy lies lateral to and inferior to the breast cyst.  Left breast: Extensive, heterogeneous abnormal enhancement is seen throughout much of the left breast. Susceptibility artifact from the biopsy clip is evident along the anterior central margin of this abnormal enhancement. Susceptibility artifact from another biopsy clip is seen deep to the nipple  where there is a focal area of irregular enhancement. Overall extent of this enhancement measures 8.5 cm x 7.1 cm x 7 cm. It extends 3 anterior posterior third of the breast. The largest area of abnormal enhancement is in the middle to posterior upper outer quadrant. This is in the location of the amorphic calcifications and distortion mammographically. Enhancement kinetics from this area of abnormal enhancement areas from plateau to minimal washout.  Lymph nodes: No abnormal appearing lymph nodes.  Ancillary findings:  None.   IMPRESSION: 1. In addition to the biopsy proven area of left breast carcinoma, there is extensive abnormal enhancement throughout much of the left breast suspicious for additional areas of malignancy. Overall area of this abnormal enhancement covers 8.5 cm x 7 cm x 7.1 cm. Most discrete area of abnormal enhancement lies in the upper outer quadrant where mammographically there is architectural distortion and the  amorphic calcifications. 2. No evidence of right breast malignancy. 3. No suspicious or enlarged lymph nodes.  RECOMMENDATION: Unless left mastectomy is planned, biopsy of the upper outer quadrant of the left breast (where there is distortion and pleomorphic calcifications, as well as the dominant area of the abnormal MRI enhancement) under stereotactic guidance would be recommended to confirm extent of disease.  BI-RADS CATEGORY  5: Highly suggestive of malignancy.   Electronically Signed   By: Lajean Manes M.D.   On: 12/23/2014 11:23    Mm Diag Breast Tomo Uni Left 12/09/2014  FINDINGS: There is a large area of distortion located within the superior left breast extending from approximately the 10 o'clock position to the 2 o'clock position. There are numerous variably sized and shaped calcifications seen within the superior left breast which span 8 cm.  Mammographic images were processed with CAD.  On physical exam, there is a large, firm, palpable mass occupying the majority of the superior portion of the left breast extending from approximately the 9:30 o'clock position to the 2:30 o'clock position centered at approximately the 11:30 o'clock position. By physical examination this measures approximately 10 cm in size.  There is no palpable left axillary adenopathy.  Targeted ultrasound is performed, showing a diffuse, large, irregular, hypoechoic mass with shadowing located within the superior left breast extending from the 9:30 o'clock position to approximately the 2:30 o'clock position and centered at approximately the 11:30 o'clock position 4 cm from the nipple. This corresponds to the large palpable mass. This is difficult to size accurately by ultrasound but measures approximately 8 cm in greatest dimension. This is a suspicious mass. Tissue sampling via ultrasound-guided core biopsy of 2 separate areas is recommended (to determine extent of disease).  Ultrasound of the left axilla demonstrates no  adenopathy and normal axillary contents.  IMPRESSION: Large palpable mass associated with distortion, shadowing, and numerous microcalcifications. Ultrasound-guided core biopsy of 2 sites is recommended. This is scheduled for Friday 12/13/2014.  RECOMMENDATION: Left breast ultrasound-guided core biopsies as discussed above.  I have discussed the findings and recommendations with the patient. Results were also provided in writing at the conclusion of the visit. If applicable, a reminder letter will be sent to the patient regarding the next appointment.  BI-RADS CATEGORY  5: Highly suggestive of malignancy.   Electronically Signed   By: Altamese Cabal M.D.   On: 12/09/2014 16:34   [place] Lt Breast Bx W Loc Dev 1st Lesion Img Bx Spec US Guide  12/18/2014   ADDENDUM REPORT: 12/18/2014 13:16  ADDENDUM: The pathology revealed invasive mammary carcinoma at both the  9:30 o'clock and 11:30 o'clock biopsy sites. This is found to be concordant with imaging findings. I discussed the results over the phone with the patient. The patient states she is doing well post biopsy without complications. Recommend MRI of the breast and [facility]. The patient was given the appointment dates for both.   Electronically Signed   By: Abelardo Diesel M.D.   On: 12/18/2014 13:16   12/18/2014   CLINICAL DATA:  Hypoechoic lesions left breast for biopsy.  EXAM: ULTRASOUND GUIDED LEFT BREAST CORE NEEDLE BIOPSIES  COMPARISON:  Previous exam(s).  FINDINGS: I met with the patient and we discussed the procedure of ultrasound-guided biopsy, including benefits and alternatives. We discussed the high likelihood of a successful procedure. We discussed the risks of the procedure, including infection, bleeding, tissue injury, clip migration, and inadequate sampling. Informed written consent was given. The usual time-out protocol was performed immediately prior to the procedure.  Using sterile technique and 2% Lidocaine as local anesthetic, under direct  ultrasound visualization, a 14 gauge spring-loaded device was used to perform biopsy of hypoechoic area of left breast 11:30 o'clock using a medial approach. At the conclusion of the procedure a heart shaped tissue marker clip was deployed into the biopsy cavity. Follow up 2 view mammogram was performed and dictated separately.  Using sterile technique and 2% Lidocaine as local anesthetic, under direct ultrasound visualization, a 14 gauge spring-loaded device was used to perform biopsy of hypoechoic area of left breast 9:30 o'clock using a medial approach. At the conclusion of the procedure a ribbon tissue marker clip was deployed into the biopsy cavity. Follow up 2 view mammogram was performed and dictated separately.  IMPRESSION: Ultrasound guided biopsies of left breast. No apparent complications.  Electronically Signed: By: Abelardo Diesel M.D. On: 12/13/2014 16:24    ASSESSMENT & PLAN:  59 yo female, with PMH of  Hypertension and a mild IBS , otherwise very fit and healthy postmenopausal woman , who was found to have left breast cancer by screening mammogram.  1. CT3N0M0,  Stage IIB,  Invasive lobular carcinoma,  Strongly ER and PR positive, HER-2 negative, Ki67 33-42%, and lobular carcinoma in situ - I reviewed her images findings and biopsy results  In great details with patient and her husband. - she is likely need a mastectomy giving the large size of the tumor,  She was seen by Dr. Dalbert Batman. - giving the large size of the tumor, neoadjuvant therapy is  Preferred to achieve complete surgical resection.   -I discussed her Oncotype results. Her recurrence score is 27, which predicts 18% of recurrence with with tamoxifen alone. It is intermediate risk. Although the benefit of chemotherapy in intermediate risk group remains controversial, given her relatively high score in the intermediate or risk of range, and large size of the tumor, I recommend new adjuvant chemotherapy with 6 cycles of TC (Cytoxan and  docetaxel). -I discussed this with her surgeon Dr. Dalbert Batman and also who agrees with neoadjuvant chemotherapy. -The goal of chemotherapy is curative - Giving the strong ER/PR positivity, I would recommend adjuvant endocrine therapy with aromatase inhibitor. - she is likely also need adjuvant radiation, she was seen by Dr. Pablo Ledger.  -Lab reviewed, mild anemia and transaminase elevation, stable,  adequate for treatment, we'll proceed the last cycle TC today. -Breat MRI in a few weeks -I sent a message to Dr. Dalbert Batman for him to see her to prepare for surgery. She has seen plastic surgeon Dr. Iran Planas   2.  Hypertension - continue medication. Follow-up of his primary care physician  3. Transaminitis -She has slightly elevated liver enzyme ALT and AST, likely related to chemotherapy -resolved   4. Right skin pigmentation -secondary to docetaxel infiltration -I recommend saline flush after she completes docetaxel infusion, and apply ice pack to the infusion site  Plan: -cycle 6 TC today, last cycle chemo  -Breast MRI in a few weeks, see Dr. Dalbert Batman to prepare surgery, likely mastectomy and reconstruction   Patient and her husband had multiple is she taking long I answered to their satisfaction. The patient knows to call the clinic with any problems, questions or concerns.  I spent 20 minutes counseling the patient face to face. The total time spent in the appointment was 25 minutes and more than 50% was on counseling.     Truitt Merle, MD 05/09/2015   2:34 PM

## 2015-05-09 NOTE — Patient Instructions (Signed)
[facility] Discharge Instructions for Patients Receiving Chemotherapy  Today you received the following chemotherapy agents: Taxotere, Cytoxan  To help prevent nausea and vomiting after your treatment, we encourage you to take your nausea medication as prescribed by your physician.   If you develop nausea and vomiting that is not controlled by your nausea medication, call the clinic.   BELOW ARE SYMPTOMS THAT SHOULD BE REPORTED IMMEDIATELY:  *FEVER GREATER THAN 100.5 F  *CHILLS WITH OR WITHOUT FEVER  NAUSEA AND VOMITING THAT IS NOT CONTROLLED WITH YOUR NAUSEA MEDICATION  *UNUSUAL SHORTNESS OF BREATH  *UNUSUAL BRUISING OR BLEEDING  TENDERNESS IN MOUTH AND THROAT WITH OR WITHOUT PRESENCE OF ULCERS  *URINARY PROBLEMS  *BOWEL PROBLEMS  UNUSUAL RASH Items with * indicate a potential emergency and should be followed up as soon as possible.  Feel free to call the clinic you have any questions or concerns. The clinic phone number is [phone].  Please show the [facility] at check-in to the Emergency Department and triage nurse.

## 2015-05-09 NOTE — Telephone Encounter (Signed)
lvm fo rpt regarding to Aug appt....left GI # for pt to call and sched appt....pt will get f/u appt in chemo

## 2015-05-10 ENCOUNTER — Ambulatory Visit: Payer: BLUE CROSS/BLUE SHIELD

## 2015-05-12 ENCOUNTER — Ambulatory Visit (HOSPITAL_BASED_OUTPATIENT_CLINIC_OR_DEPARTMENT_OTHER): Payer: BLUE CROSS/BLUE SHIELD

## 2015-05-12 VITALS — BP 122/77 | HR 110 | Temp 99.1°F

## 2015-05-12 DIAGNOSIS — Z5189 Encounter for other specified aftercare: Secondary | ICD-10-CM

## 2015-05-12 DIAGNOSIS — C50812 Malignant neoplasm of overlapping sites of left female breast: Secondary | ICD-10-CM | POA: Diagnosis not present

## 2015-05-12 DIAGNOSIS — C50212 Malignant neoplasm of upper-inner quadrant of left female breast: Secondary | ICD-10-CM | POA: Diagnosis not present

## 2015-05-12 MED ORDER — PEGFILGRASTIM INJECTION 6 MG/0.6ML ~~LOC~~
6.0000 mg | PREFILLED_SYRINGE | Freq: Once | SUBCUTANEOUS | Status: AC
  Administered 2015-05-12: 6 mg via SUBCUTANEOUS
  Filled 2015-05-12: qty 0.6

## 2015-05-17 ENCOUNTER — Ambulatory Visit
Admission: RE | Admit: 2015-05-17 | Discharge: 2015-05-17 | Disposition: A | Discharge status: Home / Self Care | Payer: BLUE CROSS/BLUE SHIELD | Source: Ambulatory Visit | Attending: Hematology | Admitting: Hematology

## 2015-05-17 DIAGNOSIS — C50212 Malignant neoplasm of upper-inner quadrant of left female breast: Secondary | ICD-10-CM

## 2015-05-17 MED ORDER — GADOBENATE DIMEGLUMINE 529 MG/ML IV SOLN
14.0000 mL | Freq: Once | INTRAVENOUS | Status: AC | PRN
  Administered 2015-05-17: 14 mL via INTRAVENOUS

## 2015-05-19 ENCOUNTER — Other Ambulatory Visit: Payer: Self-pay | Admitting: General Surgery

## 2015-05-19 DIAGNOSIS — C50912 Malignant neoplasm of unspecified site of left female breast: Secondary | ICD-10-CM

## 2015-05-23 ENCOUNTER — Ambulatory Visit (INDEPENDENT_AMBULATORY_CARE_PROVIDER_SITE_OTHER): Payer: BLUE CROSS/BLUE SHIELD | Admitting: Family Medicine

## 2015-05-23 ENCOUNTER — Encounter: Payer: Self-pay | Admitting: Family Medicine

## 2015-05-23 VITALS — BP 120/78 | HR 89 | Temp 98.0°F | Resp 16 | Ht 65.0 in | Wt 160.1 lb

## 2015-05-23 DIAGNOSIS — E785 Hyperlipidemia, unspecified: Secondary | ICD-10-CM

## 2015-05-23 DIAGNOSIS — K449 Diaphragmatic hernia without obstruction or gangrene: Secondary | ICD-10-CM | POA: Insufficient documentation

## 2015-05-23 DIAGNOSIS — I10 Essential (primary) hypertension: Secondary | ICD-10-CM | POA: Diagnosis not present

## 2015-05-23 LAB — BASIC METABOLIC PANEL
BUN: 16 mg/dL (ref 7–25)
CALCIUM: 9 mg/dL (ref 8.6–10.4)
CO2: 26 mmol/L (ref 20–31)
CREATININE: 0.68 mg/dL (ref 0.50–0.99)
Chloride: 102 mmol/L (ref 98–110)
GLUCOSE: 93 mg/dL (ref 65–99)
Potassium: 4.5 mmol/L (ref 3.5–5.3)
Sodium: 138 mmol/L (ref 135–146)

## 2015-05-23 LAB — LIPID PANEL
CHOLESTEROL: 169 mg/dL (ref 125–200)
HDL: 32 mg/dL — AB (ref >=46)
LDL Cholesterol: 93 mg/dL (ref <130)
TRIGLYCERIDES: 219 mg/dL — AB (ref <150)
Total CHOL/HDL Ratio: 5.3 Ratio — ABNORMAL HIGH (ref <=5.0)
VLDL: 44 mg/dL — ABNORMAL HIGH (ref <30)

## 2015-05-23 LAB — HEPATIC FUNCTION PANEL
ALBUMIN: 3.8 g/dL (ref 3.6–5.1)
ALK PHOS: 83 U/L (ref 33–130)
ALT: 27 U/L (ref 6–29)
AST: 26 U/L (ref 10–35)
Bilirubin, Direct: 0.1 mg/dL (ref <=0.2)
Indirect Bilirubin: 0.4 mg/dL (ref 0.2–1.2)
TOTAL PROTEIN: 6.2 g/dL (ref 6.1–8.1)
Total Bilirubin: 0.5 mg/dL (ref 0.2–1.2)

## 2015-05-23 NOTE — Progress Notes (Signed)
   Subjective:    Patient ID: Emily Griffith, female    DOB: 11-16-1955, 59 y.o.   MRN: [id number]  HPI HTN- chronic problem, on Lisinopril.  No CP, SOB, HAs, visual changes, edema.  Hyperlipidemia- chronic problem, on lipitor.  No abd pain, N/V.  GERD- ongoing issue for pt.  Was given script for protonix but has not started it yet.  Pt was dx'd w/ hiatal hernia on MRI and started on PPI by surgery.  She is concerned about possible side effects.   Review of Systems For ROS see HPI     Objective:   Physical Exam  Constitutional: She is oriented to person, place, and time. She appears well-developed and well-nourished. No distress.  HENT:  Head: Normocephalic and atraumatic.  Eyes: Conjunctivae and EOM are normal. Pupils are equal, round, and reactive to light.  Neck: Normal range of motion. Neck supple. No thyromegaly present.  Cardiovascular: Normal rate, regular rhythm, normal heart sounds and intact distal pulses.   No murmur heard. Pulmonary/Chest: Effort normal and breath sounds normal. No respiratory distress.  Abdominal: Soft. She exhibits no distension. There is no tenderness.  Musculoskeletal: She exhibits no edema.  Lymphadenopathy:    She has no cervical adenopathy.  Neurological: She is alert and oriented to person, place, and time.  Skin: Skin is warm and dry.  Psychiatric: She has a normal mood and affect. Her behavior is normal.  Vitals reviewed.         Assessment & Plan:

## 2015-05-23 NOTE — Progress Notes (Signed)
Pre visit review using our clinic review tool, if applicable. No additional management support is needed unless otherwise documented below in the visit note. 

## 2015-05-23 NOTE — Assessment & Plan Note (Signed)
Chronic problem.  Tolerating statin w/o difficulty.  Check labs.  Adjust meds prn  

## 2015-05-23 NOTE — Assessment & Plan Note (Signed)
Chronic problem.  Excellent control.  Asymptomatic.  Check labs.  No anticipated med changes. 

## 2015-05-23 NOTE — Patient Instructions (Signed)
Schedule your complete physical in 6 months We'll notify you of your lab results and make any changes if needed Start the Protonix daily for the next 1-2 months and then stop.  If symptoms return, restart as needed Keep up the good work!  You look great!!! Call with any questions or concerns Happy Labor Day!! GOOD LUCK WITH SURGERY!!!

## 2015-05-23 NOTE — Assessment & Plan Note (Signed)
New.  Pt was dx'd on MRI done to assess breast cancer.  Based on this, surgeon recommended starting PPI for better control of sxs and cough.  Pt has not yet started.  Recommended she do so and encouraged her to take Ca and Vit D daily while on PPI.  Pt expressed understanding and is in agreement w/ plan.

## 2015-05-24 LAB — CBC WITH DIFFERENTIAL/PLATELET
Basophils Absolute: 0 10*3/uL (ref 0.0–0.1)
Basophils Relative: 0 % (ref 0–1)
Eosinophils Absolute: 0 10*3/uL (ref 0.0–0.7)
Eosinophils Relative: 0 % (ref 0–5)
HEMATOCRIT: 33 % — AB (ref 36.0–46.0)
HEMOGLOBIN: 10.9 g/dL — AB (ref 12.0–15.0)
LYMPHS ABS: 0.9 10*3/uL (ref 0.7–4.0)
Lymphocytes Relative: 6 % — ABNORMAL LOW (ref 12–46)
MCH: 31.2 pg (ref 26.0–34.0)
MCHC: 33 g/dL (ref 30.0–36.0)
MCV: 94.6 fL (ref 78.0–100.0)
MONO ABS: 1.1 10*3/uL — AB (ref 0.1–1.0)
MONOS PCT: 8 % (ref 3–12)
MPV: 9 fL (ref 8.6–12.4)
NEUTROS ABS: 12.2 10*3/uL — AB (ref 1.7–7.7)
NEUTROS PCT: 86 % — AB (ref 43–77)
Platelets: 286 10*3/uL (ref 150–400)
RBC: 3.49 MIL/uL — ABNORMAL LOW (ref 3.87–5.11)
RDW: 18 % — ABNORMAL HIGH (ref 11.5–15.5)
WBC: 14.2 10*3/uL — ABNORMAL HIGH (ref 4.0–10.5)

## 2015-05-27 ENCOUNTER — Other Ambulatory Visit: Payer: Self-pay | Admitting: Family Medicine

## 2015-05-27 DIAGNOSIS — D72829 Elevated white blood cell count, unspecified: Secondary | ICD-10-CM

## 2015-05-29 ENCOUNTER — Encounter: Payer: Self-pay | Admitting: *Deleted

## 2015-05-30 ENCOUNTER — Ambulatory Visit (HOSPITAL_BASED_OUTPATIENT_CLINIC_OR_DEPARTMENT_OTHER): Payer: BLUE CROSS/BLUE SHIELD | Admitting: Hematology

## 2015-05-30 ENCOUNTER — Encounter: Payer: Self-pay | Admitting: Hematology

## 2015-05-30 ENCOUNTER — Other Ambulatory Visit (HOSPITAL_BASED_OUTPATIENT_CLINIC_OR_DEPARTMENT_OTHER): Payer: BLUE CROSS/BLUE SHIELD

## 2015-05-30 VITALS — BP 114/74 | HR 98 | Temp 98.2°F | Ht 65.0 in | Wt 161.1 lb

## 2015-05-30 DIAGNOSIS — C50212 Malignant neoplasm of upper-inner quadrant of left female breast: Secondary | ICD-10-CM | POA: Diagnosis not present

## 2015-05-30 DIAGNOSIS — C50812 Malignant neoplasm of overlapping sites of left female breast: Secondary | ICD-10-CM | POA: Diagnosis not present

## 2015-05-30 DIAGNOSIS — I10 Essential (primary) hypertension: Secondary | ICD-10-CM

## 2015-05-30 LAB — COMPREHENSIVE METABOLIC PANEL (CC13)
ALBUMIN: 3.4 g/dL — AB (ref 3.5–5.0)
ALT: 25 U/L (ref 0–55)
AST: 19 U/L (ref 5–34)
Alkaline Phosphatase: 60 U/L (ref 40–150)
Anion Gap: 9 mEq/L (ref 3–11)
BILIRUBIN TOTAL: 0.53 mg/dL (ref 0.20–1.20)
BUN: 12.4 mg/dL (ref 7.0–26.0)
CO2: 27 meq/L (ref 22–29)
CREATININE: 0.7 mg/dL (ref 0.6–1.1)
Calcium: 9.4 mg/dL (ref 8.4–10.4)
Chloride: 105 mEq/L (ref 98–109)
EGFR: >90 mL/min/{1.73_m2} (ref >90)
GLUCOSE: 97 mg/dL (ref 70–140)
Potassium: 3.9 mEq/L (ref 3.5–5.1)
SODIUM: 141 meq/L (ref 136–145)
TOTAL PROTEIN: 6.3 g/dL — AB (ref 6.4–8.3)

## 2015-05-30 LAB — CBC WITH DIFFERENTIAL/PLATELET
BASO%: 1.2 % (ref 0.0–2.0)
Basophils Absolute: 0.1 10*3/uL (ref 0.0–0.1)
EOS%: 0.2 % (ref 0.0–7.0)
Eosinophils Absolute: 0 10*3/uL (ref 0.0–0.5)
HCT: 31.9 % — ABNORMAL LOW (ref 34.8–46.6)
HGB: 10.6 g/dL — ABNORMAL LOW (ref 11.6–15.9)
LYMPH%: 14.1 % (ref 14.0–49.7)
MCH: 31.1 pg (ref 25.1–34.0)
MCHC: 33.1 g/dL (ref 31.5–36.0)
MCV: 93.9 fL (ref 79.5–101.0)
MONO#: 0.6 10*3/uL (ref 0.1–0.9)
MONO%: 13 % (ref 0.0–14.0)
NEUT#: 3.1 10*3/uL (ref 1.5–6.5)
NEUT%: 71.5 % (ref 38.4–76.8)
PLATELETS: 286 10*3/uL (ref 145–400)
RBC: 3.4 10*6/uL — AB (ref 3.70–5.45)
RDW: 19.7 % — ABNORMAL HIGH (ref 11.2–14.5)
WBC: 4.4 10*3/uL (ref 3.9–10.3)
lymph#: 0.6 10*3/uL — ABNORMAL LOW (ref 0.9–3.3)

## 2015-05-30 NOTE — Progress Notes (Signed)
[place]  [phone] Fax:([phone] [place] Note   Patient Care Team: Midge Minium, MD as PCP - General (Family Medicine) Fanny Skates, MD as Consulting Physician (General Surgery) Truitt Merle, MD as Consulting Physician (Hematology) Thea Silversmith, MD as Consulting Physician (Radiation Oncology) Rockwell Germany, RN as Registered Nurse Mauro Kaufmann, RN as Registered Nurse Holley Bouche, NP as Nurse Practitioner (Nurse Practitioner)   CHIEF COMPLAINTS/PURPOSE OF CONSULTATION:  Follow up breast cancer  Oncology History   Breast cancer of upper-inner quadrant of left female breast   Staging form: Breast, AJCC 7th Edition     Clinical stage from 12/25/2014: Stage IIB (T3, N0, M0) - Unsigned       Staging comments: Staged at breast conference on 3.23.16        Breast cancer of upper-inner quadrant of left female breast   12/09/2014 Breast US ultrasound is performed, showing a diffuse, large, irregular, hypoechoic mass with shadowing located within the superior left breast extending from the 9:30 o'clock position to approximately the 2:30 o'clock position and centered at approximately the    12/13/2014 Oncotype testing RS 27, predicted recurrent risk of 18% with tamoxifen alone   12/13/2014 Pathology Results Breast, left, needle core biopsy, 11:30 o'clock - INVASIVE MAMMARY CARCINOMA, SEE COMMENT. - MAMMARY CARCINOMA IN SITU. 2. Breast, left, needle core biopsy, 9:30 o'clock - INVASIVE MAMMARY CARCINOMA, SEE COMMENT   12/13/2014 Receptors her2 Estrogen Receptor: 99%, POSITIVE, STRONG STAINING INTENSITY Progesterone Receptor: 43%, POSITIVE, MODERATE STAINING INTENSITY Proliferation Marker Ki67: 42%  HER-2/NEU BY CISH - NEGATIVE   12/20/2014 Initial Diagnosis Breast cancer of upper-inner quadrant of left female breast   12/23/2014 Breast MRI there is extensive abnormal enhancement throughout much of the left breast suspicious for  additional areas of malignancy. Overall area of this abnormal enhancement covers 8.5 cm x 7 cm x 7.1 cm. Most discrete area of abnormal enhancement lies in the up   01/24/2015 -  Chemotherapy docetaxel 29m/m2, cyclophosphamide 600 mg/m, on day 1 every 21 days.     HISTORY OF PRESENTING ILLNESS:  [name]59y.o. female is here because of newly diagnosed left breast cancer.  This was found by screening mammogram. Her prior screening mammogram was in November 2014 which was negative. Her screening mammogram on 11/25/2014 showed a possible mass and distortion in the left breast. She further underwent diagnostic mammogram and ultrasound on 12/09/2014, which showed a diffuse large irregular hypoechogenic mass extending from 9:30 o'clock position 2-30 o'clock position. Biopsy of this large mass at 2 different positions showed invasive lobular carcinoma.  She denies any symptoms. She feels well overall. She denies any pain, cough, dyspnea, or any GI symptoms. 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 No recent weight loss. No change of her appetite.   She had right breast biopsy was benign. She always has lumpy breast which has not changed per patient. She has IBS, she has dirrhea which is usually triggered by stress, and she takes Imodium as needed.    INTERIM HISTORY: Emily Cookeyreturns for follow-up and discuss her restaging breast MRI findings. She tolerates it lasts cycle chemotherapy well, moderate fatigue, no other issues. She has recovered very well. She is scheduled to have breast surgery on September 7.   MEDICAL HISTORY:  Past Medical History  Diagnosis Date  . Chicken pox   . Hypertension   . Hyperlipidemia   .  IBS (irritable bowel syndrome)   . Breast cancer of upper-inner quadrant of left female breast 12/20/2014    SURGICAL HISTORY: Past Surgical History  Procedure Laterality Date  . Laser ablation of the cervix  1991    SOCIAL  HISTORY: History   Social History  . Marital Status: Married    Spouse Name: N/A  . Number of Children: 0  . Years of Education: N/A   Occupational History  . 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 Smoking status: Never Smoker   . Smokeless tobacco: Not on file  . Alcohol Use: Yes     Comment: social   . Drug Use: No  . Sexual Activity: Not on file   Other Topics Concern  . Not on file   Social History Narrative   GYN HISTORY  Menarchal: 11 LMP: 03/2013  Contraceptive: no HRT: no  G0P0:    FAMILY HISTORY: Family History  Problem Relation Age of Onset  . Hypertension Mother   . Sudden death Father 30    bleeding   . Cancer Maternal Uncle 60    unknown cancer   . Cancer Paternal Uncle 62    unknown cancer     ALLERGIES:  has No Known Allergies.  MEDICATIONS:  Current Outpatient Prescriptions  Medication Sig Dispense Refill  . atorvastatin (LIPITOR) 40 MG tablet TAKE 1 TABLET EVERY NIGHT AT BEDTIME 90 tablet 1  . Cholecalciferol (VITAMIN D) 2000 UNITS CAPS Take 2,000 Units by mouth daily.    Marland Kitchen dicyclomine (BENTYL) 20 MG tablet TAKE 1 TABLET BY MOUTH EVERY 6 HOURS 60 tablet 3  . lisinopril (PRINIVIL,ZESTRIL) 20 MG tablet Pt only takes if systolic is greater than 102    . pantoprazole (PROTONIX) 40 MG tablet TK 1 T PO QD  3  . potassium chloride SA (K-DUR,KLOR-CON) 20 MEQ tablet TAKE 1 TABLET DAILY 90 tablet 1   No current facility-administered medications for this visit.    REVIEW OF SYSTEMS:   Constitutional: Denies fevers, chills or abnormal night sweats Eyes: Denies blurriness of vision, double vision or watery eyes Ears, nose, mouth, throat, and face: Denies mucositis or sore throat Respiratory: Denies cough, dyspnea or wheezes Cardiovascular: Denies palpitation, chest discomfort or lower extremity swelling Gastrointestinal:  Denies nausea, heartburn or change in bowel habits Skin: Denies abnormal skin  rashes Lymphatics: Denies new lymphadenopathy or easy bruising Neurological:Denies numbness, tingling or new weaknesses Behavioral/Psych: Mood is stable, no new changes  All other systems were reviewed with the patient and are negative.  PHYSICAL EXAMINATION: ECOG PERFORMANCE STATUS: 0 - Asymptomatic  Filed Vitals:   05/30/15 1256  BP: 114/74  Pulse: 98  Temp: 98.2 F (36.8 C)   Filed Weights   05/30/15 1256  Weight: 161 lb 1.6 oz (73.074 kg)    GENERAL:alert, no distress and comfortable SKIN: skin color, texture, turgor are normal, no rashes or significant lesions EYES: normal, conjunctiva are pink and non-injected, sclera clear OROPHARYNX:no exudate, no erythema and lips, buccal mucosa, and tongue normal  NECK: supple, thyroid normal size, non-tender, without nodularity LYMPH:  no palpable lymphadenopathy in the cervical, axillary or inguinal LUNGS: clear to auscultation and percussion with normal breathing effort HEART: regular rate & rhythm and no murmurs and no lower extremity edema ABDOMEN:abdomen soft, non-tender and normal bowel sounds Musculoskeletal:no cyanosis of digits and no clubbing  PSYCH: alert & oriented x 3 with fluent speech NEURO: no focal motor/sensory deficits Breasts: Breast inspection  showed them to be symmetrical with no nipple discharge. There is some fullness in the left upper mid and outer quadrant of left breast, but no discrete mass (initial mass measuring 9X6.5cm),  palpitation of the right breast and axilla revealed no obvious mass that I could appreciate.   LABORATORY DATA:  I have reviewed the data as listed CBC Latest Ref Rng 05/30/2015 05/23/2015 05/09/2015  WBC 3.9 - 10.3 10e3/uL 4.4 14.2(H) 5.6  Hemoglobin 11.6 - 15.9 g/dL 10.6(L) 10.9(L) 10.7(L)  Hematocrit 34.8 - 46.6 % 31.9(L) 33.0(L) 32.5(L)  Platelets 145 - 400 10e3/uL 286 286 324    CMP Latest Ref Rng 05/30/2015 05/23/2015 05/09/2015  Glucose 70 - 140 mg/dl 97 93 104  BUN 7.0 - 26.0  mg/dL 12.4 16 10.2  Creatinine 0.6 - 1.1 mg/dL 0.7 0.68 0.6  Sodium 136 - 145 mEq/L 141 138 141  Potassium 3.5 - 5.1 mEq/L 3.9 4.5 3.4(L)  Chloride 98 - 110 mmol/L - 102 -  CO2 22 - 29 mEq/L _0 Calcium 8.4 - 10.4 mg/dL 9.4 9.0 9.2  Total Protein 6.4 - 8.3 g/dL 6.3(L) 6.2 6.6  Total Bilirubin 0.20 - 1.20 mg/dL 0.53 0.5 0.60  Alkaline Phos 40 - 150 U/L 60 83 67  AST 5 - 34 U/L _1 ALT 0 - 55 U/L _2 PATHOLOGY REPORT 12/13/2014 1. Breast, left, needle core biopsy, 11:30 o'clock - INVASIVE MAMMARY CARCINOMA, SEE COMMENT. - MAMMARY CARCINOMA IN SITU. 2. Breast, left, needle core biopsy, 9:30 o'clock - INVASIVE MAMMARY CARCINOMA, SEE COMMENT. Microscopic Comment 1. and 2. There is absence of myoepithelial layer demonstrated (smooth muscle myosin heavy chain, calponin, and p63 immunostains). Both the in situ and invasive carcinoma demonstrate absence of E-cadherin expression; supporting a lobular phenotype. Breast prognostic studies are pending and will be reported in an addendum. The case was reviewed with Dr. Avis Epley who concurs.  2. PROGNOSTIC INDICATORS - ACIS Results: IMMUNOHISTOCHEMICAL AND MORPHOMETRIC ANALYSIS BY THE AUTOMATED CELLULAR IMAGING SYSTEM (ACIS) (BLOCK 2A) Estrogen Receptor: 99%, POSITIVE, STRONG STAINING INTENSITY Progesterone Receptor: 71%, POSITIVE, STRONG STAINING INTENSITY Proliferation Marker Ki67: 33%  CHROMOGENIC IN-SITU HYBRIDIZATION Results: 1A HER-2/NEU BY CISH - NEGATIVE. RESULT RATIO OF HER2: CEP 17 SIGNALS 1.84 AVERAGE HER2 COPY NUMBER PER CELL 3.50  1. PROGNOSTIC INDICATORS - ACIS Results: IMMUNOHISTOCHEMICAL AND MORPHOMETRIC ANALYSIS BY THE AUTOMATED CELLULAR IMAGING SYSTEM (ACIS) (BLOCK 1A) Estrogen Receptor: 99%, POSITIVE, STRONG STAINING INTENSITY Progesterone Receptor: 43%, POSITIVE, MODERATE STAINING INTENSITY Proliferation Marker Ki67: 42%  RADIOGRAPHIC STUDIES: I have personally reviewed the radiological images  as listed and agreed with the findings in the report.  Breast MRI 05/17/2059 IMPRESSION: 1. Marked interval decrease in the previously noted extensive enhancement throughout the left breast, compatible with response to treatment. No evidence of malignancy within the right breast. 2. Nonspecific focus of skin enhancement along the medial aspect of the left breast. 3. Incidentally noted hiatal hernia with possible asymmetric wall thickening.   ASSESSMENT & PLAN:  59 yo female, with PMH of  Hypertension and a mild IBS , otherwise very fit and healthy postmenopausal woman , who was found to have left breast cancer by screening mammogram.  1. CT3N0M0,  Stage IIB,  Invasive lobular carcinoma,  Strongly ER and PR positive, HER-2 negative, Ki67 33-42%, and lobular carcinoma in situ - I reviewed her images findings and biopsy results  In great details with patient and her husband. - she is likely need a mastectomy giving the  large size of the tumor,  She was seen by Dr. Dalbert Batman. - giving the large size of the tumor, neoadjuvant therapy is  Preferred to achieve complete surgical resection.   -I discussed her Oncotype results. Her recurrence score is 27, which predicts 18% of recurrence with with tamoxifen alone. It is intermediate risk. Although the benefit of chemotherapy in intermediate risk group remains controversial, given her relatively high score in the intermediate or risk of range, and large size of the tumor, I recommend new adjuvant chemotherapy with 6 cycles of TC (Cytoxan and docetaxel). -I discussed this with her surgeon Dr. Dalbert Batman and also who agrees with neoadjuvant chemotherapy. -The goal of chemotherapy is curative - Giving the strong ER/PR positivity, I would recommend adjuvant endocrine therapy with aromatase inhibitor. - she is likely also need adjuvant radiation, she was seen by Dr. Pablo Ledger, she will follow up after surgery. -She has now completed new adjuvant chemotherapy,  restaging breast MRI showed marked response. She tolerated treatment very well. -She is scheduled to have left breast lumpectomy on September 7. -I'll see her back after she completes the adjuvant radiation therapy.    2.  Hypertension - continue medication. Follow-up of his primary care physician  3. Alopecia, secondary to chemotherapy  Plan: -Left breast mastectomy on 06/11/2015 -She will see Dr. Pablo Ledger after surgery -I'll see her back when she completes breast radiation.   Patient and her husband had multiple is she taking long I answered to their satisfaction. The patient knows to call the clinic with any problems, questions or concerns.  I spent 20 minutes counseling the patient face to face. The total time spent in the appointment was 25 minutes and more than 50% was on counseling.     Truitt Merle, MD 05/30/2015   10:53 PM

## 2015-06-03 ENCOUNTER — Encounter (HOSPITAL_COMMUNITY)
Admission: RE | Admit: 2015-06-03 | Discharge: 2015-06-03 | Disposition: A | Discharge status: Home / Self Care | Payer: BLUE CROSS/BLUE SHIELD | Source: Ambulatory Visit | Attending: General Surgery | Admitting: General Surgery

## 2015-06-03 ENCOUNTER — Encounter (HOSPITAL_COMMUNITY): Payer: Self-pay

## 2015-06-03 DIAGNOSIS — R9431 Abnormal electrocardiogram [ECG] [EKG]: Secondary | ICD-10-CM | POA: Diagnosis not present

## 2015-06-03 DIAGNOSIS — E785 Hyperlipidemia, unspecified: Secondary | ICD-10-CM | POA: Insufficient documentation

## 2015-06-03 DIAGNOSIS — I10 Essential (primary) hypertension: Secondary | ICD-10-CM | POA: Diagnosis not present

## 2015-06-03 DIAGNOSIS — Z79899 Other long term (current) drug therapy: Secondary | ICD-10-CM | POA: Insufficient documentation

## 2015-06-03 DIAGNOSIS — C50919 Malignant neoplasm of unspecified site of unspecified female breast: Secondary | ICD-10-CM | POA: Insufficient documentation

## 2015-06-03 DIAGNOSIS — Z01818 Encounter for other preprocedural examination: Secondary | ICD-10-CM | POA: Diagnosis not present

## 2015-06-03 HISTORY — DX: Personal history of other diseases of the digestive system: Z87.19

## 2015-06-03 MED ORDER — CEFAZOLIN SODIUM-DEXTROSE 2-3 GM-% IV SOLR: 2.0000 g | INTRAVENOUS | Status: DC

## 2015-06-03 NOTE — Progress Notes (Signed)
PCP - Dr. Annye Asa Cardiologist - denies  EKG- 06/03/15- Epic CXR - denies Echo/Stress Test/Cardiac Cath - denies  Pt. Denies shortness of breath and chest pain at PAT appointment.

## 2015-06-03 NOTE — Pre-Procedure Instructions (Signed)
    Emily Griffith  06/03/2015      [id number] - [place], [address] AT [facility] OF [address] [place] [address] Phone: [phone] Fax: [phone]  [facility] [place], [place] [address] [place] [address] Phone: [phone] Fax: [phone]    Your procedure is scheduled on Wednesday, September 7th, 2016.  Report to [facility] Admitting at 6:30 A.M.  Call this number if you have problems the morning of surgery:  [phone]   Remember:  Do not eat food or drink liquids after midnight.   Take these medicines the morning of surgery with A SIP OF WATER: Dicyclomine (Bentyl), Pantoprazole (Protonix).  Stop taking: NSAIDS, Aspirin, Aleve, Naproxen, Ibuprofen, Motrin, Advil, BC's, Goody's, Fish Oil, all herbal medications and all vitamins.    Do not wear jewelry, make-up, or nail polish.  Do not wear lotions, powders, or perfumes.  You may NOT wear deodorant.  Do not shave 48 hours prior to surgery.    Do not bring valuables to the hospital.  [facility] is not responsible for any belongings or valuables.  Contacts, dentures or bridgework may not be worn into surgery.  Leave your suitcase in the car.  After surgery it may be brought to your room.  For patients admitted to the hospital, discharge time will be determined by your treatment team.  Patients discharged the day of surgery will not be allowed to drive home.   Special instructions:  See attached.   Please read over the following fact sheets that you were given. Pain Booklet, Coughing and Deep Breathing and Surgical Site Infection Prevention

## 2015-06-03 NOTE — Progress Notes (Addendum)
Anesthesia Chart Review:  Pt is 59 year old female scheduled for L total mastectomy with L sentinel node biopsy on 06/11/2015 with Dr. Dalbert Batman.   PMH includes: HTN, hyperlipidemia, breast cancer. Never smoker. BMI 27  Medications include: lipitor, lisinopril, protonix, potassium  Preoperative labs reviewed.   EKG 06/03/2015: NSR. Non-specific ST-t changes  Willeen Cass, FNP-BC [facility] Short Stay Surgical Center/Anesthesiology Phone: [phone] 06/04/2015 1:08 PM

## 2015-06-04 ENCOUNTER — Other Ambulatory Visit (HOSPITAL_COMMUNITY): Payer: BLUE CROSS/BLUE SHIELD

## 2015-06-09 NOTE — H&P (Signed)
Emily Griffith  Location: [facility] Patient #: [id number] DOB: Jun 13, 1956 Married / Language: English / Race: White Female        History of Present Illness   The patient is a 59 year old female who presents with breast cancer. She returns following neoadjuvant chemotherapy to discuss and plan definitive surgery for her left breast cancer. She was initially evaluated in [facility] on December 25, 2014 for a large cancer of the left breast. Initial imaging studies and physical exam showed a large palpable mass in the left breast extending from the 9:30 position to the 2:30 position, 10 cm by exam with multiple calcifications. MRI showed diffuse disease throughout the left breast but all of the lymph nodes looked normal and the right breast looks normal. No prior history of breast problems. Family history is negative for breast or ovarian cancer. Image guided biopsy 2 revealed invasive lobular carcinoma, hormone receptor positive, HER-2 negative. She has had 6 cycles of cytotoxic chemotherapy and Dr. Burr Medico has referred her back to me for definitive surgery. Chemotherapy was done through a peripheral vein and she did not require a port. The end of treatment MRI shows markedly decreased enhancement throughout the left breast. I have given her copies of all of her reports. I have advised her that she is not a candidate for lumpectomy because of multi-quadrant disease. I have explained that she needs a left total mastectomy and sentinel lymph node biopsy. She has seen Dr. Iran Planas who has advised delayed reconstruction because she is going to have left chest wall radiation therapy with Dr. Pablo Ledger. She understands all these issues. 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 Her job is important to her due to her level of responsibility. She has been working during her chemotherapy and we talked about how this might affect her job temporarily  following mastectomy. She is comfortable with all of this. We've gone over all of these scenarios. She knows why she is not a candidate for lumpectomy. She does know she cannot have a nipple sparing mastectomy since she will not have immediate reconstruction. She knows that she can consider reconstruction at a later date and that is a good idea. She asked about contralateral prophylactic mastectomy and I told her that that was an option but I did not encourage that since she is average risk. I told her we can keep that conversation open if she chose. Her end of treatment MRI also shows a small hiatal hernia. I questioned her about symptoms. No dysphagia. No heartburn. No water brash. She does have a chronic cough. I told her that she should discuss her hiatal hernia with her PCP, but in the meantime I'm going to put her on Protonix. She will be scheduled for left total mastectomy and left axillary sentinel node biopsy. She is aware that she'll spend the night in the hospital and have one or 2 drains. She knows she will go home with the drains. I discussed the indications, details, techniques, and numerous risk of the surgery with her and her husband. She is aware of the risk of bleeding, infection, arm swelling, arm numbness, shoulder disability, need for physical therapy, and other unforeseen problems. She understands all of these issues. All of her questions were answered. She agrees with this plan.   Allergies  No Known Drug Allergies05/31/2016  Medication History  Atorvastatin Calcium (40MG Tablet, Oral) Active. Lisinopril-Hydrochlorothiazide (20-12.5MG Tablet, Oral) Active. Potassium Chloride Crys ER (20MEQ Tablet ER, Oral) Active. Vitamin  D (2000UNIT Capsule, Oral) Active. Compazine (10MG Tablet, Oral) Active. Medications Reconciled  Vitals   Weight: 160 lb Height: 64in Body Surface Area: 1.81 m Body Mass Index: 27.46  kg/m Temp.: 98.65F(Temporal)  Pulse: 77 (Regular)  BP: 130/80 (Sitting, Left Arm, Standard)    Physical Exam  General Note: Alert. No distress. Husband is with her. Good insight and intelligence. Appropriate. Very cooperative. Asked lots of very good questions.   Head and Neck Note: No adenopathy or mass.   Chest and Lung Exam Note: Clear to auscultation bilaterally. No chest wall tenderness   Breast Note: Left breast reveals healthy skin and the tissues are soft. There is still a large area of firmness superiorly and centrally but much less prominent than before. Nipple and areola complex looks normal. Right breast is a little lumpy but no mass or abnormality. No axillary adenopathy on either side.   Cardiovascular Note: Regular rate and rhythm. No murmur. No ectopy.     Assessment & Plan  CANCER OF CENTRAL PORTION OF LEFT BREAST (174.1  C50.112) Impression: Multi-quadrant disease defined by pretreatment MRI.   Schedule for Surgery Your end of treatment MRI shows significantly decreased enhancement in the left breast. you still probably have cancer cells throughout the left breast, as was suggested by your initial biopsies and MRI You have seen a plastic surgeon, and since you are going to get radiation therapy they have advised delayed reconstruction I do not think that you are a candidate for lumpectomy you'll be scheduled for left total mastectomy with left axillary sentinel lymph node biopsy We have discussed the indications, techniques, and numerous risk of the surgery in detail Please read the printed information that I gave you Pt Education - Breast Removal (Mastectomy): cancer HISTORY OF CHEMOTHERAPY (V87.41  Z92.21) Impression: Neoadjuvant chemotherapy completed August 5  HIATAL HERNIA (553.3  K44.9) Impression: Seen on MRI. Denies heartburn or reflux, but does have chronic cough. Current Plans A hiatal hernia was suggested on your MRI you  did not have any significant symptoms of hiatal hernia or reflux, but you do have a cough In the short-term I am going to put you on a proton pump inhibitor call Protonix Please discuss this with your primary care physician  Started Protonix 40MG, 1 (one) Tablet DR daily, #30, 30 days starting 05/19/2015, Ref. X3.   HYPERTENSION, BENIGN (401.1  I10) HISTORY OF IBS (V12.79  Z87.19) HYPERLIPIDEMIA, ACQUIRED (272.4  E78.5)    Brondon Wann M. Dalbert Batman, M.D., [facility] General and Minimally invasive Surgery Breast and Colorectal Surgery Office:   [phone] Pager:   [phone]

## 2015-06-10 ENCOUNTER — Telehealth: Payer: Self-pay | Admitting: *Deleted

## 2015-06-10 ENCOUNTER — Other Ambulatory Visit: Payer: BLUE CROSS/BLUE SHIELD

## 2015-06-10 NOTE — Telephone Encounter (Signed)
xxxxx 

## 2015-06-11 ENCOUNTER — Ambulatory Visit (HOSPITAL_COMMUNITY): Payer: BLUE CROSS/BLUE SHIELD | Admitting: Emergency Medicine

## 2015-06-11 ENCOUNTER — Encounter (HOSPITAL_COMMUNITY): Payer: Self-pay | Admitting: Certified Registered Nurse Anesthetist

## 2015-06-11 ENCOUNTER — Ambulatory Visit (HOSPITAL_COMMUNITY)
Admission: RE | Admit: 2015-06-11 | Discharge: 2015-06-11 | Disposition: A | Discharge status: Home / Self Care | Payer: BLUE CROSS/BLUE SHIELD | Source: Ambulatory Visit | Attending: General Surgery | Admitting: General Surgery

## 2015-06-11 ENCOUNTER — Observation Stay (HOSPITAL_COMMUNITY)
Admission: RE | Admit: 2015-06-11 | Discharge: 2015-06-12 | Disposition: A | Discharge status: Home / Self Care | Payer: BLUE CROSS/BLUE SHIELD | Source: Ambulatory Visit | Attending: General Surgery | Admitting: General Surgery

## 2015-06-11 ENCOUNTER — Encounter (HOSPITAL_COMMUNITY)
Admission: RE | Disposition: A | Discharge status: Home / Self Care | Payer: Self-pay | Source: Ambulatory Visit | Attending: General Surgery

## 2015-06-11 ENCOUNTER — Ambulatory Visit (HOSPITAL_COMMUNITY): Payer: BLUE CROSS/BLUE SHIELD | Admitting: Certified Registered Nurse Anesthetist

## 2015-06-11 DIAGNOSIS — Z9221 Personal history of antineoplastic chemotherapy: Secondary | ICD-10-CM | POA: Insufficient documentation

## 2015-06-11 DIAGNOSIS — C50912 Malignant neoplasm of unspecified site of left female breast: Secondary | ICD-10-CM | POA: Diagnosis not present

## 2015-06-11 DIAGNOSIS — C50212 Malignant neoplasm of upper-inner quadrant of left female breast: Secondary | ICD-10-CM | POA: Diagnosis present

## 2015-06-11 DIAGNOSIS — Z170 Estrogen receptor positive status [ER+]: Secondary | ICD-10-CM | POA: Diagnosis not present

## 2015-06-11 DIAGNOSIS — E785 Hyperlipidemia, unspecified: Secondary | ICD-10-CM | POA: Diagnosis not present

## 2015-06-11 DIAGNOSIS — C50112 Malignant neoplasm of central portion of left female breast: Secondary | ICD-10-CM | POA: Diagnosis present

## 2015-06-11 DIAGNOSIS — I10 Essential (primary) hypertension: Secondary | ICD-10-CM | POA: Diagnosis not present

## 2015-06-11 HISTORY — PX: MASTECTOMY W/ SENTINEL NODE BIOPSY: SHX2001

## 2015-06-11 HISTORY — PX: MASTECTOMY: SHX3

## 2015-06-11 HISTORY — PX: SIMPLE MASTECTOMY WITH AXILLARY SENTINEL NODE BIOPSY: SHX6098

## 2015-06-11 LAB — CREATININE, SERUM
Creatinine, Ser: 0.59 mg/dL (ref 0.44–1.00)
GFR calc non Af Amer: >60 mL/min (ref >60)

## 2015-06-11 LAB — CBC
HEMATOCRIT: 32.7 % — AB (ref 36.0–46.0)
HEMOGLOBIN: 10.2 g/dL — AB (ref 12.0–15.0)
MCH: 30.1 pg (ref 26.0–34.0)
MCHC: 31.2 g/dL (ref 30.0–36.0)
MCV: 96.5 fL (ref 78.0–100.0)
Platelets: 244 10*3/uL (ref 150–400)
RBC: 3.39 MIL/uL — AB (ref 3.87–5.11)
RDW: 17.1 % — ABNORMAL HIGH (ref 11.5–15.5)
WBC: 8.8 10*3/uL (ref 4.0–10.5)

## 2015-06-11 SURGERY — SIMPLE MASTECTOMY WITH AXILLARY SENTINEL NODE BIOPSY
Anesthesia: Regional | Site: Breast | Laterality: Left

## 2015-06-11 MED ORDER — PROPOFOL 10 MG/ML IV BOLUS
INTRAVENOUS | Status: AC
  Filled 2015-06-11: qty 20

## 2015-06-11 MED ORDER — EPHEDRINE SULFATE 50 MG/ML IJ SOLN
INTRAMUSCULAR | Status: AC
  Filled 2015-06-11: qty 1

## 2015-06-11 MED ORDER — METHYLENE BLUE 1 % INJ SOLN
INTRAMUSCULAR | Status: AC
  Filled 2015-06-11: qty 10

## 2015-06-11 MED ORDER — ATORVASTATIN CALCIUM 40 MG PO TABS
40.0000 mg | ORAL_TABLET | Freq: Every day | ORAL | Status: DC
  Administered 2015-06-11: 40 mg via ORAL
  Filled 2015-06-11: qty 1

## 2015-06-11 MED ORDER — LACTATED RINGERS IV SOLN
INTRAVENOUS | Status: DC | PRN
  Administered 2015-06-11 (×2): via INTRAVENOUS

## 2015-06-11 MED ORDER — HYDROMORPHONE HCL 1 MG/ML IJ SOLN: 1.0000 mg | INTRAMUSCULAR | Status: DC | PRN

## 2015-06-11 MED ORDER — SODIUM CHLORIDE 0.9 % IJ SOLN
INTRAMUSCULAR | Status: DC | PRN
  Administered 2015-06-11: 5 mL

## 2015-06-11 MED ORDER — OXYCODONE-ACETAMINOPHEN 5-325 MG PO TABS
1.0000 | ORAL_TABLET | ORAL | Status: DC | PRN
  Administered 2015-06-11 – 2015-06-12 (×3): 1 via ORAL
  Filled 2015-06-11 (×2): qty 1

## 2015-06-11 MED ORDER — PHENYLEPHRINE HCL 10 MG/ML IJ SOLN
INTRAMUSCULAR | Status: DC | PRN
  Administered 2015-06-11 (×9): 80 ug via INTRAVENOUS

## 2015-06-11 MED ORDER — ONDANSETRON 4 MG PO TBDP: 4.0000 mg | ORAL_TABLET | Freq: Four times a day (QID) | ORAL | Status: DC | PRN

## 2015-06-11 MED ORDER — FENTANYL CITRATE (PF) 250 MCG/5ML IJ SOLN
INTRAMUSCULAR | Status: AC
  Filled 2015-06-11: qty 5

## 2015-06-11 MED ORDER — LIDOCAINE HCL (CARDIAC) 20 MG/ML IV SOLN
INTRAVENOUS | Status: AC
  Filled 2015-06-11: qty 5

## 2015-06-11 MED ORDER — MIDAZOLAM HCL 5 MG/5ML IJ SOLN
INTRAMUSCULAR | Status: DC | PRN
  Administered 2015-06-11 (×2): 1 mg via INTRAVENOUS

## 2015-06-11 MED ORDER — LIDOCAINE HCL (CARDIAC) 20 MG/ML IV SOLN
INTRAVENOUS | Status: DC | PRN
  Administered 2015-06-11: 100 mg via INTRAVENOUS

## 2015-06-11 MED ORDER — STERILE WATER FOR INJECTION IJ SOLN
INTRAMUSCULAR | Status: AC
  Filled 2015-06-11: qty 10

## 2015-06-11 MED ORDER — FENTANYL CITRATE (PF) 100 MCG/2ML IJ SOLN
INTRAMUSCULAR | Status: AC
  Filled 2015-06-11: qty 2

## 2015-06-11 MED ORDER — MIDAZOLAM HCL 2 MG/2ML IJ SOLN
INTRAMUSCULAR | Status: AC
  Filled 2015-06-11: qty 4

## 2015-06-11 MED ORDER — LISINOPRIL 20 MG PO TABS
20.0000 mg | ORAL_TABLET | Freq: Every day | ORAL | Status: DC
  Administered 2015-06-11: 20 mg via ORAL
  Filled 2015-06-11: qty 1

## 2015-06-11 MED ORDER — FENTANYL CITRATE (PF) 100 MCG/2ML IJ SOLN
INTRAMUSCULAR | Status: DC | PRN
  Administered 2015-06-11 (×3): 50 ug via INTRAVENOUS
  Administered 2015-06-11 (×3): 25 ug via INTRAVENOUS

## 2015-06-11 MED ORDER — DEXAMETHASONE SODIUM PHOSPHATE 4 MG/ML IJ SOLN
INTRAMUSCULAR | Status: AC
  Filled 2015-06-11: qty 2

## 2015-06-11 MED ORDER — ROCURONIUM BROMIDE 50 MG/5ML IV SOLN
INTRAVENOUS | Status: AC
  Filled 2015-06-11: qty 1

## 2015-06-11 MED ORDER — POTASSIUM CHLORIDE CRYS ER 20 MEQ PO TBCR
20.0000 meq | EXTENDED_RELEASE_TABLET | Freq: Every day | ORAL | Status: DC
  Administered 2015-06-11 – 2015-06-12 (×2): 20 meq via ORAL
  Filled 2015-06-11 (×2): qty 1

## 2015-06-11 MED ORDER — SODIUM CHLORIDE 0.9 % IJ SOLN
INTRAMUSCULAR | Status: AC
  Filled 2015-06-11: qty 10

## 2015-06-11 MED ORDER — PROPOFOL 10 MG/ML IV BOLUS
INTRAVENOUS | Status: DC | PRN
  Administered 2015-06-11: 200 mg via INTRAVENOUS

## 2015-06-11 MED ORDER — FENTANYL CITRATE (PF) 100 MCG/2ML IJ SOLN
25.0000 ug | INTRAMUSCULAR | Status: DC | PRN
  Administered 2015-06-11 (×2): 50 ug via INTRAVENOUS

## 2015-06-11 MED ORDER — DEXAMETHASONE SODIUM PHOSPHATE 4 MG/ML IJ SOLN
INTRAMUSCULAR | Status: DC | PRN
  Administered 2015-06-11: 8 mg via INTRAVENOUS

## 2015-06-11 MED ORDER — POTASSIUM CHLORIDE IN NACL 20-0.9 MEQ/L-% IV SOLN
INTRAVENOUS | Status: DC
  Administered 2015-06-11 (×2): via INTRAVENOUS
  Filled 2015-06-11 (×2): qty 1000

## 2015-06-11 MED ORDER — TECHNETIUM TC 99M SULFUR COLLOID FILTERED
1.0000 | Freq: Once | INTRAVENOUS | Status: AC | PRN
  Administered 2015-06-11: 1 via INTRADERMAL

## 2015-06-11 MED ORDER — PANTOPRAZOLE SODIUM 40 MG PO TBEC
40.0000 mg | DELAYED_RELEASE_TABLET | Freq: Every day | ORAL | Status: DC
  Administered 2015-06-12: 40 mg via ORAL
  Filled 2015-06-11 (×2): qty 1

## 2015-06-11 MED ORDER — CEFAZOLIN SODIUM-DEXTROSE 2-3 GM-% IV SOLR
INTRAVENOUS | Status: AC
  Administered 2015-06-11: 2 g via INTRAVENOUS
  Filled 2015-06-11: qty 50

## 2015-06-11 MED ORDER — PHENYLEPHRINE 40 MCG/ML (10ML) SYRINGE FOR IV PUSH (FOR BLOOD PRESSURE SUPPORT)
PREFILLED_SYRINGE | INTRAVENOUS | Status: AC
  Filled 2015-06-11: qty 10

## 2015-06-11 MED ORDER — 0.9 % SODIUM CHLORIDE (POUR BTL) OPTIME
TOPICAL | Status: DC | PRN
  Administered 2015-06-11: 1000 mL

## 2015-06-11 MED ORDER — OXYCODONE-ACETAMINOPHEN 5-325 MG PO TABS
ORAL_TABLET | ORAL | Status: AC
  Administered 2015-06-11: 1 via ORAL
  Filled 2015-06-11: qty 1

## 2015-06-11 MED ORDER — BUPIVACAINE-EPINEPHRINE (PF) 0.5% -1:200000 IJ SOLN
INTRAMUSCULAR | Status: DC | PRN
  Administered 2015-06-11: 30 mL

## 2015-06-11 MED ORDER — ONDANSETRON HCL 4 MG/2ML IJ SOLN
INTRAMUSCULAR | Status: AC
  Filled 2015-06-11: qty 2

## 2015-06-11 MED ORDER — CHLORHEXIDINE GLUCONATE 4 % EX LIQD: 1.0000 "application " | Freq: Once | CUTANEOUS | Status: DC

## 2015-06-11 MED ORDER — ONDANSETRON HCL 4 MG/2ML IJ SOLN: 4.0000 mg | Freq: Four times a day (QID) | INTRAMUSCULAR | Status: DC | PRN

## 2015-06-11 MED ORDER — LACTATED RINGERS IV SOLN: INTRAVENOUS | Status: DC

## 2015-06-11 MED ORDER — ENOXAPARIN SODIUM 40 MG/0.4ML ~~LOC~~ SOLN: 40.0000 mg | SUBCUTANEOUS | Status: DC

## 2015-06-11 MED ORDER — ONDANSETRON HCL 4 MG/2ML IJ SOLN
INTRAMUSCULAR | Status: DC | PRN
  Administered 2015-06-11: 4 mg via INTRAVENOUS

## 2015-06-11 SURGICAL SUPPLY — 50 items
APPLIER CLIP 9.375 MED OPEN (MISCELLANEOUS) ×2
BINDER BREAST LRG (GAUZE/BANDAGES/DRESSINGS) IMPLANT
BINDER BREAST XLRG (GAUZE/BANDAGES/DRESSINGS) ×2 IMPLANT
BIOPATCH RED 1 DISK 7.0 (GAUZE/BANDAGES/DRESSINGS) ×4 IMPLANT
CANISTER SUCTION 2500CC (MISCELLANEOUS) ×2 IMPLANT
CHLORAPREP W/TINT 26ML (MISCELLANEOUS) ×2 IMPLANT
CLIP APPLIE 9.375 MED OPEN (MISCELLANEOUS) ×1 IMPLANT
CONT SPEC 4OZ CLIKSEAL STRL BL (MISCELLANEOUS) ×6 IMPLANT
COVER PROBE W GEL 5X96 (DRAPES) ×2 IMPLANT
COVER SURGICAL LIGHT HANDLE (MISCELLANEOUS) ×2 IMPLANT
DERMABOND ADVANCED (GAUZE/BANDAGES/DRESSINGS) ×1
DERMABOND ADVANCED .7 DNX12 (GAUZE/BANDAGES/DRESSINGS) ×1 IMPLANT
DEVICE DISSECT PLASMABLAD 3.0S (MISCELLANEOUS) ×1 IMPLANT
DRAIN CHANNEL 19F RND (DRAIN) ×4 IMPLANT
DRAPE CHEST BREAST 15X10 FENES (DRAPES) ×2 IMPLANT
DRSG TEGADERM 2-3/8X2-3/4 SM (GAUZE/BANDAGES/DRESSINGS) ×2 IMPLANT
ELECT BLADE 4.0 EZ CLEAN MEGAD (MISCELLANEOUS) ×2
ELECT CAUTERY BLADE 6.4 (BLADE) ×2 IMPLANT
ELECT REM PT RETURN 9FT ADLT (ELECTROSURGICAL) ×4
ELECTRODE BLDE 4.0 EZ CLN MEGD (MISCELLANEOUS) ×1 IMPLANT
ELECTRODE REM PT RTRN 9FT ADLT (ELECTROSURGICAL) ×2 IMPLANT
EVACUATOR SILICONE 100CC (DRAIN) ×4 IMPLANT
GLOVE BIOGEL PI IND STRL 7.0 (GLOVE) ×2 IMPLANT
GLOVE BIOGEL PI INDICATOR 7.0 (GLOVE) ×2
GLOVE ECLIPSE 6.5 STRL STRAW (GLOVE) ×2 IMPLANT
GLOVE EUDERMIC 7 POWDERFREE (GLOVE) ×2 IMPLANT
GLOVE SURG SS PI 7.0 STRL IVOR (GLOVE) ×2 IMPLANT
GOWN STRL REUS W/ TWL LRG LVL3 (GOWN DISPOSABLE) ×2 IMPLANT
GOWN STRL REUS W/ TWL XL LVL3 (GOWN DISPOSABLE) ×1 IMPLANT
GOWN STRL REUS W/TWL LRG LVL3 (GOWN DISPOSABLE) ×2
GOWN STRL REUS W/TWL XL LVL3 (GOWN DISPOSABLE) ×1
KIT BASIN OR (CUSTOM PROCEDURE TRAY) ×2 IMPLANT
KIT ROOM TURNOVER OR (KITS) ×2 IMPLANT
NEEDLE 18GX1X1/2 (RX/OR ONLY) (NEEDLE) ×2 IMPLANT
NEEDLE HYPO 25GX1X1/2 BEV (NEEDLE) ×2 IMPLANT
NS IRRIG 1000ML POUR BTL (IV SOLUTION) ×4 IMPLANT
PACK GENERAL/GYN (CUSTOM PROCEDURE TRAY) ×2 IMPLANT
PAD ABD 8X10 STRL (GAUZE/BANDAGES/DRESSINGS) ×2 IMPLANT
PAD ARMBOARD 7.5X6 YLW CONV (MISCELLANEOUS) ×2 IMPLANT
PLASMABLADE 3.0S (MISCELLANEOUS) ×2
SPECIMEN JAR X LARGE (MISCELLANEOUS) ×2 IMPLANT
SPONGE GAUZE 4X4 12PLY STER LF (GAUZE/BANDAGES/DRESSINGS) ×2 IMPLANT
SUT ETHILON 3 0 FSL (SUTURE) ×4 IMPLANT
SUT MNCRL AB 4-0 PS2 18 (SUTURE) ×2 IMPLANT
SUT SILK 2 0 FS (SUTURE) ×2 IMPLANT
SUT VIC AB 3-0 SH 18 (SUTURE) ×2 IMPLANT
SYR CONTROL 10ML LL (SYRINGE) ×2 IMPLANT
TOWEL OR 17X24 6PK STRL BLUE (TOWEL DISPOSABLE) ×2 IMPLANT
TOWEL OR 17X26 10 PK STRL BLUE (TOWEL DISPOSABLE) ×2 IMPLANT
TUBE CONNECTING 12X1/4 (SUCTIONS) ×2 IMPLANT

## 2015-06-11 NOTE — Anesthesia Procedure Notes (Addendum)
Anesthesia Regional Block:  Pectoralis block  Pre-Anesthetic Checklist: ,, timeout performed, Correct Patient, Correct Site, Correct Laterality, Correct Procedure, Correct Position, site marked, Risks and benefits discussed,  Surgical consent,  Pre-op evaluation,  At surgeon's request and post-op pain management  Laterality: Left and Upper  Prep: chloraprep       Needles:   Needle Type: Echogenic Stimulator Needle     Needle Length: 9cm 9 cm Needle Gauge: 21 and 21 G  Needle insertion depth: 5 cm   Additional Needles:  Procedures: ultrasound guided (picture in chart) Pectoralis block Narrative:  Start time: 06/11/2015 8:15 AM End time: 06/11/2015 8:30 AM Injection made incrementally with aspirations every 5 mL.  Performed by: Personally  Anesthesiologist: MASSAGEE, TERRY  Additional Notes: Tolerated well   Procedure Name: LMA Insertion Date/Time: 06/11/2015 8:48 AM Performed by: Garrison Columbus T Pre-anesthesia Checklist: Patient identified, Emergency Drugs available, Suction available and Patient being monitored Patient Re-evaluated:Patient Re-evaluated prior to inductionOxygen Delivery Method: Circle system utilized Preoxygenation: Pre-oxygenation with 100% oxygen Intubation Type: IV induction LMA: LMA inserted LMA Size: 4.0 Number of attempts: 1 Placement Confirmation: positive ETCO2 and breath sounds checked- equal and bilateral Tube secured with: Tape Dental Injury: Teeth and Oropharynx as per pre-operative assessment

## 2015-06-11 NOTE — Transfer of Care (Signed)
Immediate Anesthesia Transfer of Care Note  Patient: Emily Griffith  Procedure(s) Performed: Procedure(s): LEFT TOTAL MASTECTOMY WITH LEFT SENTINEL NODE BIOPSY (Left)  Patient Location: PACU  Anesthesia Type:General and Regional  Level of Consciousness: awake, alert  and oriented  Airway & Oxygen Therapy: Patient Spontanous Breathing and Patient connected to nasal cannula oxygen  Post-op Assessment: Report given to RN, Post -op Vital signs reviewed and stable and Patient moving all extremities X 4  Post vital signs: Reviewed and stable  Last Vitals:  Filed Vitals:   06/11/15 0711  BP: 126/72  Pulse: 60  Temp: 36.5 C  Resp: 18    Complications: No apparent anesthesia complications

## 2015-06-11 NOTE — Anesthesia Postprocedure Evaluation (Signed)
Anesthesia Post Note  Patient: Emily Griffith  Procedure(s) Performed: Procedure(s) (LRB): LEFT TOTAL MASTECTOMY WITH LEFT SENTINEL NODE BIOPSY (Left)  Anesthesia type: general  Patient location: PACU  Post pain: Pain level controlled  Post assessment: Patient's Cardiovascular Status Stable  Last Vitals:  Filed Vitals:   06/11/15 1157  BP: 117/69  Pulse: 65  Temp: 36.5 C  Resp: 13    Post vital signs: Reviewed and stable  Level of consciousness: sedated  Complications: No apparent anesthesia complications

## 2015-06-11 NOTE — Op Note (Addendum)
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 Dalbert Batman, M.D., FACS  Assistant:                      OR staff  Operative Indications:   The patient is a 59 year old female who presents with breast cancer. She returns following neoadjuvant chemotherapy to discuss and plan definitive surgery for her left breast cancer. She was initially evaluated in [facility] on December 25, 2014 for a large cancer of the left breast. Initial imaging studies and physical exam showed a large palpable mass in the left breast extending from the 9:30 position to the 2:30 position, 10 cm by exam with multiple calcifications. MRI showed diffuse disease throughout the left breast but all of the lymph nodes looked normal and the right breast looks normal.  Family history is negative for breast or ovarian cancer. Image guided biopsy 2 revealed invasive lobular carcinoma, hormone receptor positive, HER-2 negative. She has had 6 cycles of cytotoxic chemotherapy and Dr. Burr Medico has referred her back to me for definitive surgery. Chemotherapy was done through a peripheral vein and she did not require a port. The end of treatment MRI shows markedly decreased enhancement throughout the left breast. I have advised her that she is not a candidate for lumpectomy because of multi-quadrant disease. I have explained that she needs a left total mastectomy and sentinel lymph node biopsy. She has seen Dr. Iran Planas who has  advised delayed reconstruction because she is going to have left chest wall radiation therapy with Dr. Pablo Ledger. She understands all these issues. . She knows why she is not a candidate for lumpectomy. She does know she cannot have a nipple sparing mastectomy since she will not have immediate reconstruction. She knows that she can consider reconstruction at a later date and that is a good idea. She will be scheduled for left total mastectomy and left axillary sentinel node biopsy. She is aware that she'll spend the night in the hospital and have one or 2 drains. She knows she will go home with the drains. I discussed the indications, details, techniques, and numerous risk of the  surgery with her and her husband. She is aware of the risk of bleeding, infection, arm swelling, arm numbness, shoulder disability, need for physical therapy, and other unforeseen problems. She understands all of these issues. All of her questions were answered. She agrees with this plan.  Operative Findings:       The left mastectomy technically was uneventful.  I did not feel or identify any gross cancer within the breast or a on the chest wall.  I found 3 sentinel lymph nodes, all of which were of normal size.  Procedure in Detail:          In the holding area of the patient underwent a pectoral block on the left side and also underwent injection of technetium 99 by the nuclear medicine technician.  The patient was taken the operating room and underwent general anesthesia with LMA device.  Surgical timeout was performed and intravenous antibiotics were given.  Following alcohol prep I injected 5 mL of blue dye into the left breast, subareolar area.  This was a mixture of saline and methylene blue in the breast was massaged for a few minutes.       The left breast was then prepped and draped in a sterile fashion.  Using a marking pin I carefully marked a transverse elliptical incision in the usual  fashion.  The incision was made.  I used the plasma blade and raised skin flaps superiorly to the infraclavicular area, laterally to the latissimus dorsi muscle, inferiorly to the anterior rectus sheath and inframammary crease, and medially  to the parasternal area.  I entered the axilla and using the neoprobe found 3 sentinel lymph nodes which were sent to the lab.  I dissected the breast off of the pectoralis major and minor muscles.  I marked the lateral skin margin with a silk suture.  The specimen was removed and sent to the lab.  Hemostasis was excellent and achieved  with electrocautery and a few metal clips.  The wound was irrigated with saline.  Two 81 French Blake drains were placed, one up to the axillary area and one across the skin flaps.  These were brought out through separate stab incisions, sutured in and connected to suction bulbs.  The mastectomy incision was closed in 2 layers, subcutaneous layer with interrupted 3-0 Vicryl and skin with running subcuticular 4-0 Monocryl and Dermabond.  The drains held the charge.  No obvious bleeding.  Clean bandages and breast binder was placed.  The patient tolerated the procedure well was taken to PACU in stable condition.  EBL 75 mL.  Counts correct.  Complications none.     Edsel Petrin. Dalbert Batman, M.D., FACS General and Minimally Invasive Surgery Breast and Colorectal Surgery  06/11/2015 10:27 AM

## 2015-06-11 NOTE — Interval H&P Note (Signed)
History and Physical Interval Note:  06/11/2015 8:24 AM  Emily Griffith  has presented today for surgery, with the diagnosis of cancer left breast  The various methods of treatment have been discussed with the patient and family. After consideration of risks, benefits and other options for treatment, the patient has consented to  Procedure(s): LEFT TOTAL MASTECTOMY WITH LEFT SENTINEL NODE BIOPSY (Left) as a surgical intervention .  The patient's history has been reviewed, patient examined, no change in status, stable for surgery.  I have reviewed the patient's chart and labs.  Questions were answered to the patient's satisfaction.     Adin Hector

## 2015-06-11 NOTE — Anesthesia Preprocedure Evaluation (Addendum)
Anesthesia Evaluation  Patient identified by MRN, date of birth, ID band Patient awake    Reviewed: Allergy & Precautions, H&P , NPO status , Patient's Chart, lab work & pertinent test results  Airway Mallampati: II  TM Distance: >3 FB Neck ROM: full    Dental no notable dental hx. (+) Dental Advisory Given, Teeth Intact   Pulmonary neg pulmonary ROS,    Pulmonary exam normal breath sounds clear to auscultation       Cardiovascular Exercise Tolerance: Good hypertension, Pt. on medications negative cardio ROS Normal cardiovascular exam Rhythm:regular Rate:Normal     Neuro/Psych negative neurological ROS  negative psych ROS   GI/Hepatic negative GI ROS, Neg liver ROS, hiatal hernia, GERD  Medicated,  Endo/Other  negative endocrine ROS  Renal/GU negative Renal ROS  negative genitourinary   Musculoskeletal   Abdominal   Peds  Hematology negative hematology ROS (+) anemia , hgb 10.6   Anesthesia Other Findings   Reproductive/Obstetrics negative OB ROS                             Anesthesia Physical Anesthesia Plan  ASA: II  Anesthesia Plan: General and Regional   Post-op Pain Management: GA combined w/ Regional for post-op pain   Induction: Intravenous  Airway Management Planned: LMA and Oral ETT  Additional Equipment:   Intra-op Plan:   Post-operative Plan: Extubation in OR  Informed Consent: I have reviewed the patients History and Physical, chart, labs and discussed the procedure including the risks, benefits and alternatives for the proposed anesthesia with the patient or authorized representative who has indicated his/her understanding and acceptance.   Dental advisory given  Plan Discussed with: CRNA, Anesthesiologist and Surgeon  Anesthesia Plan Comments:         Anesthesia Quick Evaluation

## 2015-06-12 ENCOUNTER — Encounter (HOSPITAL_COMMUNITY): Payer: Self-pay | Admitting: General Surgery

## 2015-06-12 DIAGNOSIS — C50912 Malignant neoplasm of unspecified site of left female breast: Secondary | ICD-10-CM | POA: Diagnosis not present

## 2015-06-12 MED ORDER — OXYCODONE-ACETAMINOPHEN 7.5-325 MG PO TABS: 1.0000 | ORAL_TABLET | ORAL | Status: DC | PRN

## 2015-06-12 NOTE — Discharge Instructions (Signed)
See above

## 2015-06-12 NOTE — Discharge Summary (Signed)
Patient ID: Emily Griffith [id number] 59 y.o. May 13, 1956  Admit date: 06/11/2015  Discharge date and time: 06/12/2015  Admitting Physician: Adin Hector  Discharge Physician: Adin Hector  Admission Diagnoses: cancer left breast  Discharge Diagnoses: Locally advanced invasive lobular carcinoma left breast, estrogen receptor positive, HER-2 negative.                                         Pretreatment stage TIII, N0                                          Status post neo-adjuvant chemotherapy                                         Hiatal hernia                                         Benign hypertension                                         Hyperlipidemia  Operations: Procedure(s): LEFT TOTAL MASTECTOMY WITH LEFT SENTINEL NODE BIOPSY  Admission Condition: good  Discharged Condition: good  Indication for Admission: The patient is a 59 year old female who presents with breast cancer. She returns following neoadjuvant chemotherapy to discuss and plan definitive surgery for her left breast cancer. She was initially evaluated in [facility] on December 25, 2014 for a large cancer of the left breast. Initial imaging studies and physical exam showed a large palpable mass in the left breast extending from the 9:30 position to the 2:30 position, 10 cm by exam with multiple calcifications. MRI showed diffuse disease throughout the left breast but all of the lymph nodes looked normal and the right breast looks normal.  Family history is negative for breast or ovarian cancer. Image guided biopsy 2 revealed invasive lobular carcinoma, hormone receptor positive, HER-2 negative. She has had 6 cycles of cytotoxic chemotherapy and Dr. Burr Medico has referred her back to me for definitive surgery. Chemotherapy was done through a peripheral vein and she did not require a port. The end of treatment MRI shows markedly decreased enhancement throughout the left breast. I have  advised her that she is not a candidate for lumpectomy because of multi-quadrant disease. I have explained that she needs a left total mastectomy and sentinel lymph node biopsy. She has seen Dr. Iran Planas who has advised delayed reconstruction because she is going to have left chest wall radiation therapy with Dr. Pablo Ledger. She understands all these issues. . She knows why she is not a candidate for lumpectomy. She does know she cannot have a nipple sparing mastectomy since she will not have immediate reconstruction. She knows that she can consider reconstruction at a later date and that is a good idea. She will be scheduled for left total mastectomy and left axillary sentinel node biopsy. She is aware that she'll spend the night in the hospital and have one or 2 drains. She knows she will go home  with the drains. I discussed the indications, details, techniques, and numerous risk of the surgery with her and her husband. She is aware of the risk of bleeding, infection, arm swelling, arm numbness, shoulder disability, need for physical therapy, and other unforeseen problems. She understands all of these issues. All of her questions were answered. She agrees with this plan.  Hospital Course: On the day of admission the patient was taken to the operating room and underwent left total mastectomy and sentinel node biopsy.  The operative event was uneventful.  Pathology is pending at this time.  She was observed overnight and did very well.  She tolerated diet.  Had excellent plane pain control.  Was ambulatory and able to void.  She was instructed in wound and drain care.  Diet and activities were discussed.  She was stable and ready for discharge on postop day 1.  She was given a prescription for Percocet.  She has an appointment to see me in the office on September 20.  She knows we will call the pathology report to her.  Left shoulder range of motion exercises were  discussed.  Consults: None  Significant Diagnostic Studies: Surgical pathology, pending  Treatments: surgery: Left total mastectomy, left axillary sentinel node biopsy  Disposition: Home  Patient Instructions:    Medication List    TAKE these medications        atorvastatin 40 MG tablet  Commonly known as:  LIPITOR  TAKE 1 TABLET EVERY NIGHT AT BEDTIME     dicyclomine 20 MG tablet  Commonly known as:  BENTYL  TAKE 1 TABLET BY MOUTH EVERY 6 HOURS     ibuprofen 200 MG tablet  Commonly known as:  ADVIL,MOTRIN  Take 400 mg by mouth every 6 (six) hours as needed for mild pain or moderate pain.     lisinopril 20 MG tablet  Commonly known as:  PRINIVIL,ZESTRIL  Take 20 mg by mouth daily.     oxyCODONE-acetaminophen 7.5-325 MG per tablet  Commonly known as:  PERCOCET  Take 1 tablet by mouth every 4 (four) hours as needed for severe pain.     pantoprazole 40 MG tablet  Commonly known as:  PROTONIX  Take 40 mg by mouth daily.     potassium chloride SA 20 MEQ tablet  Commonly known as:  K-DUR,KLOR-CON  TAKE 1 TABLET DAILY     Vitamin D 2000 UNITS Caps  Take 2,000 Units by mouth daily.        Activity: No driving for 2 weeks.  No sports or strenuous activity for one month. Diet: low fat, low cholesterol diet Wound Care: as directed  Follow-up:  With Dr. Dalbert Batman on September 20, as scheduled .  Signed: Edsel Petrin. Dalbert Batman, M.D., FACS General and minimally invasive surgery Breast and Colorectal Surgery  06/12/2015, 4:30 AM

## 2015-06-26 ENCOUNTER — Telehealth: Payer: Self-pay | Admitting: Hematology

## 2015-06-26 ENCOUNTER — Ambulatory Visit (HOSPITAL_BASED_OUTPATIENT_CLINIC_OR_DEPARTMENT_OTHER): Payer: BLUE CROSS/BLUE SHIELD | Admitting: Hematology

## 2015-06-26 ENCOUNTER — Encounter: Payer: Self-pay | Admitting: Hematology

## 2015-06-26 VITALS — BP 124/87 | HR 75 | Temp 98.1°F | Resp 16 | Ht 64.0 in | Wt 159.9 lb

## 2015-06-26 DIAGNOSIS — C50212 Malignant neoplasm of upper-inner quadrant of left female breast: Secondary | ICD-10-CM

## 2015-06-26 MED ORDER — OXYCODONE-ACETAMINOPHEN 7.5-325 MG PO TABS: 1.0000 | ORAL_TABLET | ORAL | Status: DC | PRN

## 2015-06-26 NOTE — Telephone Encounter (Signed)
Pt confirmed labs/ov per 09/22 POF, gave pt AVS and Calendar... KJ °

## 2015-06-26 NOTE — Progress Notes (Signed)
[place]  [phone] [phone]  Clinic follow-up Note   Patient Care Team: Midge Minium, MD as PCP - General (Family Medicine) Fanny Skates, MD as Consulting Physician (General Surgery) Truitt Merle, MD as Consulting Physician (Hematology) Thea Silversmith, MD as Consulting Physician (Radiation Oncology) Rockwell Germany, RN as Registered Nurse Mauro Kaufmann, RN as Registered Nurse Holley Bouche, NP as Nurse Practitioner (Nurse Practitioner)   CHIEF COMPLAINTS:  Follow up breast cancer  Oncology History   Breast cancer of upper-inner quadrant of left female breast   Staging form: Breast, AJCC 7th Edition     Clinical stage from 12/25/2014: Stage IIB (T3, N0, M0) - Unsigned       Staging comments: Staged at breast conference on 3.23.16        Breast cancer of upper-inner quadrant of left female breast   12/09/2014 Breast US ultrasound is performed, showing a diffuse, large, irregular, hypoechoic mass with shadowing located within the superior left breast extending from the 9:30 o'clock position to approximately the 2:30 o'clock position and centered at approximately the    12/13/2014 Oncotype testing RS 27, predicted recurrent risk of 18% with tamoxifen alone   12/13/2014 Pathology Results Breast, left, needle core biopsy, 11:30 o'clock - INVASIVE MAMMARY CARCINOMA, SEE COMMENT. - MAMMARY CARCINOMA IN SITU. 2. Breast, left, needle core biopsy, 9:30 o'clock - INVASIVE MAMMARY CARCINOMA, SEE COMMENT   12/13/2014 Receptors her2 Estrogen Receptor: 99%, POSITIVE, STRONG STAINING INTENSITY Progesterone Receptor: 43%, POSITIVE, MODERATE STAINING INTENSITY Proliferation Marker Ki67: 42%  HER-2/NEU BY CISH - NEGATIVE   12/20/2014 Initial Diagnosis Breast cancer of upper-inner quadrant of left female breast   12/23/2014 Breast MRI there is extensive abnormal enhancement throughout much of the left breast suspicious for additional areas of malignancy.  Overall area of this abnormal enhancement covers 8.5 cm x 7 cm x 7.1 cm. Most discrete area of abnormal enhancement lies in the up   01/24/2015 -  Chemotherapy docetaxel 75m/m2, cyclophosphamide 600 mg/m, on day 1 every 21 days.     HISTORY OF PRESENTING ILLNESS:  [name]59y.o. female is here because of newly diagnosed left breast cancer.  This was found by screening mammogram. Her prior screening mammogram was in November 2014 which was negative. Her screening mammogram on 11/25/2014 showed a possible mass and distortion in the left breast. She further underwent diagnostic mammogram and ultrasound on 12/09/2014, which showed a diffuse large irregular hypoechogenic mass extending from 9:30 o'clock position 2-30 o'clock position. Biopsy of this large mass at 2 different positions showed invasive lobular carcinoma.  She denies any symptoms. She feels well overall. She denies any pain, cough, dyspnea, or any GI symptoms. 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 No recent weight loss. No change of her appetite.   She had right breast biopsy was benign. She always has lumpy breast which has not changed per patient. She has IBS, she has dirrhea which is usually triggered by stress, and she takes Imodium as needed.    INTERIM HISTORY: Emily Cookeyreturns for follow-up.    MEDICAL HISTORY:  Past Medical History  Diagnosis Date  . Chicken pox   . Hypertension   . Hyperlipidemia   . IBS (irritable bowel syndrome)   . 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 History of hiatal hernia     SURGICAL HISTORY: Past Surgical History  Procedure Laterality Date  .  Laser ablation of the cervix  1991  . Colonoscopy  06/18/2005  . Mastectomy w/ sentinel node biopsy Left 06/11/2015  . Simple mastectomy with axillary sentinel node biopsy Left 06/11/2015    Procedure: LEFT TOTAL MASTECTOMY WITH LEFT SENTINEL NODE BIOPSY;   Surgeon: Fanny Skates, MD;  Location: [facility];  Service: General;  Laterality: Left;    SOCIAL HISTORY: History   Social History  . Marital Status: Married    Spouse Name: N/A  . Number of Children: 0  . Years of Education: N/A   Occupational History  . 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 Smoking status: Never Smoker   . Smokeless tobacco: Not on file  . Alcohol Use: Yes     Comment: social   . Drug Use: No  . Sexual Activity: Not on file   Other Topics Concern  . Not on file   Social History Narrative   GYN HISTORY  Menarchal: 11 LMP: 03/2013  Contraceptive: no HRT: no  G0P0:    FAMILY HISTORY: Family History  Problem Relation Age of Onset  . Hypertension Mother   . Sudden death Father 30    bleeding   . Cancer Maternal Uncle 60    unknown cancer   . Cancer Paternal Uncle 32    unknown cancer     ALLERGIES:  is allergic to lactose intolerance (gi).  MEDICATIONS:  Current Outpatient Prescriptions  Medication Sig Dispense Refill  . atorvastatin (LIPITOR) 40 MG tablet TAKE 1 TABLET EVERY NIGHT AT BEDTIME 90 tablet 1  . Cholecalciferol (VITAMIN D) 2000 UNITS CAPS Take 2,000 Units by mouth daily.    Marland Kitchen dicyclomine (BENTYL) 20 MG tablet TAKE 1 TABLET BY MOUTH EVERY 6 HOURS (Patient taking differently: TAKE 1 TABLET BY MOUTH EVERY 6 HOURS AS NEEDED FOR IBS) 60 tablet 3  . ibuprofen (ADVIL,MOTRIN) 200 MG tablet Take 400 mg by mouth every 6 (six) hours as needed for mild pain or moderate pain.    Marland Kitchen lisinopril (PRINIVIL,ZESTRIL) 20 MG tablet Take 20 mg by mouth daily.     Marland Kitchen oxyCODONE-acetaminophen (PERCOCET) 7.5-325 MG per tablet Take 1 tablet by mouth every 4 (four) hours as needed for severe pain. 30 tablet 0  . pantoprazole (PROTONIX) 40 MG tablet Take 40 mg by mouth daily.    . potassium chloride SA (K-DUR,KLOR-CON) 20 MEQ tablet TAKE 1 TABLET DAILY 90 tablet 1   No current facility-administered medications for this visit.     REVIEW OF SYSTEMS:   Constitutional: Denies fevers, chills or abnormal night sweats Eyes: Denies blurriness of vision, double vision or watery eyes Ears, nose, mouth, throat, and face: Denies mucositis or sore throat Respiratory: Denies cough, dyspnea or wheezes Cardiovascular: Denies palpitation, chest discomfort or lower extremity swelling Gastrointestinal:  Denies nausea, heartburn or change in bowel habits Skin: Denies abnormal skin rashes Lymphatics: Denies new lymphadenopathy or easy bruising Neurological:Denies numbness, tingling or new weaknesses Behavioral/Psych: Mood is stable, no new changes  All other systems were reviewed with the patient and are negative.  PHYSICAL EXAMINATION: ECOG PERFORMANCE STATUS: 0 - Asymptomatic  Filed Vitals:   06/26/15 1400  BP: 124/87  Pulse: 75  Temp: 98.1 F (36.7 C)  Resp: 16   Filed Weights   06/26/15 1400  Weight: 159 lb 14.4 oz (72.53 kg)    GENERAL:alert, no distress and comfortable SKIN: skin color, texture, turgor are normal, no rashes or significant lesions EYES: normal, conjunctiva are  pink and non-injected, sclera clear OROPHARYNX:no exudate, no erythema and lips, buccal mucosa, and tongue normal  NECK: supple, thyroid normal size, non-tender, without nodularity LYMPH:  no palpable lymphadenopathy in the cervical, axillary or inguinal LUNGS: clear to auscultation and percussion with normal breathing effort HEART: regular rate & rhythm and no murmurs and no lower extremity edema ABDOMEN:abdomen soft, non-tender and normal bowel sounds Musculoskeletal:no cyanosis of digits and no clubbing  PSYCH: alert & oriented x 3 with fluent speech NEURO: no focal motor/sensory deficits Breasts: Breast inspection showed them to be symmetrical with no nipple discharge. There is some fullness in the left upper mid and outer quadrant of left breast, but no discrete mass (initial mass measuring 9X6.5cm),  palpitation of the right breast  and axilla revealed no obvious mass that I could appreciate.   LABORATORY DATA:  I have reviewed the data as listed CBC Latest Ref Rng 06/11/2015 05/30/2015 05/23/2015  WBC 4.0 - 10.5 K/uL 8.8 4.4 14.2(H)  Hemoglobin 12.0 - 15.0 g/dL 10.2(L) 10.6(L) 10.9(L)  Hematocrit 36.0 - 46.0 % 32.7(L) 31.9(L) 33.0(L)  Platelets 150 - 400 K/uL 244 286 286    CMP Latest Ref Rng 06/11/2015 05/30/2015 05/23/2015  Glucose 70 - 140 mg/dl - 97 93  BUN 7.0 - 26.0 mg/dL - 12.4 16  Creatinine 0.44 - 1.00 mg/dL 0.59 0.7 0.68  Sodium 136 - 145 mEq/L - 141 138  Potassium 3.5 - 5.1 mEq/L - 3.9 4.5  Chloride 98 - 110 mmol/L - - 102  CO2 22 - 29 mEq/L - 27 26  Calcium 8.4 - 10.4 mg/dL - 9.4 9.0  Total Protein 6.4 - 8.3 g/dL - 6.3(L) 6.2  Total Bilirubin 0.20 - 1.20 mg/dL - 0.53 0.5  Alkaline Phos 40 - 150 U/L - 60 83  AST 5 - 34 U/L - 19 26  ALT 0 - 55 U/L - 25 27    PATHOLOGY REPORT 12/13/2014 Diagnosis 06/11/2015 1. Breast, simple mastectomy, Left - RESIDUAL SCATTERED TUMOR CELLS CONSISTENT WITH INVASIVE GRADE I LOBULAR CARCINOMA INVOLVING APPROXIMATELY 5.9 CM WORTH OF BREAST PARENCHYMA. - LOBULAR NEOPLASIA (ATYPICAL LOBULAR HYPERPLASIA). - SCATTERED CALCIFICATIONS PRESENT. - MARGINS ARE NEGATIVE. - ONE BENIGN LYMPH NODE WITH NO TUMOR SEEN (0/1). - SEE ONCOLOGY TEMPLATE. 2. Lymph node, sentinel, biopsy, Left axillary #1 - ONE BENIGN LYMPH NODE WITH NO TUMOR SEEN (0/1). 3. Lymph node, sentinel, biopsy, Left axillary #2 - ONE BENIGN LYMPH NODE WITH NO TUMOR SEEN (0/1). 4. Lymph node, sentinel, biopsy, Left axillary #3 - ONE LYMPH NODE POSITIVE FOR METASTATIC LOBULAR CARCINOMA (1/1).  Microscopic Comment 1. BREAST, INVASIVE TUMOR, WITH LYMPH NODES PRESENT Specimen, including laterality and lymph node sampling (sentinel, non-sentinel): Left breast with sentinel lymph node sampling. Procedure: Left mastectomy with left sentinel lymph node biopsies. Histologic type: Invasive lobular carcinoma. Grade:  I. Tubule formation: 3. Nuclear pleomorphism: 1. Mitotic: 1. Tumor size: (based on gross measurement) Scattered tumor cells involve approximately 5.9 cm worth of breast parenchyma. Margins: Invasive, distance to closest margin: At least 0.4 cm (all margins). In-situ, distance to closest margin: At least 0.4 cm (all margin). Lymphovascular invasion: Definitive lymph/vascular invasion is not identified, however tumor is present within a lymph node tissue, see below. Ductal carcinoma in situ: Not identified. Lobular neoplasia: Yes, atypical lobular hyperplasia is present. Tumor focality: Unifocal. Treatment effect: Extensive therapy related changes are present throughout the tumor mass with only scattered residual tumor cells present. The scattered cells involve a 5.9 cm (approximate measurement) area of indurated ill  defined soft tissue. Significant treatment effect is not identified within the lymph nodes. Extent of tumor: Skin: Not involved. Nipple: Tumor involves nipple ducts underlying the surface. Skeletal muscle: Not received. Lymph nodes: Examined: 3 Sentinel. 1 Non-sentinel. 4 Total. Lymph nodes with metastasis: 1. Isolated tumor cells (< 0.2 mm): 0. Micrometastasis: (> 0.2 mm and < 2.0 mm): 0. Macrometastasis: (> 2.0 mm): 1. Extracapsular extension: Not identified. Breast prognostic profile: Performed on previous case [phone] 1) Left 11:30 o'clock biopsy ([id number], part 1). Estrogen receptor: 99% positive. Progesterone receptor: 43% positive. Her 2 neu: 1.84 ratio, negative. Ki-67: 42%. 2) Left 9:30 biopsy ([id number], part 2) Estrogen receptor: 99%, positive. Progesterone receptor: 71%, positive. Her 2 neu: 1.80 ratio, negative. Ki-67: 33%. Additional breast findings: Fibrocystic changes with focal usual ducal hyperplasia; benign ducts with calcifications; stromal calcifications present; neoadjuvant changes. TNM: ypT3, ypN1a. Comments: A  cytokeratin AE1/AE3 immunostain is performed on all lymph node tissue (5 stains total). The staining pattern is compatible with the above findings. Dr. Lyndon Code is in agreement that the tumor is best staged as a ypT3 tumor. As Her-2 neu was previously negative, this will be repeated on a representative slide with tumor and reported in an addendum to follow. (RH;kh 06-12-15)   1. FLUORESCENCE IN-SITU HYBRIDIZATION Results: HER2 - NEGATIVE RATIO OF HER2/CEP17 SIGNALS 1.74 AVERAGE HER2 COPY NUMBER PER CELL 3.30  RADIOGRAPHIC STUDIES: I have personally reviewed the radiological images as listed and agreed with the findings in the report.  Breast MRI 05/17/2059 IMPRESSION: 1. Marked interval decrease in the previously noted extensive enhancement throughout the left breast, compatible with response to treatment. No evidence of malignancy within the right breast. 2. Nonspecific focus of skin enhancement along the medial aspect of the left breast. 3. Incidentally noted hiatal hernia with possible asymmetric wall thickening.   ASSESSMENT & PLAN:  59 yo female, with PMH of  Hypertension and a mild IBS , otherwise very fit and healthy postmenopausal woman , who was found to have left breast cancer by screening mammogram.  1. CT3N0M0,  Stage IIB,  Invasive lobular carcinoma,  Strongly ER and PR positive, HER-2 negative, Ki67 33-42%, and lobular carcinoma in situ, ypT3N1aMx, pathological stage IIIA after neoadjuvant chemo  -She received neoadjuvant 6 cycle of TC, followed by left mastectomy and sentinel lymph node biopsy, surgical margins were negative. -I discussed her surgical pathology findings with patient and her husband in great details. 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 -Her surgical pathology findings were discussed in the tumor board, Dr. Dalbert Batman  recommends against axillary lymph node dissection, and we'll recommend adjuvant radiation. This was discussed with patient and she agrees to proceed. - Giving the strong ER/PR positivity, I would recommend adjuvant endocrine therapy with aromatase inhibitor, likely 10 years. This will start after she completes breast radiation -Giving the pathological stage III disease, I'll obtain a PET scan to ruled out distant metastasis. She is only 2 weeks out of surgery, I'll obtain the PET scan before she starts breast radiation.    -We discussed the surveillance plan after her breast radiation. Patient and her husband had many questions about the surveillance, how to prove cancer free, etc. We had extensive discussion.  2.  Hypertension - continue medication. Follow-up of his primary care physician  3. Alopecia, secondary to chemotherapy -Her hair are slowly growing back  4. Anemia -Secondary to chemotherapy, stable. No  need for blood transfusion.  Plan: -PET in 2 weeks  -she will see Dr Pablo Ledger for radiation  -I will see her back in 2 month when she completes radiation.    Patient and her husband had multiple is she taking long I answered to their satisfaction. The patient knows to call the clinic with any problems, questions or concerns.  I spent 30 minutes counseling the patient face to face. The total time spent in the appointment was 40 minutes and more than 50% was on counseling.     Truitt Merle, MD 06/26/2015   7:43 AM

## 2015-07-01 ENCOUNTER — Ambulatory Visit: Payer: BLUE CROSS/BLUE SHIELD | Attending: General Surgery | Admitting: Physical Therapy

## 2015-07-01 DIAGNOSIS — Z9189 Other specified personal risk factors, not elsewhere classified: Secondary | ICD-10-CM | POA: Insufficient documentation

## 2015-07-01 DIAGNOSIS — R293 Abnormal posture: Secondary | ICD-10-CM

## 2015-07-01 DIAGNOSIS — C50912 Malignant neoplasm of unspecified site of left female breast: Secondary | ICD-10-CM | POA: Diagnosis present

## 2015-07-01 DIAGNOSIS — M25612 Stiffness of left shoulder, not elsewhere classified: Secondary | ICD-10-CM | POA: Insufficient documentation

## 2015-07-01 DIAGNOSIS — M25512 Pain in left shoulder: Secondary | ICD-10-CM | POA: Diagnosis present

## 2015-07-01 NOTE — Therapy (Signed)
[place], [place], [address] Phone: [phone]   Fax:  [phone]  Physical Therapy Evaluation  Patient Details  Name: Emily Griffith MRN: [id number] Date of Birth: [birth year] Referring Provider:  Fanny Skates, MD  Encounter Date: 07/01/2015      PT End of Session - 07/01/15 1816    Visit Number 1   Number of Visits 12   Date for PT Re-Evaluation 08/05/15   PT Start Time 1300   PT Stop Time 1346   PT Time Calculation (min) 46 min   Activity Tolerance Patient tolerated treatment well   Behavior During Therapy [facility] for tasks assessed/performed      Past Medical History  Diagnosis Date  . Chicken pox   . Hypertension   . Hyperlipidemia   . IBS (irritable bowel syndrome)   . 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 History of hiatal hernia     Past Surgical History  Procedure Laterality Date  . Laser ablation of the cervix  1991  . Colonoscopy  06/18/2005  . Mastectomy w/ sentinel node biopsy Left 06/11/2015  . Simple mastectomy with axillary sentinel node biopsy Left 06/11/2015    Procedure: LEFT TOTAL MASTECTOMY WITH LEFT SENTINEL NODE BIOPSY;  Surgeon: Fanny Skates, MD;  Location: [place];  Service: General;  Laterality: Left;    There were no vitals filed for this visit.  Visit Diagnosis:  Abnormal posture - Plan: PT plan of care cert/re-cert  Shoulder stiffness, left - Plan: PT plan of care cert/re-cert  Left shoulder pain - Plan: PT plan of care cert/re-cert  At risk for lymphedema - Plan: PT plan of care cert/re-cert      Subjective Assessment - 07/01/15 1310    Subjective It hurt more than I thought it would    Pertinent History 6 total chemotherapy cycles that ended May 09, 2015, no problems with neuropathy. Plans to have radiatoin with consult on Jul 10, 2015    Currently in Pain? Yes   Pain Score 4    Pain Location Chest   Pain  Orientation Left   Pain Descriptors / Indicators Cramping   Pain Radiating Towards toward under under atm and back of pain    Pain Onset 1 to 4 weeks ago   Aggravating Factors  activity, including moving head or moving arm    Pain Relieving Factors supporting arm on pillow    Effect of Pain on Daily Activities can't life anything heavy, using right arm more than left             [facility] PT Assessment - 07/01/15 0001    Assessment   Medical Diagnosis Left breast cancer   Onset Date/Surgical Date 12/18/14   Precautions   Precautions Other (comment)  Active breast cancer   Restrictions   Weight Bearing Restrictions No   Balance Screen   Has the patient fallen in the past 6 months No   Has the patient had a decrease in activity level because of a fear of falling?  No   Is the patient reluctant to leave their home because of a fear of falling?  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 visible fullness in anterior chest toward axilla    Sensation   Light Touch Appears Intact   Coordination   Gross Motor Movements are Fluid and Coordinated --  pt very protetive of left shoulder movement   Posture/Postural Control   Posture/Postural Control Postural limitations   Postural Limitations Rounded Shoulders;Forward head   AROM   Right Shoulder Extension --   Right Shoulder Flexion --   Right Shoulder ABduction  --   Right Shoulder Internal Rotation --   Right Shoulder External Rotation --   Left Shoulder Extension --   Left Shoulder Flexion 108 Degrees   Left Shoulder ABduction 85 Degrees   Left Shoulder Internal Rotation --   Left Shoulder External Rotation --   Strength   Overall Strength --   Overall Strength Comments limited by pain and pt reluctance to move    Left Shoulder Flexion 3-/5   Left Shoulder ABduction 3-/5   Left Shoulder Internal Rotation 3-/5   Left Shoulder External Rotation 3-/5   Palpation   Palpation comment pt very tender to touch especially in left axilla and upper arm  possible cording in axilla, but pt could not allow full evaluation            LYMPHEDEMA/ONCOLOGY QUESTIONNAIRE - 07/01/15 1334    Left Upper Extremity Lymphedema   10 cm Proximal to Olecranon Process 29 cm   Olecranon Process 23 cm   10 cm Proximal to Ulnar Styloid Process 19.9 cm   Just Proximal to Ulnar Styloid Process 14 cm   Across Hand at [employer] 17.3 cm   At [place] of 2nd Digit 5.8 cm           Quick Dash - 07/01/15 0001    Open a tight or new jar Unable   Do heavy household chores (wash walls, wash floors) Unable   Carry a shopping bag or briefcase Unable   Wash your back Unable   Use a knife to cut food Mild difficulty   Recreational activities in which you take some force or impact through your arm, shoulder, or hand (golf, hammering, tennis) Unable   During the past week, to what extent has your arm, shoulder or hand problem interfered with your normal social activities with family, friends, neighbors, or groups? Quite a bit   During the past week, to what extent has your arm, shoulder or hand problem limited your work or other regular daily activities [place]   Arm, shoulder, or hand pain. Severe   Tingling (pins and needles) in your arm, shoulder, or hand None   Difficulty Sleeping Moderate difficulty   DASH Score 70.45 %             [facility] Adult PT  Treatment/Exercise - 07/01/15 0001    Shoulder Exercises: Supine   Other Supine Exercises dowel shoulder flexion   Other Supine Exercises elbow supported and flexied so shoulder in senmiscaption for active external and internal rotation. Pt felt some relief with this                 PT Education - 07/01/15 1815    Education provided Yes   Education Details supine dowel rod  flexion and internal and external rotation   Person(s) Educated Patient   Methods Explanation;Demonstration;Handout   Comprehension Verbalized understanding;Returned demonstration;Need further instruction              Breast Clinic Goals - 12/25/14 1325    Patient will be able to verbalize understanding of pertinent lymphedema risk reduction practices relevant to her diagnosis specifically related to skin care.   Time 1   Period Days   Status Achieved   Patient will be able to return demonstrate and/or verbalize understanding of the post-op home exercise program related to regaining shoulder range of motion.   Time 1   Period Days   Status Achieved   Patient will be able to verbalize understanding of the importance of attending the postoperative After Breast Cancer Class for further lymphedema risk reduction education and therapeutic exercise.   Time 1   Period Days   Status Achieved          Long Term Clinic Goals - 07/01/15 1823    CC Long Term Goal  #1   Title Patient with verbalize an understanding of lymphedema risk reduction precautions (target 08/05/2015)   Time 4   Period Weeks   Status New   CC Long Term Goal  #2   Title Patient will improve left shoulder abduction to 120 degrees so that she can achieve position needed for radiation therapy.(target 08/05/2015)   Time 4   Period Weeks   Status New   CC Long Term Goal  #3   Title Patient will decrease the DASH score to < 30   to demonstrate increased functional use of upper extremity(target 08/05/2015)   Baseline 70.45   Time 4    Period Weeks   Status New   CC Long Term Goal  #4   Title Patient will be independent in a home exercise program (target 08/05/2015)   Time 4   Period Weeks   Status New            Plan - 07/01/15 1817    Clinical Impression Statement Pt comes in today with pain, limited range of motion and possible axillary cording  with some edema post left mastectomy . She has limited active movement due to pain  She needs to increase her range of motion so that she can receive radiation therapy.    Pt will benefit from skilled therapeutic intervention in order to improve on the following deficits Decreased range of motion;Increased edema;Impaired UE functional use;Pain;Decreased strength;Decreased knowledge of precautions;Decreased knowledge of use of DME;Impaired perceived functional ability;Postural dysfunction   Rehab Potential Excellent   Clinical Impairments Affecting Rehab Potential pain and  guarding with active movement    PT Frequency 3x / week  may only be able to come 2x per week    PT Treatment/Interventions Patient/family education;Therapeutic exercise;ADLs/Self Care Home Management;Manual lymph drainage;Neuromuscular re-education;Passive range of motion;DME Instruction;Manual techniques   PT Next Visit Plan  brieft manual lymph draiange to anterior chest and axilla, be careful with cording.  encourage all active motion of shouder, consider UE ranger. consider tg soft for lymphatic support??   Consulted and Agree with Plan of Care Patient         Problem List Patient Active Problem List   Diagnosis Date Noted  . Cancer of central portion of left female breast 06/11/2015  . Hiatal hernia 05/23/2015  . 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 Cervical cancer screening 11/15/2014  . Severe sprain of  left ankle 02/11/2014  . Routine general medical examination at a health care facility 09/12/2013  . HTN (hypertension) 03/27/2013  . Hyperlipidemia  03/27/2013  . IBS (irritable bowel syndrome) 03/27/2013      Donato Heinz. Owens Shark, PT  07/01/2015, 6:29 PM  [place], [place], [address] Phone: [phone]   Fax:  [phone]

## 2015-07-01 NOTE — Patient Instructions (Signed)
Cane Overhead - Supine  Hold cane at thighs with both hands, extend arms straight over head. Hold _1_ seconds. Repeat _5__ times. Do _3__ times per day.   Copyright  VHI. All rights reserved.

## 2015-07-02 ENCOUNTER — Ambulatory Visit: Payer: BLUE CROSS/BLUE SHIELD

## 2015-07-02 DIAGNOSIS — M25612 Stiffness of left shoulder, not elsewhere classified: Secondary | ICD-10-CM

## 2015-07-02 DIAGNOSIS — R293 Abnormal posture: Secondary | ICD-10-CM | POA: Diagnosis not present

## 2015-07-02 DIAGNOSIS — C50912 Malignant neoplasm of unspecified site of left female breast: Secondary | ICD-10-CM

## 2015-07-02 DIAGNOSIS — Z9189 Other specified personal risk factors, not elsewhere classified: Secondary | ICD-10-CM

## 2015-07-02 DIAGNOSIS — M25512 Pain in left shoulder: Secondary | ICD-10-CM

## 2015-07-02 NOTE — Therapy (Signed)
[place], [place], [address] Phone: [phone]   Fax:  [phone]  Physical Therapy Treatment  Patient Details  Name: Emily Griffith MRN: [id number] Date of Birth: 06-Mar-1956 Referring Provider:  Midge Minium, MD  Encounter Date: 07/02/2015      PT End of Session - 07/02/15 1609    Visit Number 2   Number of Visits 12   Date for PT Re-Evaluation 08/05/15   PT Start Time 3532   PT Stop Time 1608   PT Time Calculation (min) 45 min   Activity Tolerance Patient tolerated treatment well   Behavior During Therapy [facility] for tasks assessed/performed      Past Medical History  Diagnosis Date  . Chicken pox   . Hypertension   . Hyperlipidemia   . IBS (irritable bowel syndrome)   . 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 History of hiatal hernia     Past Surgical History  Procedure Laterality Date  . Laser ablation of the cervix  1991  . Colonoscopy  06/18/2005  . Mastectomy w/ sentinel node biopsy Left 06/11/2015  . Simple mastectomy with axillary sentinel node biopsy Left 06/11/2015    Procedure: LEFT TOTAL MASTECTOMY WITH LEFT SENTINEL NODE BIOPSY;  Surgeon: Fanny Skates, MD;  Location: [place];  Service: General;  Laterality: Left;    There were no vitals filed for this visit.  Visit Diagnosis:  Abnormal posture  Shoulder stiffness, left  Left shoulder pain  At risk for lymphedema  Left breast cancer with T3 tumor, >5 cm in greatest dimension      Subjective Assessment - 07/01/15 1310    Subjective It hurt more than I thought it would    Pertinent History 6 total chemotherapy cycles that ended May 09, 2015, no problems with neuropathy. Plans to have radiatoin with consult on Jul 10, 2015    Currently in Pain? Yes   Pain Score 4    Pain Location Chest   Pain Orientation Left   Pain Descriptors / Indicators Cramping   Pain Radiating Towards  toward under under atm and back of pain    Pain Onset 1 to 4 weeks ago   Aggravating Factors  activity, including moving head or moving arm    Pain Relieving Factors supporting arm on pillow    Effect of Pain on Daily Activities can't life anything heavy, using right arm more than left                LYMPHEDEMA/ONCOLOGY QUESTIONNAIRE - 07/01/15 1334    Left Upper Extremity Lymphedema   10 cm Proximal to Olecranon Process 29 cm   Olecranon Process 23 cm   10 cm Proximal to Ulnar Styloid Process 19.9 cm   Just Proximal to Ulnar Styloid Process 14 cm   Across Hand at [employer] 17.3 cm   At [place] of 2nd Digit 5.8 cm                  [facility] Adult PT Treatment/Exercise - 07/02/15 0001    Manual Therapy   Myofascial Release To Lt chest wall horizontal and vertically to pts tolerance   Manual Lymphatic Drainage (MLD) In Supine: Short neck, Rt axilla and upper quadrant and Lt inguinal nodes, anterior inter-axillary and Lt axillo-inguinal anastomosis and focused on Rt anterior chest   Passive ROM To Lt shoulder very gently and slowly to pts tolerance into flexion, horizontal abduction,  and ER/IR                PT Education - 07/01/15 1815    Education provided Yes   Education Details supine dowel rod flexion and internal and external rotation   Person(s) Educated Patient   Methods Explanation;Demonstration;Handout   Comprehension Verbalized understanding;Returned demonstration;Need further instruction                [facility] Goals - 07/01/15 1823    CC Long Term Goal  #1   Title Patient with verbalize an understanding of lymphedema risk reduction precautions (target 08/05/2015)   Time 4   Period Weeks   Status New   CC Long Term Goal  #2   Title Patient will improve left shoulder abduction to 120 degrees so that she can achieve position needed for radiation therapy.(target 08/05/2015)   Time 4   Period Weeks   Status New   CC Long Term  Goal  #3   Title Patient will decrease the DASH score to < 30   to demonstrate increased functional use of upper extremity(target 08/05/2015)   Baseline 70.45   Time 4   Period Weeks   Status New   CC Long Term Goal  #4   Title Patient will be independent in a home exercise program (target 08/05/2015)   Time 4   Period Weeks   Status New            Plan - 07/02/15 1610    Clinical Impression Statement Pt, though she had to work on it, did well with trying to relax throughout treatment and tolerating stretching. Did have one cramp in pectoralis during stretching that eased off with cuing to relax. Pt reported feeling good after session and able to get into her jacket and shirt easier.    Pt will benefit from skilled therapeutic intervention in order to improve on the following deficits Decreased range of motion;Increased edema;Impaired UE functional use;Pain;Decreased strength;Decreased knowledge of precautions;Decreased knowledge of use of DME;Impaired perceived functional ability;Postural dysfunction   Rehab Potential Excellent   Clinical Impairments Affecting Rehab Potential pain and  guarding with active movement    PT Treatment/Interventions Patient/family education;Therapeutic exercise;ADLs/Self Care Home Management;Manual lymph drainage;Neuromuscular re-education;Passive range of motion;DME Instruction;Manual techniques   PT Next Visit Plan  brieft manual lymph draiange to anterior chest and axilla, be careful with cording.  encourage all active motion of shouder, consider UE ranger. consider tg soft for lymphatic support??   Consulted and Agree with Plan of Care Patient        Problem List Patient Active Problem List   Diagnosis Date Noted  . Cancer of central portion of left female breast 06/11/2015  . Hiatal hernia 05/23/2015  . 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 Cervical cancer screening 11/15/2014  . Severe sprain of left ankle  02/11/2014  . Routine general medical examination at a health care facility 09/12/2013  . HTN (hypertension) 03/27/2013  . Hyperlipidemia 03/27/2013  . IBS (irritable bowel syndrome) 03/27/2013    Otelia Limes, PTA 07/02/2015, 4:12 PM  [place], [place], [address] Phone: [phone]   Fax:  [phone]

## 2015-07-07 ENCOUNTER — Telehealth: Payer: Self-pay | Admitting: *Deleted

## 2015-07-07 NOTE — Telephone Encounter (Signed)
i called her back and left a message on her cell phone. I ordered a baseline PET scan based on her pathological breast cancer staging (stage III), it can be done before or after radiation, but I prefer before radiation to rule out metastasis. I encouraged her to call me back if she has further questions.  Truitt Merle 07/07/2015

## 2015-07-10 ENCOUNTER — Ambulatory Visit: Payer: BLUE CROSS/BLUE SHIELD | Admitting: Radiation Oncology

## 2015-07-10 ENCOUNTER — Ambulatory Visit: Payer: BLUE CROSS/BLUE SHIELD

## 2015-07-11 ENCOUNTER — Encounter (HOSPITAL_COMMUNITY)
Admission: RE | Admit: 2015-07-11 | Discharge: 2015-07-11 | Disposition: A | Discharge status: Home / Self Care | Payer: BLUE CROSS/BLUE SHIELD | Source: Ambulatory Visit | Attending: Hematology | Admitting: Hematology

## 2015-07-11 DIAGNOSIS — C50212 Malignant neoplasm of upper-inner quadrant of left female breast: Secondary | ICD-10-CM

## 2015-07-11 LAB — GLUCOSE, CAPILLARY: GLUCOSE-CAPILLARY: 96 mg/dL (ref 65–99)

## 2015-07-11 MED ORDER — FLUDEOXYGLUCOSE F - 18 (FDG) INJECTION: 7.9000 | Freq: Once | INTRAVENOUS | Status: DC | PRN

## 2015-07-14 ENCOUNTER — Encounter: Payer: Self-pay | Admitting: Physical Therapy

## 2015-07-14 ENCOUNTER — Ambulatory Visit: Payer: BLUE CROSS/BLUE SHIELD | Attending: General Surgery | Admitting: Physical Therapy

## 2015-07-14 DIAGNOSIS — M25612 Stiffness of left shoulder, not elsewhere classified: Secondary | ICD-10-CM | POA: Diagnosis present

## 2015-07-14 DIAGNOSIS — Z9189 Other specified personal risk factors, not elsewhere classified: Secondary | ICD-10-CM

## 2015-07-14 DIAGNOSIS — C50912 Malignant neoplasm of unspecified site of left female breast: Secondary | ICD-10-CM | POA: Diagnosis present

## 2015-07-14 DIAGNOSIS — R293 Abnormal posture: Secondary | ICD-10-CM

## 2015-07-14 DIAGNOSIS — M25512 Pain in left shoulder: Secondary | ICD-10-CM | POA: Diagnosis present

## 2015-07-14 NOTE — Therapy (Signed)
[place], [place], [address] Phone: [phone]   Fax:  [phone]  Physical Therapy Treatment  Patient Details  Name: Emily Griffith MRN: [id number] Date of Birth: [birth year] Referring Provider:  Fanny Skates, MD  Encounter Date: 07/14/2015      PT End of Session - 07/14/15 1519    PT Start Time 6440   PT Stop Time 1519   PT Time Calculation (min) 47 min      Past Medical History  Diagnosis Date  . Chicken pox   . Hypertension   . Hyperlipidemia   . IBS (irritable bowel syndrome)   . Breast cancer of upper-inner quadrant of left female breast ([place]) 12/20/2014  . History of hiatal hernia     Past Surgical History  Procedure Laterality Date  . Laser ablation of the cervix  1991  . Colonoscopy  06/18/2005  . Mastectomy w/ sentinel node biopsy Left 06/11/2015  . Simple mastectomy with axillary sentinel node biopsy Left 06/11/2015    Procedure: LEFT TOTAL MASTECTOMY WITH LEFT SENTINEL NODE BIOPSY;  Surgeon: Fanny Skates, MD;  Location: [place];  Service: General;  Laterality: Left;    There were no vitals filed for this visit.  Visit Diagnosis:  Abnormal posture  Shoulder stiffness, left  Left shoulder pain  At risk for lymphedema  Left breast cancer with T3 tumor, >5 cm in greatest dimension [facility])      Subjective Assessment - 07/14/15 1438    Subjective Doing alot better since I've seen you.  Still have chest swelling but it's better.   Pertinent History 6 total chemotherapy cycles that ended May 09, 2015, no problems with neuropathy. Plans to have radiatoin with consult on Jul 10, 2015    Patient Stated Goals reduce swelling and move arm normally to get positioned for radiation   Currently in Pain? Yes   Pain Score 2    Pain Location Chest   Pain Orientation Left   Pain Descriptors / Indicators Throbbing   Pain Type Surgical pain   Pain Onset 1 to 4 weeks ago             [facility] PT Assessment - 07/14/15 0001    AROM   Left Shoulder Flexion 111 Degrees   Left Shoulder ABduction 121 Degrees                     [facility] Adult PT Treatment/Exercise - 07/14/15 0001    Shoulder Exercises: Standing   Other Standing Exercises Finger ladder x5 flexion and x5 abduction   Shoulder Exercises: Pulleys   Flexion 2 minutes   Flexion Limitations To pt tolerance after PT demo   ABduction 2 minutes   ABduction Limitations After PT demo   Shoulder Exercises: Therapy Ball   Flexion 10 reps   ABduction 10 reps   ABduction Limitations Left arm only after PT demo   Manual Therapy   Manual Lymphatic Drainage (MLD) In Supine: Short neck, Rt axilla and upper quadrant and Lt inguinal nodes, anterior inter-axillary and Lt axillo-inguinal anastomosis and focused on Rt anterior chest   Passive ROM To Lt shoulder very gently and slowly to pts tolerance into flexion, horizontal abduction, and ER/IR with focus on radiation positioning; also neural stretching to pt tolerance for cording of Lt UE                PT Education - 07/14/15 1503    Education provided Yes  Education Details Reviewed exercises given pre-operatively with PT demo and pt return demo   Person(s) Educated Patient   Methods Explanation;Demonstration;Handout   Comprehension Verbalized understanding;Returned demonstration                [facility] Goals - 07/01/15 1823    CC Long Term Goal  #1   Title Patient with verbalize an understanding of lymphedema risk reduction precautions (target 08/05/2015)   Time 4   Period Weeks   Status New   CC Long Term Goal  #2   Title Patient will improve left shoulder abduction to 120 degrees so that she can achieve position needed for radiation therapy.(target 08/05/2015)   Time 4   Period Weeks   Status New   CC Long Term Goal  #3   Title Patient will decrease the DASH score to < 30   to demonstrate increased functional use  of upper extremity(target 08/05/2015)   Baseline 70.45   Time 4   Period Weeks   Status New   CC Long Term Goal  #4   Title Patient will be independent in a home exercise program (target 08/05/2015)   Time 4   Period Weeks   Status New            Plan - 07/14/15 1511    Clinical Impression Statement Pt appears to be doing much better than 2 weeks ago and is pleased with her progress from doing her HEP.  She is still lacking enough ROM for radiation positioning but should continue to improve with PT.  Swelling on left chest appears to be post-surgical swelling and should continue to improve with time and gentle manual lymph drainage.   Pt will benefit from skilled therapeutic intervention in order to improve on the following deficits Decreased range of motion;Increased edema;Impaired UE functional use;Pain;Decreased strength;Decreased knowledge of precautions;Decreased knowledge of use of DME;Impaired perceived functional ability;Postural dysfunction   Rehab Potential Excellent   Clinical Impairments Affecting Rehab Potential pain and  guarding with active movement    PT Frequency 3x / week   PT Treatment/Interventions Patient/family education;Therapeutic exercise;ADLs/Self Care Home Management;Manual lymph drainage;Neuromuscular re-education;Passive range of motion;DME Instruction;Manual techniques   PT Next Visit Plan Continue ROM exercises for helping her achieve radiation positioning.   Consulted and Agree with Plan of Care Patient        Problem List Patient Active Problem List   Diagnosis Date Noted  . Cancer of central portion of left female breast ([place]) 06/11/2015  . Hiatal hernia 05/23/2015  . Breast cancer of upper-inner quadrant of left female breast ([place]) 12/20/2014  . Cervical cancer screening 11/15/2014  . Severe sprain of left ankle 02/11/2014  . Routine general medical examination at a health care facility 09/12/2013  . HTN (hypertension) 03/27/2013  .  Hyperlipidemia 03/27/2013  . IBS (irritable bowel syndrome) 03/27/2013    Annia Friendly, PT 07/14/2015 3:20 PM   [place], [place], [address] Phone: [phone]   Fax:  [phone]

## 2015-07-15 ENCOUNTER — Other Ambulatory Visit: Payer: Self-pay | Admitting: Hematology

## 2015-07-15 DIAGNOSIS — C50212 Malignant neoplasm of upper-inner quadrant of left female breast: Secondary | ICD-10-CM

## 2015-07-16 ENCOUNTER — Encounter: Payer: Self-pay | Admitting: Hematology

## 2015-07-16 ENCOUNTER — Encounter: Payer: Self-pay | Admitting: Family Medicine

## 2015-07-17 ENCOUNTER — Encounter: Payer: BLUE CROSS/BLUE SHIELD | Admitting: Physical Therapy

## 2015-07-17 ENCOUNTER — Ambulatory Visit (HOSPITAL_COMMUNITY)
Admission: RE | Admit: 2015-07-17 | Discharge: 2015-07-17 | Disposition: A | Discharge status: Home / Self Care | Payer: BLUE CROSS/BLUE SHIELD | Source: Ambulatory Visit | Attending: Hematology | Admitting: Hematology

## 2015-07-17 ENCOUNTER — Telehealth: Payer: Self-pay | Admitting: Hematology

## 2015-07-17 ENCOUNTER — Ambulatory Visit: Payer: BLUE CROSS/BLUE SHIELD | Admitting: Physical Therapy

## 2015-07-17 DIAGNOSIS — C50212 Malignant neoplasm of upper-inner quadrant of left female breast: Secondary | ICD-10-CM | POA: Insufficient documentation

## 2015-07-17 DIAGNOSIS — Z9189 Other specified personal risk factors, not elsewhere classified: Secondary | ICD-10-CM

## 2015-07-17 DIAGNOSIS — K449 Diaphragmatic hernia without obstruction or gangrene: Secondary | ICD-10-CM | POA: Insufficient documentation

## 2015-07-17 DIAGNOSIS — M25612 Stiffness of left shoulder, not elsewhere classified: Secondary | ICD-10-CM

## 2015-07-17 DIAGNOSIS — R293 Abnormal posture: Secondary | ICD-10-CM

## 2015-07-17 DIAGNOSIS — K7689 Other specified diseases of liver: Secondary | ICD-10-CM | POA: Diagnosis not present

## 2015-07-17 DIAGNOSIS — M25512 Pain in left shoulder: Secondary | ICD-10-CM

## 2015-07-17 LAB — CREATININE, SERUM
Creatinine, Ser: 0.59 mg/dL (ref 0.44–1.00)
GFR calc Af Amer: >60 mL/min (ref >60)
GFR calc non Af Amer: >60 mL/min (ref >60)

## 2015-07-17 MED ORDER — GADOBENATE DIMEGLUMINE 529 MG/ML IV SOLN
15.0000 mL | Freq: Once | INTRAVENOUS | Status: AC | PRN
  Administered 2015-07-17: 15 mL via INTRAVENOUS

## 2015-07-17 NOTE — Telephone Encounter (Signed)
I called pt and discussed her PET and MRI results. No evidence of metastatic disease. She was quite relieved and appreciated the call.  Emily Griffith  07/17/2015

## 2015-07-17 NOTE — Therapy (Signed)
Emily Griffith, [place], [address] Phone: [phone]   Fax:  [phone]  Physical Therapy Treatment  Patient Details  Name: Emily Griffith MRN: [id number] Date of Birth: September 28, 1956 Referring Provider:  Fanny Skates, MD  Encounter Date: 07/17/2015      PT End of Session - 07/17/15 1310    Visit Number 4   Number of Visits 12   Date for PT Re-Evaluation 08/05/15   PT Start Time 0930   PT Stop Time 1015   PT Time Calculation (min) 45 min   Activity Tolerance Patient tolerated treatment well   Behavior During Therapy [facility] for tasks assessed/performed      Past Medical History  Diagnosis Date  . Chicken pox   . Hypertension   . Hyperlipidemia   . IBS (irritable bowel syndrome)   . Breast cancer of upper-inner quadrant of left female breast ([place]) 12/20/2014  . History of hiatal hernia     Past Surgical History  Procedure Laterality Date  . Laser ablation of the cervix  1991  . Colonoscopy  06/18/2005  . Mastectomy w/ sentinel node biopsy Left 06/11/2015  . Simple mastectomy with axillary sentinel node biopsy Left 06/11/2015    Procedure: LEFT TOTAL MASTECTOMY WITH LEFT SENTINEL NODE BIOPSY;  Surgeon: Emily Skates, MD;  Location: [place];  Service: General;  Laterality: Left;    There were no vitals filed for this visit.  Visit Diagnosis:  Abnormal posture  Shoulder stiffness, left  Left shoulder pain  At risk for lymphedema      Subjective Assessment - 07/17/15 0935    Subjective "I'm doing better"  She goes back to radiation doctor on 07/24/2015.  She still feels a little puffy along left side under arm, but is not as tender as she used to be   Pertinent History 6 total chemotherapy cycles that ended May 09, 2015, no problems with neuropathy. Plans to have radiatoin with consult on Jul 10, 2015    Patient Stated Goals reduce swelling and move arm normally to get positioned for  radiation   Currently in Pain? Yes   Pain Score 2    Pain Location Chest   Pain Orientation Left;Lateral                         [facility] Adult PT Treatment/Exercise - 07/17/15 0001    Neck Exercises: Seated   Other Seated Exercise neck range of motion with deep breathing   Shoulder Exercises: Supine   Protraction AROM;Left;10 reps   Flexion AROM;Left;10 reps   Other Supine Exercises hand to ceiling with small circles in each direction.    Other Supine Exercises elbow to ceiling, tricep extensions   Shoulder Exercises: Sidelying   External Rotation AROM;Left;10 reps   ABduction AROM;Left;10 reps   ABduction Limitations encouraged pt to stretch arm overhead as far as possible.   Other Sidelying Exercises with hand toward ceiling, small circles in each dirction    Shoulder Exercises: Standing   Protraction AROM;Both;5 reps   Other Standing Exercises wall washing    Other Standing Exercises finger ladder to tolerance    Shoulder Exercises: Pulleys   Flexion 2 minutes   ABduction 2 minutes   Shoulder Exercises: Therapy Ball   Flexion 10 reps   Shoulder Exercises: ROM/Strengthening   Other ROM/Strengthening Exercises supine spinal realignment exercise    Manual Therapy   Manual Lymphatic Drainage (MLD) in supine, brief session  of manual lymph drainage to anterior chest, in sidelying, manual lymph drainage to lateral chest and back with posterior interaxillary anastamosis   Passive ROM To Lt shoulder very gently and slowly to pts tolerance                 PT Education - 07/17/15 1310    Education provided Yes   Education Details supine Meeks' spinal realignment exercise    Person(s) Educated Patient   Methods Explanation;Demonstration;Handout   Comprehension Returned demonstration;Verbalized understanding                [facility] Goals - 07/01/15 1823    CC Long Term Goal  #1   Title Patient with verbalize an understanding of lymphedema  risk reduction precautions (target 08/05/2015)   Time 4   Period Weeks   Status New   CC Long Term Goal  #2   Title Patient will improve left shoulder abduction to 120 degrees so that she can achieve position needed for radiation therapy.(target 08/05/2015)   Time 4   Period Weeks   Status New   CC Long Term Goal  #3   Title Patient will decrease the DASH score to < 30   to demonstrate increased functional use of upper extremity(target 08/05/2015)   Baseline 70.45   Time 4   Period Weeks   Status New   CC Long Term Goal  #4   Title Patient will be independent in a home exercise program (target 08/05/2015)   Time 4   Period Weeks   Status New            Plan - 07/17/15 1311    Clinical Impression Statement pt is making slow and steady gains, but still needs encouragement for active exercise and indicates pain with stretching. Encouraged frequent active UE and scapular movement .   Pt will benefit from skilled therapeutic intervention in order to improve on the following deficits Decreased range of motion;Increased edema;Impaired UE functional use;Pain;Decreased strength;Decreased knowledge of precautions;Decreased knowledge of use of DME;Impaired perceived functional ability;Postural dysfunction   Clinical Impairments Affecting Rehab Potential pain and  guarding with active movement    PT Next Visit Plan Remeasure left shoulder ROM Continue ROM exercises and manual techniques  for helping her achieve radiation positioning.   Consulted and Agree with Plan of Care Patient        Problem List Patient Active Problem List   Diagnosis Date Noted  . Cancer of central portion of left female breast ([place]) 06/11/2015  . Hiatal hernia 05/23/2015  . Breast cancer of upper-inner quadrant of left female breast ([place]) 12/20/2014  . Cervical cancer screening 11/15/2014  . Severe sprain of left ankle 02/11/2014  . Routine general medical examination at a health care facility 09/12/2013  . HTN  (hypertension) 03/27/2013  . Hyperlipidemia 03/27/2013  . IBS (irritable bowel syndrome) 03/27/2013   Donato Heinz. Owens Shark, PT 07/17/2015, 1:17 PM  [place], [place], [address] Phone: [phone]   Fax:  [phone]

## 2015-07-21 ENCOUNTER — Ambulatory Visit: Payer: BLUE CROSS/BLUE SHIELD | Admitting: Physical Therapy

## 2015-07-21 ENCOUNTER — Encounter: Payer: Self-pay | Admitting: Family Medicine

## 2015-07-21 DIAGNOSIS — M25512 Pain in left shoulder: Secondary | ICD-10-CM

## 2015-07-21 DIAGNOSIS — M25612 Stiffness of left shoulder, not elsewhere classified: Secondary | ICD-10-CM

## 2015-07-21 DIAGNOSIS — R293 Abnormal posture: Secondary | ICD-10-CM | POA: Diagnosis not present

## 2015-07-21 NOTE — Patient Instructions (Signed)
External Rotator Cuff Stretch, Standing    Stand, palm against door frame and elbow bent at 90. Turn body away from fixed hand allowing shoulder to come forward. Hold _15__ seconds.  Repeat _3__ times per session. Do __2_ sessions per day.  Or do this when a cramp hits to try to relieve it.  You can vary the height of your hand on the doorjamb to get the stretch in just the right spot.   Alternatively, stand with both hands on the doorjamb, elbows relaxed.  Act like you are stepping through the door until you feel a stretch at shoulders and chest.  Hold 15 seconds and repeat 3 times.  Do twice a day.  Or do to relieve a cramp.  Copyright  VHI. All rights reserved.

## 2015-07-21 NOTE — Therapy (Signed)
Emily Griffith, [place], [address] Phone: [phone]   Fax:  [phone]  Physical Therapy Treatment  Patient Details  Name: Emily Griffith MRN: [id number] Date of Birth: 1956-01-28 No Data Recorded  Encounter Date: 07/21/2015      PT End of Session - 07/21/15 2125    Visit Number 5   Number of Visits 12   Date for PT Re-Evaluation 08/05/15   PT Start Time 8546   PT Stop Time 1436   PT Time Calculation (min) 48 min   Activity Tolerance Patient tolerated treatment well;Patient limited by pain   Behavior During Therapy [name] for tasks assessed/performed      Past Medical History  Diagnosis Date  . Chicken pox   . Hypertension   . Hyperlipidemia   . IBS (irritable bowel syndrome)   . Breast cancer of upper-inner quadrant of left female breast ([place]) 12/20/2014  . History of hiatal hernia     Past Surgical History  Procedure Laterality Date  . Laser ablation of the cervix  1991  . Colonoscopy  06/18/2005  . Mastectomy w/ sentinel node biopsy Left 06/11/2015  . Simple mastectomy with axillary sentinel node biopsy Left 06/11/2015    Procedure: LEFT TOTAL MASTECTOMY WITH LEFT SENTINEL NODE BIOPSY;  Surgeon: Fanny Skates, MD;  Location: [place];  Service: General;  Laterality: Left;    There were no vitals filed for this visit.  Visit Diagnosis:  Shoulder stiffness, left  Left shoulder pain      Subjective Assessment - 07/21/15 1350    Subjective "I probably have been getting more cramping here (left upper chest) than last Thursday."  "It seemed like it had gone away for a while, but it's back--no matter how much sretching I do."   Currently in Pain? Yes   Pain Score 6    Pain Location Chest   Pain Orientation Left   Aggravating Factors  possibly more exercise   Pain Relieving Factors can't find anything            [facility] PT Assessment - 07/21/15 0001    AROM   Left Shoulder Flexion 125  Degrees   Left Shoulder ABduction 113 Degrees                     [facility] Adult PT Treatment/Exercise - 07/21/15 0001    Shoulder Exercises: Standing   Other Standing Exercises unilateral and bilateral shoulder ER/chest stretches at doorjamb instructed   Manual Therapy   Manual Therapy Soft tissue mobilization;Scapular mobilization   Soft tissue mobilization at left axilla, mobilization of cording there; trial of neural tension stretch   Myofascial Release left UE myofascial pulling in supine, limited ROM   Scapular Mobilization to left, in right sidelying   Manual Lymphatic Drainage (MLD) in supine, brief session of manual lymph drainage to anterior chest, in sidelying, manual lymph drainage to lateral chest and back with posterior interaxillary anastamosis   Passive ROM To Lt shoulder very gently and slowly to pt.'s tolerance                 PT Education - 07/21/15 2124    Education provided Yes   Education Details unilateral and bilateral doorjamb shoulder ER/chest stretches   Person(s) Educated Patient   Methods Explanation;Demonstration;Handout   Comprehension Verbalized understanding;Returned demonstration                [facility] Goals - 07/01/15 1823  CC Long Term Goal  #1   Title Patient with verbalize an understanding of lymphedema risk reduction precautions (target 08/05/2015)   Time 4   Period Weeks   Status New   CC Long Term Goal  #2   Title Patient will improve left shoulder abduction to 120 degrees so that she can achieve position needed for radiation therapy.(target 08/05/2015)   Time 4   Period Weeks   Status New   CC Long Term Goal  #3   Title Patient will decrease the DASH score to < 30   to demonstrate increased functional use of upper extremity(target 08/05/2015)   Baseline 70.45   Time 4   Period Weeks   Status New   CC Long Term Goal  #4   Title Patient will be independent in a home exercise program (target  08/05/2015)   Time 4   Period Weeks   Status New            Plan - 07/21/15 2125    Clinical Impression Statement Patient with improved active flexion but a slight decrease in active abduction of left shoulder today.  She does look like she will be able to get into position for radiation if that is what is decided on.     Pt will benefit from skilled therapeutic intervention in order to improve on the following deficits Decreased range of motion;Increased edema;Impaired UE functional use;Pain;Decreased strength;Decreased knowledge of precautions;Decreased knowledge of use of DME;Impaired perceived functional ability;Postural dysfunction   Rehab Potential Excellent   PT Frequency 3x / week   PT Duration 4 weeks   PT Treatment/Interventions Therapeutic exercise;Patient/family education;Passive range of motion;Manual techniques   PT Next Visit Plan Continue ROM and manual techniques to increase ROM.   PT Home Exercise Plan Add doorjamb stretches to current HEP.   Consulted and Agree with Plan of Care Patient        Problem List Patient Active Problem List   Diagnosis Date Noted  . Cancer of central portion of left female breast ([place]) 06/11/2015  . Hiatal hernia 05/23/2015  . Breast cancer of upper-inner quadrant of left female breast ([place]) 12/20/2014  . Cervical cancer screening 11/15/2014  . Severe sprain of left ankle 02/11/2014  . Routine general medical examination at a health care facility 09/12/2013  . HTN (hypertension) 03/27/2013  . Hyperlipidemia 03/27/2013  . IBS (irritable bowel syndrome) 03/27/2013    Emily Griffith 07/21/2015, 9:30 PM  Emily Griffith, [place], [address] Phone: [phone]   Fax:  [phone]  Name: Emily Griffith MRN: [id number] Date of Birth: 1955/12/20    Emily Griffith, PT 07/21/2015 9:30 PM

## 2015-07-23 ENCOUNTER — Ambulatory Visit: Payer: BLUE CROSS/BLUE SHIELD | Admitting: Physical Therapy

## 2015-07-23 DIAGNOSIS — M25612 Stiffness of left shoulder, not elsewhere classified: Secondary | ICD-10-CM

## 2015-07-23 DIAGNOSIS — M25512 Pain in left shoulder: Secondary | ICD-10-CM

## 2015-07-23 DIAGNOSIS — R293 Abnormal posture: Secondary | ICD-10-CM | POA: Diagnosis not present

## 2015-07-23 NOTE — Therapy (Addendum)
[place], [place], [address] Phone: [phone]   Fax:  [phone]  Physical Therapy Treatment  Patient Details  Name: Emily Griffith MRN: [id number] Date of Birth: 09-Jun-1956 Referring Provider: Dr. Fanny Skates  Encounter Date: 07/23/2015      PT End of Session - 07/23/15 1156    Visit Number 6   Number of Visits 12   Date for PT Re-Evaluation 08/05/15   PT Start Time 1105   PT Stop Time 1151   PT Time Calculation (min) 46 min   Activity Tolerance Patient tolerated treatment well;Patient limited by pain   Behavior During Therapy [facility] for tasks assessed/performed      Past Medical History  Diagnosis Date  . Chicken pox   . Hypertension   . Hyperlipidemia   . IBS (irritable bowel syndrome)   . Breast cancer of upper-inner quadrant of left female breast ([place]) 12/20/2014  . History of hiatal hernia     Past Surgical History  Procedure Laterality Date  . Laser ablation of the cervix  1991  . Colonoscopy  06/18/2005  . Mastectomy w/ sentinel node biopsy Left 06/11/2015  . Simple mastectomy with axillary sentinel node biopsy Left 06/11/2015    Procedure: LEFT TOTAL MASTECTOMY WITH LEFT SENTINEL NODE BIOPSY;  Surgeon: Fanny Skates, MD;  Location: [place];  Service: General;  Laterality: Left;    There were no vitals filed for this visit.  Visit Diagnosis:  Shoulder stiffness, left  Left shoulder pain      Subjective Assessment - 07/23/15 1109    Subjective Nothing new today.  "It wasn't too bad" after last visit--I didn't have to take an advil or anything.  Did the door jamb stretches.   Currently in Pain? Yes   Pain Score 1    Pain Location Axilla   Pain Orientation Left   Aggravating Factors  stretching   Pain Relieving Factors possibly advil, but she hasn't taken anything in a while            [facility] PT Assessment - 07/23/15 0001    Assessment   Referring Provider Dr.  Fanny Skates                     [facility] Adult PT Treatment/Exercise - 07/23/15 0001    Shoulder Exercises: Supine   Other Supine Exercises with left arm in approx. 90 degrees abduction, lower trunk rotation to right to achieve left shoulder/upper lateral chest stretch   Shoulder Exercises: Pulleys   Flexion 3 minutes   ABduction 3 minutes   Shoulder Exercises: Therapy Ball   Flexion 10 reps   Manual Therapy   Manual Therapy Other (comment)   Soft tissue mobilization at left axilla, mobilization of cording there; trial of neural tension stretch; gently at left anterior trunk with therapist's hands proximal to and distal to incision   Myofascial Release left UE myofascial pulling in supine, limited ROM   Scapular Mobilization to left, in right sidelying, for protraction and depression; also in supine, left scapular depression with right neck sidebend to stretch left upper trap   Passive ROM To Lt shoulder very gently and slowly to pt.'s tolerance    Other Manual Therapy pressure, gentle stretch at left pect insertion area for cramping and discomfort                        [place] - 07/23/15 1158  CC Long Term Goal  #1   Title Patient with verbalize an understanding of lymphedema risk reduction precautions (target 08/05/2015)   Status On-going   CC Long Term Goal  #2   Title Patient will improve left shoulder abduction to 120 degrees so that she can achieve position needed for radiation therapy.(target 08/05/2015)   Status On-going   CC Long Term Goal  #3   Title Patient will decrease the DASH score to < 30   to demonstrate increased functional use of upper extremity(target 08/05/2015)   Status On-going   CC Long Term Goal  #4   Title Patient will be independent in a home exercise program (target 08/05/2015)   Status On-going            Plan - 07/23/15 1156    Clinical Impression Statement Patient did well with stretching today, but  still limited by pain; cording visible and palpable at left axilla with shoulder in abduction; pt. felt benefit of left upper trap stretches to relax that muscle.   Pt will benefit from skilled therapeutic intervention in order to improve on the following deficits Decreased range of motion;Increased edema;Impaired UE functional use;Pain;Decreased strength;Decreased knowledge of precautions;Decreased knowledge of use of DME;Impaired perceived functional ability;Postural dysfunction   Rehab Potential Excellent   PT Frequency 3x / week   PT Duration 4 weeks   PT Treatment/Interventions Therapeutic exercise;Manual techniques;Passive range of motion   PT Next Visit Plan Continue ROM and manual techniques to increase ROM.   Consulted and Agree with Plan of Care Patient        Problem List Patient Active Problem List   Diagnosis Date Noted  . Cancer of central portion of left female breast ([place]) 06/11/2015  . Hiatal hernia 05/23/2015  . Breast cancer of upper-inner quadrant of left female breast ([place]) 12/20/2014  . Cervical cancer screening 11/15/2014  . Severe sprain of left ankle 02/11/2014  . Routine general medical examination at a health care facility 09/12/2013  . HTN (hypertension) 03/27/2013  . Hyperlipidemia 03/27/2013  . IBS (irritable bowel syndrome) 03/27/2013    [place],DONNA 07/23/2015, 12:01 PM  [place], [place], [address] Phone: [phone]   Fax:  [phone]  Name: Emily Griffith MRN: [id number] Date of Birth: Sep 09, 1956    [name], PT 07/23/2015 12:01 PM  PHYSICAL THERAPY DISCHARGE SUMMARY  Visits from Start of Care: 6  Current functional level related to goals / functional outcomes: Goals were partially but not fully met as noted above.   Remaining deficits: Unknown.  It was planned for the patient to continue therapy, but she stopped coming.  Unable to assess  current status.   Education / Equipment: HEP. Plan: Patient agrees to discharge.  Patient goals were not met. Patient is being discharged due to not returning since the last visit.  ?????    [name], PT 10/22/2015 1:28 PM

## 2015-07-23 NOTE — Progress Notes (Signed)
Location of Breast Cancer:Left Breast Upper-Inner Quadrant  Histology per Pathology Report:   Receptor Status: ER( + ), PR ( + ), Her2-neu (  [id number]% neg )  Did patient present with symptoms (if so, please note symptoms) or was this found on screening mammography?: screening mammogram  Past/Anticipated interventions by surgeon, if any: Surgery: 06/11/2015: Dr. Renelda Loma Ingram,MD 1. Breast, simple mastectomy, Left - RESIDUAL SCATTERED TUMOR CELLS CONSISTENT WITH INVASIVE GRADE I LOBULAR CARCINOMA INVOLVINGAPPROXIMATELY 5.9 CM WORTH OF BREAST PARENCHYMA. - LOBULAR NEOPLASIA (ATYPICAL LOBULAR HYPERPLASIA).- SCATTERED CALCIFICATIONS PRESENT.- MARGINS ARE NEGATIVE.- ONE BENIGN LYMPH NODE WITH NO TUMOR SEEN (0/1).- SEE ONCOLOGY TEMPLATE. 2. Lymph node, sentinel, biopsy, Left axillary #1- ONE BENIGN LYMPH NODE WITH NO TUMOR SEEN (0/1). 3. Lymph node, sentinel, biopsy, Left axillary #2 1 of 4FINAL for Emily Griffith, Emily Griffith A [phone]) Diagnosis(continued) - ONE BENIGN LYMPH NODE WITH NO TUMOR SEEN (0/1). 4. Lymph node, sentinel, biopsy, Left axillary #3 - ONE LYMPH NODE POSITIVE FOR METASTATIC LOBULAR CARCINOMA (1/1). Follow up 07/18/15 Dr. Dalbert Batman, MD;  F/u again 6 months, mammogram Feb, or March 2017  Past/Anticipated interventions by medical oncology, if any: Chemotherapy : 6 cylces Chemotherapy with Dr. 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           Lymphedema issues, if any: 07/28/15 & 07/30/15 treatment  Pain issues, if any: Physical therapy  For left shoulder,   SAFETY ISSUES: NO  Prior radiation? NO  Pacemaker/ICD? NO  Possible current pregnancy? Is the patient on methotrexate?  NO Current Complaints / other details: Seen in Breast Clinic 12/25/14, Married, menarche age 59 [id number] no HRT , LMP=03/2013; never smoker, alcohol yes, no drug use, Maternal and  Paternal Uncles unknown cancers,Mother HTN, Father sudden death age 60   Allergies: lactose intolerance Hx right breast bx benign ,HX laser ablation cervix 1991    MRoslynn AmbleJFelicita Gage RN 07/23/2015,10:44 AM

## 2015-07-24 ENCOUNTER — Ambulatory Visit: Payer: BLUE CROSS/BLUE SHIELD | Admitting: Radiation Oncology

## 2015-07-24 ENCOUNTER — Ambulatory Visit
Admission: RE | Admit: 2015-07-24 | Discharge: 2015-07-24 | Disposition: A | Discharge status: Home / Self Care | Payer: BLUE CROSS/BLUE SHIELD | Source: Ambulatory Visit | Attending: Radiation Oncology | Admitting: Radiation Oncology

## 2015-07-24 ENCOUNTER — Encounter: Payer: Self-pay | Admitting: Radiation Oncology

## 2015-07-24 VITALS — BP 143/99 | HR 84 | Resp 16 | Ht 64.0 in | Wt 155.8 lb

## 2015-07-24 DIAGNOSIS — C50212 Malignant neoplasm of upper-inner quadrant of left female breast: Secondary | ICD-10-CM

## 2015-07-24 DIAGNOSIS — Z170 Estrogen receptor positive status [ER+]: Secondary | ICD-10-CM | POA: Diagnosis not present

## 2015-07-24 DIAGNOSIS — Z510 Encounter for antineoplastic radiation therapy: Secondary | ICD-10-CM | POA: Insufficient documentation

## 2015-07-24 DIAGNOSIS — Z9012 Acquired absence of left breast and nipple: Secondary | ICD-10-CM | POA: Insufficient documentation

## 2015-07-24 NOTE — Progress Notes (Signed)
See progress note under physician encounter. 

## 2015-07-24 NOTE — Progress Notes (Signed)
Patient completed chemotherapy on August 5th and surgery on September 9th. Reports she has healed well following surgery. Participates in physical therapy twice a week and has gained almost complete ROM of her left upper extremity back. Patient demonstrates that she can almost completely lift her arms above her head. No lymphedema noted. BP elevated. Reports she failed to take her lisinopril this morning. Provided patient with a glass of water to take her medication. Patient works full time as a English as a second language teacher.

## 2015-07-24 NOTE — Progress Notes (Signed)
Radiation Oncology         [phone] ________________________________  Name: Emily Griffith      MRN: [id number]          Date: 07/24/2015              DOB: 1956-05-09  Optical Surface Tracking Plan:  Since intensity modulated radiotherapy (IMRT) and 3D conformal radiation treatment methods are predicated on accurate and precise positioning for treatment, intrafraction motion monitoring is medically necessary to ensure accurate and safe treatment delivery.  The ability to quantify intrafraction motion without excessive ionizing radiation dose can only be performed with optical surface tracking. Accordingly, surface imaging offers the opportunity to obtain 3D measurements of patient position throughout IMRT and 3D treatments without excessive radiation exposure.  I am ordering optical surface tracking for this patient's upcoming course of radiotherapy. ________________________________ Signature   Reference:   Ursula Alert, J, et al. Surface imaging-based analysis of intrafraction motion for breast radiotherapy patients.Journal of [place], n. 6, nov. 2014. ISSN [id number].   Available at: <http://www.jacmp.org/index.php/jacmp/article/view/4957>.      This document serves as a record of services personally performed by Thea Silversmith , MD. It was created on her behalf by Lenn Cal, a trained medical scribe. The creation of this record is based on the scribe's personal observations and the provider's statements to them. This document has been checked and approved by the attending provider.   ------------------------------------------------  Thea Silversmith, MD

## 2015-07-24 NOTE — Progress Notes (Signed)
Name: Emily Griffith   MRN: [id number]  Date:  07/24/2015  DOB: 01-24-1956  Status:outpatient   DIAGNOSIS: Left Breast cancer.  CONSENT VERIFIED: yes SET UP: Patient is setup supine  IMMOBILIZATION:  The following immobilization was used:Custom Moldable Pillow, breast board.  NARRATIVE: Emily Griffith was brought to the [facility].  Identity was confirmed.  All relevant records and images related to the planned course of therapy were reviewed.  Then, the patient was positioned in a stable reproducible clinical set-up for radiation therapy.  Wires were placed to delineate the clinical extent of breast tissue. A wire was placed on the scar as well.  CT images were obtained.  An isocenter was placed. Skin markings were placed.  The position of the heart was then analyzed.  Due to the proximity of the heart to the chest wall, I felt she would benefit from deep inspiration breath hold for cardiac sparing.  She was then coached and rescanned in the breath hold position.  Acceptable cardiac sparing was achieved. The CT images were loaded into the planning software where the target and avoidance structures were contoured.  The radiation prescription was entered and confirmed. The patient was discharged in stable condition and tolerated simulation well.    TREATMENT PLANNING NOTE/3D Simulation Note Treatment planning then occurred. I have requested : MLC's, isodose plan, basic dose calculation  3D simulation was performed.  I personally supervised and oversaw the construction of 5 medically necessary complex treatment devices in the form of MLCs which will be used for beam modification purposes and to protect critical structures including the heart and lung as well as the immobilization devices which will be used to ensure reproducible set up.  I have requested a dose volume histogram of the heart lung and tumor cavity.    Special treatment procedure was performed today due to the  extra time and effort required by myself to plan and prepare this patient for deep inspiration breath hold technique.  I have determined cardiac sparing to be of benefit to this patient to prevent long term cardiac damage due to radiation of the heart.  Bellows were placed on the patient's abdomen. To facilitate cardiac sparing, the patient was coached by the radiation therapists on breath hold techniques and breathing practice was performed. Practice waveforms were obtained. The patient was then scanned while maintaining breath hold in the treatment position.  This image was then transferred over to the imaging specialist. The imaging specialist then created a fusion of the free breathing and breath hold scans using the chest wall as the stable structure. I personally reviewed the fusion in axial, coronal and sagittal image planes.  Excellent cardiac sparing was obtained.  I felt the patient is an appropriate candidate for breath hold and the patient will be treated as such.  The image fusion was then reviewed with the patient to reinforce the necessity of reproducible breath hold.

## 2015-07-24 NOTE — Progress Notes (Signed)
Department of Radiation Oncology  Phone:  [phone] Fax:        [phone]   Name: Emily Griffith MRN: [id number]  DOB: [birth year]  Date: 07/24/2015  Follow Up Visit Note  Diagnosis: Breast cancer of upper-inner quadrant of left female breast [facility])   Staging form: Breast, AJCC 7th Edition     Clinical stage from 12/25/2014: Stage IIB (T3, N0, M0) - Unsigned       Staging comments: Staged at breast conference on 3.23.16      Pathologic stage from 06/11/2015: Stage IIIA (yT3, N1a, cM0) - Unsigned   Summary and Interval since last radiation: No previous  Interval History: Emily Griffith presents today for routine follow-up appointment with radiation oncology. She completed neoadjuvant chemotherapy in August of 2016. She had her mastectomy on 06/11/2015 and this showed residual tumor cells involving 5.9cm of the breast with negative margins. One out of three sentinel lymph nodes was positive. There was no lymphovacsular invasive and extracapsular extention. It was ER/PR positive and HER-2 negative. She did not have immediate reconstruction and has seen physical therapy. She presents today ready for radiation. "I was very lucky. It went fast. I did not get sick and just missed one day of work. Now, I just need some hair back," the patient stated in regards to her treatments. She was able to successfully lift her arms above her head without strain. She expressed that she has no interest in reconstruction. The patient projected a healthy mental status and was not accompanied by her husband today.    Physical Exam:  Filed Vitals:   07/24/15 0854  BP: 143/99  Pulse: 84  Resp: 16  Height: _0  (1.626 m)  Weight: 155 lb 12.8 oz (70.67 kg)  SpO2: 100%  The patient is alert and oriented. There is no significant changes to the status of overall health to be noted at this time. Breast: Chest wall shows healing surgical incision with no evidence of infection   IMPRESSION: Emily Griffith is a 59 y.o.  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 She understands that she has access to custom fit prosthesis and most insurance will provide these annually. She understands that her CT planning session is to take place later today and also comprehends that symptoms of fatigue may occur about a month after radiation is completed and that this is normal. The patient understands that she can access her appointments and medical records via [place].   PLAN: I spoke to the patient today regarding her diagnosis and options for treatment. We discussed the equivalence in terms of survival and local failure between mastectomy and breast conservation. We discussed the role of radiation in decreasing local failures in patients who undergo mastectomy and have risk factors for recurrence including positive lymph nodes and/or tumors over 5 cm and/or positive margins. We discussed the process of simulation and the placement tattoos. We discussed the purpose and process of the breath hold technique. She was informed of certain lotions and deodorant to use to reduce possible side effects of treatment. We discussed 5 1/2 weeks of treatment as an outpatient. We discussed the possibility of asymptomatic lung damage. We discussed the low likelihood of secondary malignancies. We discussed the possible side effects including but not limited to skin redness, fatigue, permanent skin darkening, and chest wall swelling. We discussed increased complications that can occur with reconstruction after radiation.  Consent documentation was reviewed and  signed.   This document serves as a record of services personally performed by Thea Silversmith , MD. It was created on her behalf by Lenn Cal, a trained medical scribe. The creation of this record is based on the scribe's personal observations and the provider's statements to them. This document  has been checked and approved by the attending provider.   ------------------------------------------------  Thea Silversmith, MD

## 2015-07-28 ENCOUNTER — Ambulatory Visit: Payer: BLUE CROSS/BLUE SHIELD

## 2015-07-30 ENCOUNTER — Ambulatory Visit: Payer: BLUE CROSS/BLUE SHIELD | Admitting: Physical Therapy

## 2015-08-01 ENCOUNTER — Ambulatory Visit
Admission: RE | Admit: 2015-08-01 | Discharge: 2015-08-01 | Disposition: A | Discharge status: Home / Self Care | Payer: BLUE CROSS/BLUE SHIELD | Source: Ambulatory Visit | Attending: Radiation Oncology | Admitting: Radiation Oncology

## 2015-08-01 DIAGNOSIS — Z510 Encounter for antineoplastic radiation therapy: Secondary | ICD-10-CM | POA: Diagnosis not present

## 2015-08-03 ENCOUNTER — Ambulatory Visit: Admission: RE | Admit: 2015-08-03 | Payer: BLUE CROSS/BLUE SHIELD | Source: Ambulatory Visit

## 2015-08-04 ENCOUNTER — Ambulatory Visit: Payer: BLUE CROSS/BLUE SHIELD

## 2015-08-04 ENCOUNTER — Ambulatory Visit
Admission: RE | Admit: 2015-08-04 | Discharge: 2015-08-04 | Disposition: A | Discharge status: Home / Self Care | Payer: BLUE CROSS/BLUE SHIELD | Source: Ambulatory Visit | Attending: Radiation Oncology | Admitting: Radiation Oncology

## 2015-08-04 DIAGNOSIS — Z510 Encounter for antineoplastic radiation therapy: Secondary | ICD-10-CM | POA: Diagnosis not present

## 2015-08-05 ENCOUNTER — Ambulatory Visit
Admission: RE | Admit: 2015-08-05 | Discharge: 2015-08-05 | Disposition: A | Discharge status: Home / Self Care | Payer: BLUE CROSS/BLUE SHIELD | Source: Ambulatory Visit | Attending: Radiation Oncology | Admitting: Radiation Oncology

## 2015-08-05 DIAGNOSIS — C50212 Malignant neoplasm of upper-inner quadrant of left female breast: Secondary | ICD-10-CM

## 2015-08-05 DIAGNOSIS — Z510 Encounter for antineoplastic radiation therapy: Secondary | ICD-10-CM | POA: Diagnosis not present

## 2015-08-05 NOTE — Progress Notes (Signed)
Weekly Management Note Current Dose: 3.6  Gy  Projected Dose: 50.4 Gy   Narrative:  The patient presents for routine under treatment assessment.  CBCT/MVCT images/Port film x-rays were reviewed.  The chart was checked. Doing well. No complaints.   Physical Findings: Weight:  . Unchanged  Impression:  The patient is tolerating radiation.  Plan:  Continue treatment as planned. Rn education performed. Start radiaplex.

## 2015-08-06 ENCOUNTER — Ambulatory Visit
Admission: RE | Admit: 2015-08-06 | Discharge: 2015-08-06 | Disposition: A | Discharge status: Home / Self Care | Payer: BLUE CROSS/BLUE SHIELD | Source: Ambulatory Visit | Attending: Radiation Oncology | Admitting: Radiation Oncology

## 2015-08-06 DIAGNOSIS — Z510 Encounter for antineoplastic radiation therapy: Secondary | ICD-10-CM | POA: Diagnosis not present

## 2015-08-07 ENCOUNTER — Ambulatory Visit
Admission: RE | Admit: 2015-08-07 | Discharge: 2015-08-07 | Disposition: A | Discharge status: Home / Self Care | Payer: BLUE CROSS/BLUE SHIELD | Source: Ambulatory Visit | Attending: Radiation Oncology | Admitting: Radiation Oncology

## 2015-08-07 DIAGNOSIS — Z510 Encounter for antineoplastic radiation therapy: Secondary | ICD-10-CM | POA: Diagnosis not present

## 2015-08-08 ENCOUNTER — Ambulatory Visit
Admission: RE | Admit: 2015-08-08 | Discharge: 2015-08-08 | Disposition: A | Discharge status: Home / Self Care | Payer: BLUE CROSS/BLUE SHIELD | Source: Ambulatory Visit | Attending: Radiation Oncology | Admitting: Radiation Oncology

## 2015-08-08 DIAGNOSIS — Z510 Encounter for antineoplastic radiation therapy: Secondary | ICD-10-CM | POA: Diagnosis not present

## 2015-08-11 ENCOUNTER — Telehealth: Payer: Self-pay | Admitting: *Deleted

## 2015-08-11 ENCOUNTER — Ambulatory Visit
Admission: RE | Admit: 2015-08-11 | Discharge: 2015-08-11 | Disposition: A | Discharge status: Home / Self Care | Payer: BLUE CROSS/BLUE SHIELD | Source: Ambulatory Visit | Attending: Radiation Oncology | Admitting: Radiation Oncology

## 2015-08-11 DIAGNOSIS — Z510 Encounter for antineoplastic radiation therapy: Secondary | ICD-10-CM | POA: Diagnosis not present

## 2015-08-11 NOTE — Telephone Encounter (Signed)
Called pt to assess needs during xrt. Relate doing well and without complaints. Encourage pt to call with questions or concerns. Received verbal understanding.

## 2015-08-12 ENCOUNTER — Ambulatory Visit
Admission: RE | Admit: 2015-08-12 | Discharge: 2015-08-12 | Disposition: A | Discharge status: Home / Self Care | Payer: BLUE CROSS/BLUE SHIELD | Source: Ambulatory Visit | Attending: Radiation Oncology | Admitting: Radiation Oncology

## 2015-08-12 ENCOUNTER — Encounter: Payer: Self-pay | Admitting: Radiation Oncology

## 2015-08-12 VITALS — BP 126/88 | HR 72 | Temp 97.9°F | Ht 64.0 in | Wt 155.1 lb

## 2015-08-12 DIAGNOSIS — Z510 Encounter for antineoplastic radiation therapy: Secondary | ICD-10-CM | POA: Diagnosis not present

## 2015-08-12 DIAGNOSIS — C50212 Malignant neoplasm of upper-inner quadrant of left female breast: Secondary | ICD-10-CM | POA: Insufficient documentation

## 2015-08-12 MED ORDER — ALRA NON-METALLIC DEODORANT (RAD-ONC)
1.0000 "application " | Freq: Once | TOPICAL | Status: AC
  Administered 2015-08-12: 1 via TOPICAL

## 2015-08-12 MED ORDER — RADIAPLEXRX EX GEL
Freq: Once | CUTANEOUS | Status: AC
  Administered 2015-08-12: 17:00:00 via TOPICAL

## 2015-08-12 NOTE — Progress Notes (Signed)
Weekly Management Note Current Dose:   12.6Gy  Projected Dose: 50.4 Gy   Narrative:  The patient presents for routine under treatment assessment.  CBCT/MVCT images/Port film x-rays were reviewed.  The chart was checked. Doing well. Good energy.   Physical Findings: Weight: 155 lb 1.6 oz (70.353 kg). Unchanged  Impression:  The patient is tolerating radiation.  Plan:  Continue treatment as planned. OK to miss tx Nov 30 and Dec 1 for business trip if needed.

## 2015-08-12 NOTE — Progress Notes (Signed)
Pt here for patient teaching.  Pt given Radiation and You booklet, skin care instructions, Alra deodorant and Radiaplex gel. Pt reports they have watched, not watched and refused to watch the Radiation Therapy Education video on August 12, 2015.  Reviewed areas of pertinence such as fatigue, skin changes, breast tenderness and breast swelling . Pt able to give teach back of to pat skin, use unscented/gentle soap and drink plenty of water,apply Radiaplex bid, avoid applying anything to skin within 4 hours of treatment, avoid wearing an under wire bra and to use an electric razor if they must shave. Pt verbalizes understanding of information given and will contact nursing with any questions or concerns.    Given Video Link - Http://rtanswers.org/treatmentinformation/whattoexpect/index

## 2015-08-12 NOTE — Progress Notes (Signed)
Ms. Vinciguerra has received 7 fractions to her let chest wall.  No changes at this time.  Education today.

## 2015-08-13 ENCOUNTER — Ambulatory Visit
Admission: RE | Admit: 2015-08-13 | Discharge: 2015-08-13 | Disposition: A | Discharge status: Home / Self Care | Payer: BLUE CROSS/BLUE SHIELD | Source: Ambulatory Visit | Attending: Radiation Oncology | Admitting: Radiation Oncology

## 2015-08-13 DIAGNOSIS — Z510 Encounter for antineoplastic radiation therapy: Secondary | ICD-10-CM | POA: Diagnosis not present

## 2015-08-14 ENCOUNTER — Ambulatory Visit
Admission: RE | Admit: 2015-08-14 | Discharge: 2015-08-14 | Disposition: A | Discharge status: Home / Self Care | Payer: BLUE CROSS/BLUE SHIELD | Source: Ambulatory Visit | Attending: Radiation Oncology | Admitting: Radiation Oncology

## 2015-08-14 DIAGNOSIS — Z510 Encounter for antineoplastic radiation therapy: Secondary | ICD-10-CM | POA: Diagnosis not present

## 2015-08-15 ENCOUNTER — Other Ambulatory Visit: Payer: Self-pay | Admitting: Family Medicine

## 2015-08-15 ENCOUNTER — Ambulatory Visit
Admission: RE | Admit: 2015-08-15 | Discharge: 2015-08-15 | Disposition: A | Discharge status: Home / Self Care | Payer: BLUE CROSS/BLUE SHIELD | Source: Ambulatory Visit | Attending: Radiation Oncology | Admitting: Radiation Oncology

## 2015-08-15 DIAGNOSIS — Z510 Encounter for antineoplastic radiation therapy: Secondary | ICD-10-CM | POA: Diagnosis not present

## 2015-08-15 NOTE — Telephone Encounter (Signed)
Medication filled to pharmacy as requested.   

## 2015-08-18 ENCOUNTER — Ambulatory Visit
Admission: RE | Admit: 2015-08-18 | Discharge: 2015-08-18 | Disposition: A | Discharge status: Home / Self Care | Payer: BLUE CROSS/BLUE SHIELD | Source: Ambulatory Visit | Attending: Radiation Oncology | Admitting: Radiation Oncology

## 2015-08-18 DIAGNOSIS — Z510 Encounter for antineoplastic radiation therapy: Secondary | ICD-10-CM | POA: Diagnosis not present

## 2015-08-19 ENCOUNTER — Ambulatory Visit
Admission: RE | Admit: 2015-08-19 | Discharge: 2015-08-19 | Disposition: A | Discharge status: Home / Self Care | Payer: BLUE CROSS/BLUE SHIELD | Source: Ambulatory Visit | Attending: Radiation Oncology | Admitting: Radiation Oncology

## 2015-08-19 ENCOUNTER — Encounter: Payer: Self-pay | Admitting: Radiation Oncology

## 2015-08-19 VITALS — BP 117/81 | HR 73 | Temp 98.0°F | Wt 156.0 lb

## 2015-08-19 DIAGNOSIS — Z510 Encounter for antineoplastic radiation therapy: Secondary | ICD-10-CM | POA: Diagnosis not present

## 2015-08-19 DIAGNOSIS — C50212 Malignant neoplasm of upper-inner quadrant of left female breast: Secondary | ICD-10-CM

## 2015-08-19 NOTE — Progress Notes (Signed)
Weekly Management Note Current Dose:   21.6 Gy  Projected Dose: 50.4 Gy   Narrative:  The patient presents for routine under treatment assessment.  CBCT/MVCT images/Port film x-rays were reviewed.  The chart was checked. Doing well. Good energy. Reviewed SCLV field at tx machine.   Physical Findings: Weight: 156 lb (70.761 kg). Unchanged  Impression:  The patient is tolerating radiation.  Plan:  Continue treatment as planned. Open leaves on clavicle.

## 2015-08-19 NOTE — Progress Notes (Signed)
Mrs. Sicari has received 12 fractions to her Left chestwall.  Denies any pain nor fatigue at this time.  Note mild erythema in the upper, inner portion of the field and in the lower axillary region.

## 2015-08-20 ENCOUNTER — Ambulatory Visit
Admission: RE | Admit: 2015-08-20 | Discharge: 2015-08-20 | Disposition: A | Discharge status: Home / Self Care | Payer: BLUE CROSS/BLUE SHIELD | Source: Ambulatory Visit | Attending: Radiation Oncology | Admitting: Radiation Oncology

## 2015-08-20 DIAGNOSIS — Z510 Encounter for antineoplastic radiation therapy: Secondary | ICD-10-CM | POA: Diagnosis not present

## 2015-08-21 ENCOUNTER — Ambulatory Visit
Admission: RE | Admit: 2015-08-21 | Discharge: 2015-08-21 | Disposition: A | Discharge status: Home / Self Care | Payer: BLUE CROSS/BLUE SHIELD | Source: Ambulatory Visit | Attending: Radiation Oncology | Admitting: Radiation Oncology

## 2015-08-21 DIAGNOSIS — Z510 Encounter for antineoplastic radiation therapy: Secondary | ICD-10-CM | POA: Diagnosis not present

## 2015-08-22 ENCOUNTER — Encounter: Payer: Self-pay | Admitting: Hematology

## 2015-08-22 ENCOUNTER — Ambulatory Visit
Admission: RE | Admit: 2015-08-22 | Discharge: 2015-08-22 | Disposition: A | Discharge status: Home / Self Care | Payer: BLUE CROSS/BLUE SHIELD | Source: Ambulatory Visit | Attending: Radiation Oncology | Admitting: Radiation Oncology

## 2015-08-22 DIAGNOSIS — Z510 Encounter for antineoplastic radiation therapy: Secondary | ICD-10-CM | POA: Diagnosis not present

## 2015-08-24 ENCOUNTER — Ambulatory Visit
Admission: RE | Admit: 2015-08-24 | Discharge: 2015-08-24 | Disposition: A | Discharge status: Home / Self Care | Payer: BLUE CROSS/BLUE SHIELD | Source: Ambulatory Visit | Attending: Radiation Oncology | Admitting: Radiation Oncology

## 2015-08-24 DIAGNOSIS — Z510 Encounter for antineoplastic radiation therapy: Secondary | ICD-10-CM | POA: Diagnosis not present

## 2015-08-25 ENCOUNTER — Ambulatory Visit
Admission: RE | Admit: 2015-08-25 | Discharge: 2015-08-25 | Disposition: A | Discharge status: Home / Self Care | Payer: BLUE CROSS/BLUE SHIELD | Source: Ambulatory Visit | Attending: Radiation Oncology | Admitting: Radiation Oncology

## 2015-08-25 ENCOUNTER — Other Ambulatory Visit: Payer: Self-pay | Admitting: Family Medicine

## 2015-08-25 DIAGNOSIS — Z510 Encounter for antineoplastic radiation therapy: Secondary | ICD-10-CM | POA: Diagnosis not present

## 2015-08-25 NOTE — Telephone Encounter (Signed)
Medication filled to pharmacy as requested.   

## 2015-08-26 ENCOUNTER — Ambulatory Visit
Admission: RE | Admit: 2015-08-26 | Discharge: 2015-08-26 | Disposition: A | Discharge status: Home / Self Care | Payer: BLUE CROSS/BLUE SHIELD | Source: Ambulatory Visit | Attending: Radiation Oncology | Admitting: Radiation Oncology

## 2015-08-26 ENCOUNTER — Encounter: Payer: Self-pay | Admitting: Radiation Oncology

## 2015-08-26 VITALS — BP 110/82 | HR 72 | Temp 98.1°F | Ht 64.0 in | Wt 152.6 lb

## 2015-08-26 DIAGNOSIS — Z510 Encounter for antineoplastic radiation therapy: Secondary | ICD-10-CM | POA: Diagnosis not present

## 2015-08-26 DIAGNOSIS — C50212 Malignant neoplasm of upper-inner quadrant of left female breast: Secondary | ICD-10-CM

## 2015-08-26 NOTE — Progress Notes (Addendum)
Ms. Emily Griffith has received 18 fractions.  Skin pigmentation looks normal today.  Denies pain of discomfort dis afternoon.  Energy level has been good since her last visit.  BP 110/82 mmHg  Pulse 72  Temp(Src) 98.1 F (36.7 C) (Oral)  Ht 5\' 4"  (1.626 m)  Wt 152 lb 9.6 oz (69.219 kg)  BMI 26.18 kg/m2  SpO2 97%  LMP 02/20/2013  Wt Readings from Last 3 Encounters:  08/26/15 152 lb 9.6 oz (69.219 kg)  08/19/15 156 lb (70.761 kg)  08/12/15 155 lb 1.6 oz (70.353 kg)

## 2015-08-26 NOTE — Progress Notes (Signed)
Weekly Management Note Current Dose:  32.4 Gy  Projected Dose: 50.4 Gy   Narrative:  The patient presents for routine under treatment assessment.  CBCT/MVCT images/Port film x-rays were reviewed.  The chart was checked. Doing well. Good energy.   Physical Findings: Weight: 152 lb 9.6 oz (69.219 kg). Unchanged  Impression:  The patient is tolerating radiation.  Plan:  Continue treatment as planned. Continue radiaplex. OK to miss 2 days next week for trip.

## 2015-08-27 ENCOUNTER — Ambulatory Visit
Admission: RE | Admit: 2015-08-27 | Discharge: 2015-08-27 | Disposition: A | Discharge status: Home / Self Care | Payer: BLUE CROSS/BLUE SHIELD | Source: Ambulatory Visit | Attending: Radiation Oncology | Admitting: Radiation Oncology

## 2015-08-27 DIAGNOSIS — Z510 Encounter for antineoplastic radiation therapy: Secondary | ICD-10-CM | POA: Diagnosis not present

## 2015-09-01 ENCOUNTER — Ambulatory Visit
Admission: RE | Admit: 2015-09-01 | Discharge: 2015-09-01 | Disposition: A | Discharge status: Home / Self Care | Payer: BLUE CROSS/BLUE SHIELD | Source: Ambulatory Visit | Attending: Radiation Oncology | Admitting: Radiation Oncology

## 2015-09-01 ENCOUNTER — Ambulatory Visit: Payer: BLUE CROSS/BLUE SHIELD

## 2015-09-01 DIAGNOSIS — Z510 Encounter for antineoplastic radiation therapy: Secondary | ICD-10-CM | POA: Diagnosis not present

## 2015-09-02 ENCOUNTER — Ambulatory Visit: Payer: BLUE CROSS/BLUE SHIELD | Admitting: Hematology

## 2015-09-02 ENCOUNTER — Ambulatory Visit
Admission: RE | Admit: 2015-09-02 | Discharge: 2015-09-02 | Disposition: A | Discharge status: Home / Self Care | Payer: BLUE CROSS/BLUE SHIELD | Source: Ambulatory Visit | Attending: Radiation Oncology | Admitting: Radiation Oncology

## 2015-09-02 ENCOUNTER — Other Ambulatory Visit: Payer: BLUE CROSS/BLUE SHIELD

## 2015-09-02 VITALS — BP 125/85 | HR 85 | Temp 98.5°F

## 2015-09-02 DIAGNOSIS — Z510 Encounter for antineoplastic radiation therapy: Secondary | ICD-10-CM | POA: Diagnosis not present

## 2015-09-02 DIAGNOSIS — C50212 Malignant neoplasm of upper-inner quadrant of left female breast: Secondary | ICD-10-CM

## 2015-09-02 NOTE — Progress Notes (Addendum)
Assessed Emily Griffith skin has redness to left breast using Radiaplex.  Having no pain at this time.  Energy level is good and appetite good.

## 2015-09-02 NOTE — Progress Notes (Signed)
Mrs  

## 2015-09-02 NOTE — Progress Notes (Signed)
Weekly Management Note Current Dose:  37.8 Gy  Projected Dose: 50.4 Gy   Narrative:  The patient presents for routine under treatment assessment.  CBCT/MVCT images/Port film x-rays were reviewed.  The chart was checked. Doing well. Skin redness medially.   Physical Findings: Dermatitis medially.   Impression:  The patient is tolerating radiation.  Plan:  Continue treatment as planned. Continue radiaplex.

## 2015-09-03 ENCOUNTER — Ambulatory Visit: Payer: BLUE CROSS/BLUE SHIELD

## 2015-09-03 ENCOUNTER — Other Ambulatory Visit: Payer: Self-pay | Admitting: *Deleted

## 2015-09-04 ENCOUNTER — Ambulatory Visit: Payer: BLUE CROSS/BLUE SHIELD

## 2015-09-05 ENCOUNTER — Ambulatory Visit
Admission: RE | Admit: 2015-09-05 | Discharge: 2015-09-05 | Disposition: A | Discharge status: Home / Self Care | Payer: BLUE CROSS/BLUE SHIELD | Source: Ambulatory Visit | Attending: Radiation Oncology | Admitting: Radiation Oncology

## 2015-09-05 DIAGNOSIS — Z510 Encounter for antineoplastic radiation therapy: Secondary | ICD-10-CM | POA: Diagnosis not present

## 2015-09-08 ENCOUNTER — Ambulatory Visit
Admission: RE | Admit: 2015-09-08 | Discharge: 2015-09-08 | Disposition: A | Discharge status: Home / Self Care | Payer: BLUE CROSS/BLUE SHIELD | Source: Ambulatory Visit | Attending: Radiation Oncology | Admitting: Radiation Oncology

## 2015-09-08 DIAGNOSIS — Z510 Encounter for antineoplastic radiation therapy: Secondary | ICD-10-CM | POA: Diagnosis not present

## 2015-09-09 ENCOUNTER — Ambulatory Visit
Admission: RE | Admit: 2015-09-09 | Discharge: 2015-09-09 | Disposition: A | Discharge status: Home / Self Care | Payer: BLUE CROSS/BLUE SHIELD | Source: Ambulatory Visit | Attending: Radiation Oncology | Admitting: Radiation Oncology

## 2015-09-09 DIAGNOSIS — C50212 Malignant neoplasm of upper-inner quadrant of left female breast: Secondary | ICD-10-CM

## 2015-09-09 DIAGNOSIS — Z510 Encounter for antineoplastic radiation therapy: Secondary | ICD-10-CM | POA: Diagnosis not present

## 2015-09-09 NOTE — Progress Notes (Addendum)
Weekly Management Note Current Dose:  21.6 Gy  Projected Dose: 50.4 Gy   Narrative:  The patient presents for routine under treatment assessment.  CBCT/MVCT images/Port film x-rays were reviewed.  The chart was checked. Doing well. Skin redness medially.   Physical Findings: Dermatitis medially.   Impression:  The patient is tolerating radiation.  Plan:  Continue treatment as planned. Continue radiaplex. Add hydrocortisone if necessary.

## 2015-09-10 ENCOUNTER — Ambulatory Visit
Admission: RE | Admit: 2015-09-10 | Discharge: 2015-09-10 | Disposition: A | Discharge status: Home / Self Care | Payer: BLUE CROSS/BLUE SHIELD | Source: Ambulatory Visit | Attending: Radiation Oncology | Admitting: Radiation Oncology

## 2015-09-10 DIAGNOSIS — Z510 Encounter for antineoplastic radiation therapy: Secondary | ICD-10-CM | POA: Diagnosis not present

## 2015-09-11 ENCOUNTER — Ambulatory Visit
Admission: RE | Admit: 2015-09-11 | Discharge: 2015-09-11 | Disposition: A | Discharge status: Home / Self Care | Payer: BLUE CROSS/BLUE SHIELD | Source: Ambulatory Visit | Attending: Radiation Oncology | Admitting: Radiation Oncology

## 2015-09-11 DIAGNOSIS — Z510 Encounter for antineoplastic radiation therapy: Secondary | ICD-10-CM | POA: Diagnosis not present

## 2015-09-12 ENCOUNTER — Ambulatory Visit: Payer: BLUE CROSS/BLUE SHIELD | Admitting: Radiation Oncology

## 2015-09-12 ENCOUNTER — Ambulatory Visit
Admission: RE | Admit: 2015-09-12 | Discharge: 2015-09-12 | Disposition: A | Discharge status: Home / Self Care | Payer: BLUE CROSS/BLUE SHIELD | Source: Ambulatory Visit | Attending: Radiation Oncology | Admitting: Radiation Oncology

## 2015-09-12 ENCOUNTER — Ambulatory Visit: Payer: BLUE CROSS/BLUE SHIELD

## 2015-09-12 DIAGNOSIS — Z510 Encounter for antineoplastic radiation therapy: Secondary | ICD-10-CM | POA: Diagnosis not present

## 2015-09-15 ENCOUNTER — Ambulatory Visit: Payer: BLUE CROSS/BLUE SHIELD | Admitting: Radiation Oncology

## 2015-09-15 ENCOUNTER — Ambulatory Visit: Payer: BLUE CROSS/BLUE SHIELD

## 2015-09-15 ENCOUNTER — Encounter: Payer: Self-pay | Admitting: Radiation Oncology

## 2015-09-15 ENCOUNTER — Other Ambulatory Visit: Payer: Self-pay | Admitting: Family Medicine

## 2015-09-15 ENCOUNTER — Encounter: Payer: BLUE CROSS/BLUE SHIELD | Admitting: Radiation Oncology

## 2015-09-15 DIAGNOSIS — Z510 Encounter for antineoplastic radiation therapy: Secondary | ICD-10-CM | POA: Diagnosis not present

## 2015-09-15 NOTE — Telephone Encounter (Signed)
Medication filled to pharmacy as requested.   

## 2015-09-15 NOTE — Progress Notes (Signed)
   Weekly Management Note:  Outpatient  breast cancer  Current Dose:  48.6 Gy  Projected Dose: 60.4 Gy   Narrative:  The patient presents for routine under treatment assessment.  CBCT/MVCT images/Port film x-rays were reviewed.  The chart was checked. Has a blister on chest wall, therapists called me to [facility]  Physical Findings:  vitals were not taken for this visit.  Wt Readings from Last 3 Encounters:  08/26/15 152 lb 9.6 oz (69.219 kg)  08/19/15 156 lb (70.761 kg)  08/12/15 155 lb 1.6 oz (70.353 kg)   Fluid filled blister at lateral left chest wall, outside boost field.  Impression:  The patient is tolerating radiotherapy.  Plan:  Continue radiotherapy as planned. OK to give final chest wall fraction today. Boost will be starting tomorrow.  She doesn't have bolus scheduled today. Neosporin prn oozing of skin  ________________________________   Eppie Gibson, M.D.

## 2015-09-16 ENCOUNTER — Ambulatory Visit
Admission: RE | Admit: 2015-09-16 | Discharge: 2015-09-16 | Disposition: A | Discharge status: Home / Self Care | Payer: BLUE CROSS/BLUE SHIELD | Source: Ambulatory Visit | Attending: Radiation Oncology | Admitting: Radiation Oncology

## 2015-09-16 ENCOUNTER — Encounter: Payer: Self-pay | Admitting: Radiation Oncology

## 2015-09-16 VITALS — BP 128/86 | HR 82 | Temp 98.1°F | Ht 64.0 in | Wt 156.5 lb

## 2015-09-16 DIAGNOSIS — Z510 Encounter for antineoplastic radiation therapy: Secondary | ICD-10-CM | POA: Diagnosis not present

## 2015-09-16 DIAGNOSIS — C50212 Malignant neoplasm of upper-inner quadrant of left female breast: Secondary | ICD-10-CM

## 2015-09-16 NOTE — Progress Notes (Signed)
Weekly Management Note Current Dose:  52.4 Gy  Projected Dose: 60.4 Gy   Narrative:  The patient presents for routine under treatment assessment.  CBCT/MVCT images/Port film x-rays were reviewed.  The chart was checked. Doing well. Skin redness medially. Saw by Dr. Isidore Moos. Using neosporin on open area near axilla which looks better.   Physical Findings: Dermatitis medially.   Impression:  The patient is tolerating radiation.  Plan:  Continue treatment as planned. Continue radiaplex. Add hydrocortisone if necessary.  Continue neosporin. Discussed post RT follow up and skin care.

## 2015-09-16 NOTE — Progress Notes (Signed)
Emily Griffith has received 29 fractions.  Appetite is good and energy level is good.  Skin to left breast is red using the Radiaplex gel three times day.  Has a blistered area axilla.  BP 128/86 mmHg  Pulse 82  Temp(Src) 98.1 F (36.7 C) (Oral)  Ht 5\' 4"  (1.626 m)  Wt 156 lb 8 oz (70.988 kg)  BMI 26.85 kg/m2  SpO2 98%  LMP 02/20/2013  Wt Readings from Last 3 Encounters:  09/16/15 156 lb 8 oz (70.988 kg)  08/26/15 152 lb 9.6 oz (69.219 kg)  08/19/15 156 lb (70.761 kg)

## 2015-09-17 ENCOUNTER — Ambulatory Visit
Admission: RE | Admit: 2015-09-17 | Discharge: 2015-09-17 | Disposition: A | Discharge status: Home / Self Care | Payer: BLUE CROSS/BLUE SHIELD | Source: Ambulatory Visit | Attending: Radiation Oncology | Admitting: Radiation Oncology

## 2015-09-17 DIAGNOSIS — Z510 Encounter for antineoplastic radiation therapy: Secondary | ICD-10-CM | POA: Diagnosis not present

## 2015-09-18 ENCOUNTER — Ambulatory Visit: Payer: BLUE CROSS/BLUE SHIELD

## 2015-09-18 ENCOUNTER — Ambulatory Visit
Admission: RE | Admit: 2015-09-18 | Discharge: 2015-09-18 | Disposition: A | Discharge status: Home / Self Care | Payer: BLUE CROSS/BLUE SHIELD | Source: Ambulatory Visit | Attending: Radiation Oncology | Admitting: Radiation Oncology

## 2015-09-18 DIAGNOSIS — Z510 Encounter for antineoplastic radiation therapy: Secondary | ICD-10-CM | POA: Diagnosis not present

## 2015-09-19 ENCOUNTER — Ambulatory Visit: Payer: BLUE CROSS/BLUE SHIELD

## 2015-09-19 ENCOUNTER — Ambulatory Visit
Admission: RE | Admit: 2015-09-19 | Discharge: 2015-09-19 | Disposition: A | Discharge status: Home / Self Care | Payer: BLUE CROSS/BLUE SHIELD | Source: Ambulatory Visit | Attending: Radiation Oncology | Admitting: Radiation Oncology

## 2015-09-19 DIAGNOSIS — Z510 Encounter for antineoplastic radiation therapy: Secondary | ICD-10-CM | POA: Diagnosis not present

## 2015-09-22 ENCOUNTER — Encounter: Payer: Self-pay | Admitting: *Deleted

## 2015-09-22 ENCOUNTER — Encounter: Payer: Self-pay | Admitting: Radiation Oncology

## 2015-09-22 ENCOUNTER — Other Ambulatory Visit: Payer: Self-pay | Admitting: Adult Health

## 2015-09-22 ENCOUNTER — Ambulatory Visit
Admission: RE | Admit: 2015-09-22 | Discharge: 2015-09-22 | Disposition: A | Discharge status: Home / Self Care | Payer: BLUE CROSS/BLUE SHIELD | Source: Ambulatory Visit | Attending: Radiation Oncology | Admitting: Radiation Oncology

## 2015-09-22 VITALS — BP 122/78 | HR 69 | Temp 98.4°F | Resp 12 | Wt 155.3 lb

## 2015-09-22 DIAGNOSIS — C50212 Malignant neoplasm of upper-inner quadrant of left female breast: Secondary | ICD-10-CM

## 2015-09-22 DIAGNOSIS — C50112 Malignant neoplasm of central portion of left female breast: Secondary | ICD-10-CM

## 2015-09-22 DIAGNOSIS — Z510 Encounter for antineoplastic radiation therapy: Secondary | ICD-10-CM | POA: Diagnosis not present

## 2015-09-22 MED ORDER — RADIAPLEXRX EX GEL
Freq: Once | CUTANEOUS | Status: AC
  Administered 2015-09-22: 17:00:00 via TOPICAL

## 2015-09-22 NOTE — Progress Notes (Signed)
  Department of Radiation Oncology  Phone:  [phone] Fax:        [phone]  Weekly Treatment Note    Name: Emily Griffith Date: 09/22/2015 MRN: [id number] DOB: [birth year]   Current dose: 60.4 Gy  Current fraction: 33   MEDICATIONS: Current Outpatient Prescriptions  Medication Sig Dispense Refill  . atorvastatin (LIPITOR) 40 MG tablet TAKE 1 TABLET EVERY NIGHT AT BEDTIME 90 tablet 1  . Cholecalciferol (VITAMIN D) 2000 UNITS CAPS Take 2,000 Units by mouth daily.    Marland Kitchen dicyclomine (BENTYL) 20 MG tablet TAKE 1 TABLET BY MOUTH EVERY 6 HOURS 60 tablet 6  . lisinopril (PRINIVIL,ZESTRIL) 20 MG tablet Take 20 mg by mouth daily.     . pantoprazole (PROTONIX) 40 MG tablet Take 40 mg by mouth daily.    . potassium chloride SA (K-DUR,KLOR-CON) 20 MEQ tablet TAKE 1 TABLET DAILY 90 tablet 1   No current facility-administered medications for this encounter.     ALLERGIES: Lactose intolerance (gi)   LABORATORY DATA:  Lab Results  Component Value Date   WBC 8.8 06/11/2015   HGB 10.2* 06/11/2015   HCT 32.7* 06/11/2015   MCV 96.5 06/11/2015   PLT 244 06/11/2015   Lab Results  Component Value Date   NA 141 05/30/2015   K 3.9 05/30/2015   CL 102 05/23/2015   CO2 27 05/30/2015   Lab Results  Component Value Date   ALT 25 05/30/2015   AST 19 05/30/2015   ALKPHOS 60 05/30/2015   BILITOT 0.53 05/30/2015     NARRATIVE: Emily Griffith was seen today for weekly treatment management. The chart was checked and the patient's films were reviewed.   PAIN: She is currently in no pain.  SKIN: Pt left breast- positive for Hyperpigmentation, Pruritus, erythema and dry desquamation.  Pt denies edema.  Pt continues to apply Radiaplex, Neosporin and Hydrocortisone as directed. OTHER: Pt reports energy is good, shes been golfing.  She is getting a compression sleeve, she is planning [place] February 2017. BP 122/78 mmHg  Pulse 69  Temp(Src) 98.4 F (36.9 C) (Oral)  Resp 12  Wt  155 lb 4.8 oz (70.444 kg)  SpO2 100%  LMP 02/20/2013 Wt Readings from Last 3 Encounters:  09/22/15 155 lb 4.8 oz (70.444 kg)  09/16/15 156 lb 8 oz (70.988 kg)  08/26/15 152 lb 9.6 oz (69.219 kg)   PHYSICAL EXAMINATION: weight is 155 lb 4.8 oz (70.444 kg). Her oral temperature is 98.4 F (36.9 C). Her blood pressure is 122/78 and her pulse is 69. Her respiration is 12 and oxygen saturation is 100%.       Left breast with diffuse erythema with dry desquamation especially in the axilla. More significant dermatitis in the upper aspect of the chest wall. No moist desquamation.  ASSESSMENT: The patient did satisfactorily with treatment.   PLAN: The patient will follow-up in our clinic in 1 month. Continue current skin care. Follow up with Dr.Wentworth.  ------------------------------------------------  Jodelle Gross, MD, PhD  This document serves as a record of services personally performed by Kyung Rudd, MD. It was created on his behalf by Derek Mound, a trained medical scribe. The creation of this record is based on the scribe's personal observations and the provider's statements to them. This document has been checked and approved by the attending provider.

## 2015-09-22 NOTE — Progress Notes (Addendum)
PAIN: She is currently in no pain.  SKIN: Pt left breast- positive for Hyperpigmentation, Pruritus, erythema and dry desquamation.  Pt denies edema.  Pt continues to apply Radiaplex, Neosporin and Hydrocortisone as directed. OTHER: Pt reports energy is good, shes been golfing.  She is getting a compression sleeve, she is planning [place] February 2017. Additional tube of Radiaplex given. Instructed to continue using Radiaplex for the next 2 weeks, and then follow with Vitamin E cream.  Survivorship, FYNN and livestrong pamphlets. BP 122/78 mmHg  Pulse 69  Temp(Src) 98.4 F (36.9 C) (Oral)  Resp 12  Wt 155 lb 4.8 oz (70.444 kg)  SpO2 100%  LMP 02/20/2013 Wt Readings from Last 3 Encounters:  09/22/15 155 lb 4.8 oz (70.444 kg)  09/16/15 156 lb 8 oz (70.988 kg)  08/26/15 152 lb 9.6 oz (69.219 kg)

## 2015-09-26 ENCOUNTER — Other Ambulatory Visit: Payer: Self-pay | Admitting: Adult Health

## 2015-09-30 NOTE — Progress Notes (Signed)
  Radiation Oncology         [phone] ________________________________  Name: Emily Griffith MRN: [id number]  Date: 09/22/2015  DOB: 05-Sep-1956  End of Treatment Note  Diagnosis:  Breast cancer of upper-inner quadrant of left female breast [facility])   Staging form: Breast, AJCC 7th Edition     Clinical stage from 12/25/2014: Stage IIB (T3, N0, M0) - Unsigned       Staging comments: Staged at breast conference on 3.23.16      Pathologic stage from 06/11/2015: Stage IIIA (yT3, N1a, cM0) - Unsigned    Indication for treatment:  Curative      Radiation treatment dates:   08/04/15 to 09/22/15  Site/dose:   Left chest wall / 50.4 Gray @ 1.8 [place] Cables per fraction x 28 fractions Left Supraclavicular fossa /PAB 9 Gray @1 .8 Gray per fraction x 25 fractions Left scar / 10 Gray at [employer] per fraction x 5 fractions  Beams/Energies: Opposed tangents with reduced fields/ 6 and 10 MV photons RAO/PA  6 and 10 MV photons En face with 9 MeV electrons  Narrative: The patient tolerated radiation treatment relatively well.   She was able to work during treatment and had some min or skin irritation and dermatitis which was treated with hydrocortisone and biafene.   Plan: The patient has completed radiation treatment. The patient will return to radiation oncology clinic for routine followup in one month. I advised them to call or return sooner if they have any questions or concerns related to their recovery or treatment.  ------------------------------------------------  Thea Silversmith, MD

## 2015-09-30 NOTE — Progress Notes (Signed)
Name: BETTY GRENIER   MRN: [id number]  Date:  09/10/15   DOB: 31-Jul-1956  Status:outpatient    DIAGNOSIS: Breast cancer of upper-inner quadrant of left female breast [facility])   Staging form: Breast, AJCC 7th Edition     Clinical stage from 12/25/2014: Stage IIB (T3, N0, M0) - Unsigned       Staging comments: Staged at breast conference on 3.23.16      Pathologic stage from 06/11/2015: Stage IIIA (yT3, N1a, cM0) - Unsigned   CONSENT VERIFIED: yes   SET UP: Patient is setup supine   IMMOBILIZATION:  The following immobilization was used:Custom Moldable Pillow, breast board.   NARRATIVE: [name] underwent complex simulation and treatment planning for her boost treatment today.  Her scar plus margin was outlined on the treatment machine.  The depth to the chest wall was measured on her initial planning CT and was felt to be appropriate for treatment with electrons    9  MeV electrons will be prescribed to the 90%  isodose line with 0.5 cm bolus placed daily.   I personally oversaw and approved the construction of a unique block which will be used for beam modification purposes.  A special port plan is requested.

## 2015-10-02 ENCOUNTER — Encounter: Payer: Self-pay | Admitting: Radiation Oncology

## 2015-10-02 ENCOUNTER — Encounter: Payer: Self-pay | Admitting: *Deleted

## 2015-10-02 NOTE — Progress Notes (Signed)
Received in-Basket message at 3:30pm from Ms. Emily Griffith as stated:  "Can someone from your staff please send a prescription for my arm compression hosiery (for traveling) to [facility] store in [place] today Dec 29 or Dec 30th?  Fax for [facility] is [phone].  Sent message to Dr. Pablo Ledger

## 2015-10-03 ENCOUNTER — Encounter: Payer: Self-pay | Admitting: Family Medicine

## 2015-10-03 DIAGNOSIS — C50212 Malignant neoplasm of upper-inner quadrant of left female breast: Secondary | ICD-10-CM

## 2015-10-03 NOTE — Telephone Encounter (Signed)
DME printed and faxed as requested.

## 2015-10-04 ENCOUNTER — Telehealth: Payer: Self-pay | Admitting: Hematology

## 2015-10-04 NOTE — Telephone Encounter (Signed)
Appointments made,calendars and survivorship sheet mailed to patient

## 2015-10-28 ENCOUNTER — Encounter: Payer: Self-pay | Admitting: Hematology

## 2015-10-30 ENCOUNTER — Ambulatory Visit
Admission: RE | Admit: 2015-10-30 | Discharge: 2015-10-30 | Disposition: A | Discharge status: Home / Self Care | Payer: BLUE CROSS/BLUE SHIELD | Source: Ambulatory Visit | Attending: Radiation Oncology | Admitting: Radiation Oncology

## 2015-10-30 ENCOUNTER — Encounter: Payer: Self-pay | Admitting: Radiation Oncology

## 2015-10-30 VITALS — BP 109/72 | HR 76 | Temp 97.9°F | Ht 64.0 in | Wt 155.0 lb

## 2015-10-30 DIAGNOSIS — C50212 Malignant neoplasm of upper-inner quadrant of left female breast: Secondary | ICD-10-CM

## 2015-10-30 HISTORY — DX: Malignant neoplasm of unspecified site of unspecified female breast: C50.919

## 2015-10-30 NOTE — Progress Notes (Signed)
Skin status: One area looks like a tan, no irritation Lotion being used: Radiaplex Bid Have you seen your medical oncologist?No appointment has been made pt. Plans to check with Dr. Earlie Counts office for an appointment ER+,have started AI or Tamoxifen? If not, why? No F/U appointment has been made with made. onc since radiation as completed 09-22-15 note reads to see in 2 months Discuss survivorship appointment: 12-09-15 Booklet given today Offer referral to Livest[employer]/FYNN Flyer/booklet given today Appetite:Good Pain:None Energy level:Good BP 109/72 mmHg  Pulse 76  Temp(Src) 97.9 F (36.6 C) (Oral)  Ht 5\' 4"  (1.626 m)  Wt 155 lb (70.308 kg)  BMI 26.59 kg/m2  SpO2 99%  LMP 02/20/2013  Wt Readings from Last 3 Encounters:  10/30/15 155 lb (70.308 kg)  09/22/15 155 lb 4.8 oz (70.444 kg)  09/16/15 156 lb 8 oz (70.988 kg)

## 2015-10-30 NOTE — Progress Notes (Signed)
   Department of Radiation Oncology  Phone:  [phone] Fax:        [phone]   Name: [name] MRN: [id number]  DOB: 03/15/1956  Date: 10/30/2015  Follow Up Visit Note  Diagnosis: Breast cancer of upper-inner quadrant of left female breast [facility])   Staging form: Breast, AJCC 7th Edition     Clinical stage from 12/25/2014: Stage IIB (T3, N0, M0) - Unsigned       Staging comments: Staged at breast conference on 3.23.16      Pathologic stage from 06/11/2015: Stage IIIA (yT3, N1a, cM0) - Unsigned  Summary and Interval since last radiation:   08/04/15 to 09/22/15  Site/dose:   Left chest wall / 50.4 Gray @ 1.8 Pearline Cables per fraction x 28 fractions Left Supraclavicular fossa /PAB 79 Gray @1 .8 Gray per fraction x 25 fractions Left scar / 10 Gray at [employer] per fraction x 5 fractions  Interval History: [name] presents today for routine followup. She is using Radiaplex bid to the treatment area. No follow up with Dr. Burr Medico has been made as of yet. She has not started any anti-hormonal medication. She feels that she has significant left arm stiffness in the morning, this resolves after she moves her arms.  Physical Exam:  Filed Vitals:   10/30/15 1529  BP: 109/72  Pulse: 76  Temp: 97.9 F (36.6 C)  TempSrc: Oral  Height: 5\' 4"  (1.626 m)  Weight: 155 lb (70.308 kg)  SpO2: 99%  Skin is well healed. Minor hyperpigmentation medially.  IMPRESSION: Emily Griffith is a 60 y.o. 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  PLAN: She is doing well. We discussed the need for follow up every 4-6 months which she has scheduled.  We discussed the need for yearly mammograms which she can schedule with her OBGYN or with medical oncology. We discussed the need for sun protection in the treated area. I will see her back in 6 months for follow up.   She has an appointment with surviorship on 12/09/15. I will ask the breast cancer  navigators regarding a follow up with Dr. Burr Medico.  Thea Silversmith, MD  This document serves as a record of services personally performed by Thea Silversmith, MD. It was created on her behalf by Darcus Austin, a trained medical scribe. The creation of this record is based on the scribe's personal observations and the provider's statements to them. This document has been checked and approved by the attending provider.

## 2015-10-31 ENCOUNTER — Telehealth: Payer: Self-pay | Admitting: Hematology

## 2015-10-31 ENCOUNTER — Encounter: Payer: Self-pay | Admitting: Hematology

## 2015-10-31 ENCOUNTER — Encounter: Payer: Self-pay | Admitting: *Deleted

## 2015-10-31 NOTE — Telephone Encounter (Signed)
Lt mess for pt regarding appt on 1/30 at 3:00 arrival time 2:30pm

## 2015-11-03 ENCOUNTER — Encounter: Payer: Self-pay | Admitting: Hematology

## 2015-11-03 ENCOUNTER — Telehealth: Payer: Self-pay | Admitting: Hematology

## 2015-11-03 ENCOUNTER — Ambulatory Visit: Payer: BLUE CROSS/BLUE SHIELD

## 2015-11-03 ENCOUNTER — Encounter: Payer: BLUE CROSS/BLUE SHIELD | Admitting: Hematology

## 2015-11-03 NOTE — Telephone Encounter (Signed)
Added lab to f/u today. Left message for patient re add for lab and new time for 2:45 pm.

## 2015-11-03 NOTE — Progress Notes (Signed)
This encounter was created in error - please disregard.

## 2015-11-04 ENCOUNTER — Telehealth: Payer: Self-pay | Admitting: Hematology

## 2015-11-04 NOTE — Telephone Encounter (Signed)
Spoke to patient and r/s missed appointment 1/30 to 2/2 with Dr. Burr Medico per 1/31 pof.

## 2015-11-06 ENCOUNTER — Ambulatory Visit (HOSPITAL_BASED_OUTPATIENT_CLINIC_OR_DEPARTMENT_OTHER): Payer: BLUE CROSS/BLUE SHIELD | Admitting: Hematology

## 2015-11-06 ENCOUNTER — Ambulatory Visit (HOSPITAL_BASED_OUTPATIENT_CLINIC_OR_DEPARTMENT_OTHER): Payer: BLUE CROSS/BLUE SHIELD

## 2015-11-06 ENCOUNTER — Encounter: Payer: Self-pay | Admitting: Hematology

## 2015-11-06 ENCOUNTER — Telehealth: Payer: Self-pay | Admitting: Hematology

## 2015-11-06 VITALS — BP 117/80 | HR 79 | Temp 98.2°F | Resp 16 | Ht 64.0 in | Wt 158.3 lb

## 2015-11-06 DIAGNOSIS — C50212 Malignant neoplasm of upper-inner quadrant of left female breast: Secondary | ICD-10-CM

## 2015-11-06 DIAGNOSIS — I10 Essential (primary) hypertension: Secondary | ICD-10-CM

## 2015-11-06 LAB — COMPREHENSIVE METABOLIC PANEL
ALT: 17 U/L (ref 0–55)
AST: 15 U/L (ref 5–34)
Albumin: 3.8 g/dL (ref 3.5–5.0)
Alkaline Phosphatase: 77 U/L (ref 40–150)
Anion Gap: 11 mEq/L (ref 3–11)
BUN: 13 mg/dL (ref 7.0–26.0)
CHLORIDE: 101 meq/L (ref 98–109)
CO2: 27 mEq/L (ref 22–29)
Calcium: 9.6 mg/dL (ref 8.4–10.4)
Creatinine: 0.7 mg/dL (ref 0.6–1.1)
EGFR: >90 mL/min/{1.73_m2} (ref >90)
GLUCOSE: 89 mg/dL (ref 70–140)
POTASSIUM: 3.8 meq/L (ref 3.5–5.1)
SODIUM: 140 meq/L (ref 136–145)
Total Bilirubin: 0.86 mg/dL (ref 0.20–1.20)
Total Protein: 7.3 g/dL (ref 6.4–8.3)

## 2015-11-06 LAB — CBC WITH DIFFERENTIAL/PLATELET
BASO%: 0.2 % (ref 0.0–2.0)
BASOS ABS: 0 10*3/uL (ref 0.0–0.1)
EOS ABS: 0.1 10*3/uL (ref 0.0–0.5)
EOS%: 1.8 % (ref 0.0–7.0)
HCT: 39.9 % (ref 34.8–46.6)
HGB: 13.5 g/dL (ref 11.6–15.9)
LYMPH%: 18.2 % (ref 14.0–49.7)
MCH: 30.8 pg (ref 25.1–34.0)
MCHC: 33.8 g/dL (ref 31.5–36.0)
MCV: 90.9 fL (ref 79.5–101.0)
MONO#: 0.3 10*3/uL (ref 0.1–0.9)
MONO%: 7.3 % (ref 0.0–14.0)
NEUT#: 3.3 10*3/uL (ref 1.5–6.5)
NEUT%: 72.5 % (ref 38.4–76.8)
Platelets: 246 10*3/uL (ref 145–400)
RBC: 4.39 10*6/uL (ref 3.70–5.45)
RDW: 14.9 % — AB (ref 11.2–14.5)
WBC: 4.5 10*3/uL (ref 3.9–10.3)
lymph#: 0.8 10*3/uL — ABNORMAL LOW (ref 0.9–3.3)

## 2015-11-06 MED ORDER — LETROZOLE 2.5 MG PO TABS: 2.5000 mg | ORAL_TABLET | Freq: Every day | ORAL | Status: DC

## 2015-11-06 NOTE — Telephone Encounter (Signed)
per pof to sch pt appt-per [place] pt back to lab-pt has MY CHART for next visit appt

## 2015-11-06 NOTE — Progress Notes (Signed)
[facility]  [phone] [phone]  Clinic follow-up Note   Patient Care Team: Midge Minium, MD as PCP - General (Family Medicine) Fanny Skates, MD as Consulting Physician (General Surgery) Truitt Merle, MD as Consulting Physician (Hematology) Thea Silversmith, MD as Consulting Physician (Radiation Oncology) Rockwell Germany, RN as Registered Nurse Mauro Kaufmann, RN as Registered Nurse Holley Bouche, NP as Nurse Practitioner (Nurse Practitioner)   CHIEF COMPLAINTS:  Follow up breast cancer  Oncology History   [id number] cancer of upper-inner quadrant of left female breast   Staging form: Breast, AJCC 7th Edition     Clinical stage from 12/25/2014: Stage IIB (T3, N0, M0) - Unsigned       Staging comments: Staged at breast conference on 3.23.16      Pathologic stage from 06/11/2015: Stage IIIA (yT3, N1a, cM0) - Unsigned             Breast cancer of upper-inner quadrant of left female breast ([place])   12/09/2014 Breast US ultrasound is performed, showing a diffuse, large, irregular, hypoechoic mass with shadowing located within the superior left breast extending from the 9:30 o'clock position to approximately the 2:30 o'clock position and centered at approximately the    12/13/2014 Oncotype testing RS 27, predicted recurrent risk of 18% with tamoxifen alone   12/13/2014 Pathology Results Breast, left, needle core biopsy, 11:30 o'clock - INVASIVE MAMMARY CARCINOMA, SEE COMMENT. - MAMMARY CARCINOMA IN SITU. 2. Breast, left, needle core biopsy, 9:30 o'clock - INVASIVE MAMMARY CARCINOMA, SEE COMMENT   12/13/2014 Receptors her2 Estrogen Receptor: 99%, POSITIVE, STRONG STAINING INTENSITY Progesterone Receptor: 43%, POSITIVE, MODERATE STAINING INTENSITY Proliferation Marker Ki67: 42%  HER-2/NEU BY CISH - NEGATIVE   12/20/2014 Initial Diagnosis Breast cancer of upper-inner quadrant of left female breast   12/23/2014 Breast MRI there is extensive  abnormal enhancement throughout much of the left breast suspicious for additional areas of malignancy. Overall area of this abnormal enhancement covers 8.5 cm x 7 cm x 7.1 cm. Most discrete area of abnormal enhancement lies in the up   01/24/2015 - 05/09/2015 Chemotherapy docetaxel 65m/m2, cyclophosphamide 600 mg/m, on day 1 every 21 days.   06/11/2015 Surgery Left breast simple mastectomy and sentinel lymph node biopsy. Surgical margins were negative.   06/11/2015 Pathology Results Left breast mastectomy showed residual scattered tumor cells consistent with invasive grade 1 lobular carcinoma involving approximately 5.9 cm worse of breast parenchyma, atypical lobular hyperplasia, 1 out of 4 axillary lymph nodes were positive.    07/11/2015 Imaging PET scan showed a  hypermetabolic lesion in the posterior aspect of the lateral segment left liver. low-level diffuse FDG uptake in the anterior left chest wall , likely secondary to mastectomy.    07/17/2015 Imaging  liver MRI with and without contrast showed tiny simple cyst, no suspicious liver masses.    08/04/2015 - 09/22/2015 Radiation Therapy radiation to left chest wall and Supraclavicular fossa     HISTORY OF PRESENTING ILLNESS:  Emily Graves60y.o. female is here because of newly diagnosed left breast cancer.  This was found by screening mammogram. Her prior screening mammogram was in November 2014 which was negative. Her screening mammogram on 11/25/2014 showed a possible mass and distortion in the left breast. She further underwent diagnostic mammogram and ultrasound on 12/09/2014, which showed a diffuse large irregular hypoechogenic mass extending from 9:30 o'clock position 2-30 o'clock position. Biopsy of this large mass at 2 different positions showed invasive lobular carcinoma.  She  denies any symptoms. She feels well overall. She denies any pain, cough, dyspnea, or any GI symptoms. 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 No recent weight loss. No change of her appetite.   She had right breast biopsy was benign. She always has lumpy breast which has not changed per patient. She has IBS, she has dirrhea which is usually triggered by stress, and she takes Imodium as needed.    CURRENT THERAPY: pending adjuvant endocrine therapy   INTERIM HISTORY: [name] returns for follow-up. She is accompanied by her husband to the clinic today. She completed breast radiation on 09/22/2015. Her follow-up appointment with me was not scheduled in a timely manner, and she is here to discuss adjuvant endocrine therapy. She tolerated radiation very well, no significant side effects except mild skin dermatitis. She feels well, denies any complaints.   MEDICAL HISTORY:  Past Medical History  Diagnosis Date  . Chicken pox   . Hypertension   . Hyperlipidemia   . IBS (irritable bowel syndrome)   . Breast cancer of upper-inner quadrant of left female breast ([place]) 12/20/2014  . History of hiatal hernia   . Breast cancer [facility])     SURGICAL HISTORY: Past Surgical History  Procedure Laterality Date  . Laser ablation of the cervix  1991  . Colonoscopy  06/18/2005  . Mastectomy w/ sentinel node biopsy Left 06/11/2015  . Simple mastectomy with axillary sentinel node biopsy Left 06/11/2015    Procedure: LEFT TOTAL MASTECTOMY WITH LEFT SENTINEL NODE BIOPSY;  Surgeon: Fanny Skates, MD;  Location: [place];  Service: General;  Laterality: Left;    SOCIAL HISTORY: History   Social History  . Marital Status: Married    Spouse Name: N/A  . Number of Children: 0  . Years of Education: N/A   Occupational History  . 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 Smoking status: Never Smoker   . Smokeless tobacco: Not on file  . Alcohol Use: Yes     Comment: social   . Drug Use: No  . Sexual Activity: Not on file   Other Topics Concern  . Not on file   Social History Narrative   GYN  HISTORY  Menarchal: 11 LMP: 03/2013  Contraceptive: no HRT: no  G0P0:    FAMILY HISTORY: Family History  Problem Relation Age of Onset  . Hypertension Mother   . Sudden death Father 30    bleeding   . Cancer Maternal Uncle 60    unknown cancer   . Cancer Paternal Uncle 24    unknown cancer     ALLERGIES:  is allergic to lactose intolerance (gi).  MEDICATIONS:  Current Outpatient Prescriptions  Medication Sig Dispense Refill  . atorvastatin (LIPITOR) 40 MG tablet TAKE 1 TABLET EVERY NIGHT AT BEDTIME 90 tablet 1  . Cholecalciferol (VITAMIN D) 2000 UNITS CAPS Take 2,000 Units by mouth daily.    Marland Kitchen dicyclomine (BENTYL) 20 MG tablet TAKE 1 TABLET BY MOUTH EVERY 6 HOURS 60 tablet 6  . lisinopril (PRINIVIL,ZESTRIL) 20 MG tablet Take 20 mg by mouth daily.     . potassium chloride SA (K-DUR,KLOR-CON) 20 MEQ tablet TAKE 1 TABLET DAILY 90 tablet 1  . pantoprazole (PROTONIX) 40 MG tablet Take 40 mg by mouth daily. Reported on 11/06/2015     No current facility-administered medications for this visit.    REVIEW OF SYSTEMS:   Constitutional:  Denies fevers, chills or abnormal night sweats Eyes: Denies blurriness of vision, double vision or watery eyes Ears, nose, mouth, throat, and face: Denies mucositis or sore throat Respiratory: Denies cough, dyspnea or wheezes Cardiovascular: Denies palpitation, chest discomfort or lower extremity swelling Gastrointestinal:  Denies nausea, heartburn or change in bowel habits Skin: Denies abnormal skin rashes Lymphatics: Denies new lymphadenopathy or easy bruising Neurological:Denies numbness, tingling or new weaknesses Behavioral/Psych: Mood is stable, no new changes  All other systems were reviewed with the patient and are negative.  PHYSICAL EXAMINATION: ECOG PERFORMANCE STATUS: 0 - Asymptomatic  Filed Vitals:   11/06/15 1403  BP: 117/80  Pulse: 79  Temp: 98.2 F (36.8 C)  Resp: 16   Filed Weights   11/06/15 1403  Weight: 158 lb 4.8  oz (71.804 kg)    GENERAL:alert, no distress and comfortable SKIN: skin color, texture, turgor are normal, no rashes or significant lesions EYES: normal, conjunctiva are pink and non-injected, sclera clear OROPHARYNX:no exudate, no erythema and lips, buccal mucosa, and tongue normal  NECK: supple, thyroid normal size, non-tender, without nodularity LYMPH:  no palpable lymphadenopathy in the cervical, axillary or inguinal LUNGS: clear to auscultation and percussion with normal breathing effort HEART: regular rate & rhythm and no murmurs and no lower extremity edema ABDOMEN:abdomen soft, non-tender and normal bowel sounds Musculoskeletal:no cyanosis of digits and no clubbing  PSYCH: alert & oriented x 3 with fluent speech NEURO: no focal motor/sensory deficits Breasts: Breast inspection showed them to be symmetrical with no nipple discharge. Left breast is surgically absent, mild diffuse skin pigmentation on the left frontal chest wall due to radiation. Palpitation of the right breast and axilla revealed no obvious mass that I could appreciate.   LABORATORY DATA:  I have reviewed the data as listed CBC Latest Ref Rng 11/06/2015 06/11/2015 05/30/2015  WBC 3.9 - 10.3 10e3/uL 4.5 8.8 4.4  Hemoglobin 11.6 - 15.9 g/dL 13.5 10.2(L) 10.6(L)  Hematocrit 34.8 - 46.6 % 39.9 32.7(L) 31.9(L)  Platelets 145 - 400 10e3/uL 246 244 286    CMP Latest Ref Rng 11/06/2015 07/17/2015 06/11/2015  Glucose 70 - 140 mg/dl 89 - -  BUN 7.0 - 26.0 mg/dL 13.0 - -  Creatinine 0.6 - 1.1 mg/dL 0.7 0.59 0.59  Sodium 136 - 145 mEq/L 140 - -  Potassium 3.5 - 5.1 mEq/L 3.8 - -  Chloride 98 - 110 mmol/L - - -  CO2 22 - 29 mEq/L 27 - -  Calcium 8.4 - 10.4 mg/dL 9.6 - -  Total Protein 6.4 - 8.3 g/dL 7.3 - -  Total Bilirubin 0.20 - 1.20 mg/dL 0.86 - -  Alkaline Phos 40 - 150 U/L 77 - -  AST 5 - 34 U/L 15 - -  ALT 0 - 55 U/L 17 - -    PATHOLOGY REPORT 12/13/2014 Diagnosis 06/11/2015 1. Breast, simple mastectomy, Left -  RESIDUAL SCATTERED TUMOR CELLS CONSISTENT WITH INVASIVE GRADE I LOBULAR CARCINOMA INVOLVING APPROXIMATELY 5.9 CM WORTH OF BREAST PARENCHYMA. - LOBULAR NEOPLASIA (ATYPICAL LOBULAR HYPERPLASIA). - SCATTERED CALCIFICATIONS PRESENT. - MARGINS ARE NEGATIVE. - ONE BENIGN LYMPH NODE WITH NO TUMOR SEEN (0/1). - SEE ONCOLOGY TEMPLATE. 2. Lymph node, sentinel, biopsy, Left axillary #1 - ONE BENIGN LYMPH NODE WITH NO TUMOR SEEN (0/1). 3. Lymph node, sentinel, biopsy, Left axillary #2 - ONE BENIGN LYMPH NODE WITH NO TUMOR SEEN (0/1). 4. Lymph node, sentinel, biopsy, Left axillary #3 - ONE LYMPH NODE POSITIVE FOR METASTATIC LOBULAR CARCINOMA (1/1).  Microscopic Comment 1.  BREAST, INVASIVE TUMOR, WITH LYMPH NODES PRESENT Specimen, including laterality and lymph node sampling (sentinel, non-sentinel): Left breast with sentinel lymph node sampling. Procedure: Left mastectomy with left sentinel lymph node biopsies. Histologic type: Invasive lobular carcinoma. Grade: I. Tubule formation: 3. Nuclear pleomorphism: 1. Mitotic: 1. Tumor size: (based on gross measurement) Scattered tumor cells involve approximately 5.9 cm worth of breast parenchyma. Margins: Invasive, distance to closest margin: At least 0.4 cm (all margins). In-situ, distance to closest margin: At least 0.4 cm (all margin). Lymphovascular invasion: Definitive lymph/vascular invasion is not identified, however tumor is present within a lymph node tissue, see below. Ductal carcinoma in situ: Not identified. Lobular neoplasia: Yes, atypical lobular hyperplasia is present. Tumor focality: Unifocal. Treatment effect: Extensive therapy related changes are present throughout the tumor mass with only scattered residual tumor cells present. The scattered cells involve a 5.9 cm (approximate measurement) area of indurated ill defined soft tissue. Significant treatment effect is not identified within the lymph nodes. Extent of tumor: Skin: Not  involved. Nipple: Tumor involves nipple ducts underlying the surface. Skeletal muscle: Not received. Lymph nodes: Examined: 3 Sentinel. 1 Non-sentinel. 4 Total. Lymph nodes with metastasis: 1. Isolated tumor cells (< 0.2 mm): 0. Micrometastasis: (> 0.2 mm and < 2.0 mm): 0. Macrometastasis: (> 2.0 mm): 1. Extracapsular extension: Not identified. Breast prognostic profile: Performed on previous case [id number] 1) Left 11:30 o'clock biopsy ([id number], part 1). Estrogen receptor: 99% positive. Progesterone receptor: 43% positive. Her 2 neu: 1.84 ratio, negative. Ki-67: 42%. 2) Left 9:30 biopsy ([id number], part 2) Estrogen receptor: 99%, positive. Progesterone receptor: 71%, positive. Her 2 neu: 1.80 ratio, negative. Ki-67: 33%. Additional breast findings: Fibrocystic changes with focal usual ducal hyperplasia; benign ducts with calcifications; stromal calcifications present; neoadjuvant changes. TNM: ypT3, ypN1a. Comments: A cytokeratin AE1/AE3 immunostain is performed on all lymph node tissue (5 stains total). The staining pattern is compatible with the above findings. Dr. Lyndon Code is in agreement that the tumor is best staged as a ypT3 tumor. As Her-2 neu was previously negative, this will be repeated on a representative slide with tumor and reported in an addendum to follow. (RH;kh 06-12-15)   1. FLUORESCENCE IN-SITU HYBRIDIZATION Results: HER2 - NEGATIVE RATIO OF HER2/CEP17 SIGNALS 1.74 AVERAGE HER2 COPY NUMBER PER CELL 3.30  RADIOGRAPHIC STUDIES: I have personally reviewed the radiological images as listed and agreed with the findings in the report.  Breast MRI 05/17/2059 IMPRESSION: 1. Marked interval decrease in the previously noted extensive enhancement throughout the left breast, compatible with response to treatment. No evidence of malignancy within the right breast. 2. Nonspecific focus of skin enhancement along the medial aspect of the left breast. 3.  Incidentally noted hiatal hernia with possible asymmetric wall thickening.   ASSESSMENT & PLAN:  59 yo female, with PMH of  Hypertension and a mild IBS , otherwise very fit and healthy postmenopausal woman , who was found to have left breast cancer by screening mammogram.  1. CT3N0M0,  Stage IIB,  Invasive lobular carcinoma,  Strongly ER and PR positive, HER-2 negative, Ki67 33-42%, and lobular carcinoma in situ, ypT3N1aMx, pathological stage IIIA after neoadjuvant chemo  -She received neoadjuvant 6 cycle of TC, followed by left mastectomy and sentinel lymph node biopsy, surgical margins were negative. -I discussed her surgical pathology findings with patient and her husband in great details. 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 -Her surgical pathology  findings were discussed in the tumor board, Dr. Dalbert Batman recommends against axillary lymph node dissection, and  she received adjuvant radiation -Her PET scan was negative for distant metastasis, this was reviewed with patient and her husband.   -Giving the strong ER/PR positivity, I would recommend adjuvant endocrine therapy with aromatase inhibitor, likely 10 years due to her advanced stage and high risk of recurrence. Potential side effects, which includes but not limited to, hot flash, mood sewing, skiing and vaginal dryness, osteoporosis, slightly increased risk of cardiovascular disease, arthralgia, etc. were discussed with her in great details. She voiced good understanding and agrees to proceed. -I sent a prescription of letrozole to her pharmacy today, she will start in a few days. -I discussed the open phase 3 clinical trial with her: [facility] clinical trial counseling: Patients who have completed definitive therapy for breast cancer are randomized to antiestrogen therapy (5+ years) versus antiestrogen  therapy plus Palbociclib (2 years). Palbociclib: If she was randomized to Palbociclib, I discussed the risks and benefits of Ibrance including myelosuppression especially neutropenia and with that risk of infection, there is risk of pulmonary embolism and mild peripheral neuropathy as well. Fatigue, nausea, diarrhea, decreased appetite as well as alopecia and thrombocytopenia are also potential side effects of Palbociclib. -I give her the consent form of the above clinical trial, she will read about it and let me know in a few weeks. -We discussed the surveillance plan after her breast radiation. She'll continue annual screening mammogram, I encouraged her to do self exam, and I'll see her with labs every 3-4 months for the first 3 years, then every 6-12 months afterwards.  2.  Hypertension - continue medication. Follow-up of his primary care physician  3. Anemia -Secondary to chemotherapy, resolved now   Plan: -Lab today  -She will start letrozole in a few days. Prescription was sent to her pharmacy today -She will think about the clinical trial [facility] and let me know if she is interested in a few weeks. Trial consent form was given to her today   Patient and her husband had multiple is she taking long I answered to their satisfaction. The patient knows to call the clinic with any problems, questions or concerns.  I spent 30 minutes counseling the patient face to face. The total time spent in the appointment was 40 minutes and more than 50% was on counseling.     Truitt Merle, MD 11/06/2015   2:19 PM

## 2015-11-13 ENCOUNTER — Encounter: Payer: Self-pay | Admitting: *Deleted

## 2015-11-24 ENCOUNTER — Ambulatory Visit (INDEPENDENT_AMBULATORY_CARE_PROVIDER_SITE_OTHER): Payer: BLUE CROSS/BLUE SHIELD | Admitting: Family Medicine

## 2015-11-24 ENCOUNTER — Encounter: Payer: Self-pay | Admitting: Family Medicine

## 2015-11-24 VITALS — BP 120/88 | HR 84 | Temp 98.4°F | Ht 64.0 in | Wt 157.6 lb

## 2015-11-24 DIAGNOSIS — Z1211 Encounter for screening for malignant neoplasm of colon: Secondary | ICD-10-CM

## 2015-11-24 DIAGNOSIS — Z0000 Encounter for general adult medical examination without abnormal findings: Secondary | ICD-10-CM | POA: Diagnosis not present

## 2015-11-24 NOTE — Progress Notes (Signed)
Pre visit review using our clinic review tool, if applicable. No additional management support is needed unless otherwise documented below in the visit note. 

## 2015-11-24 NOTE — Progress Notes (Signed)
   Subjective:    Patient ID: Emily Griffith, female    DOB: 05-May-1956, 60 y.o.   MRN: [id number]  HPI CPE- UTD on mammo, pap.  Due for colonoscopy- was schedule last year but then dx'd w/ breast cancer.   Review of Systems Patient reports no vision/ hearing changes, adenopathy,fever, weight change,  persistant/recurrent hoarseness , swallowing issues, chest pain, palpitations, edema, persistant/recurrent cough, hemoptysis, dyspnea (rest/exertional/paroxysmal nocturnal), gastrointestinal bleeding (melena, rectal bleeding), abdominal pain, significant heartburn, bowel changes, GU symptoms (dysuria, hematuria, incontinence), Gyn symptoms (abnormal  bleeding, pain),  syncope, focal weakness, memory loss, numbness & tingling, skin/nail changes, abnormal bruising or bleeding, anxiety, or depression.   + hair loss from chemo    Objective:   Physical Exam General Appearance:    Alert, cooperative, no distress, appears stated age  Head:    Normocephalic, without obvious abnormality, atraumatic  Eyes:    PERRL, conjunctiva/corneas clear, EOM's intact, fundi    benign, both eyes  Ears:    Normal TM's and external ear canals, both ears  Nose:   Nares normal, septum midline, mucosa normal, no drainage    or sinus tenderness  Throat:   Lips, mucosa, and tongue normal; teeth and gums normal  Neck:   Supple, symmetrical, trachea midline, no adenopathy;    Thyroid: no enlargement/tenderness/nodules  Back:     Symmetric, no curvature, ROM normal, no CVA tenderness  Lungs:     Clear to auscultation bilaterally, respirations unlabored  Chest Wall:    No tenderness or deformity   Heart:    Regular rate and rhythm, S1 and S2 normal, no murmur, rub   or gallop  Breast Exam:    Deferred to onc  Abdomen:     Soft, non-tender, bowel sounds active all four quadrants,    no masses, no organomegaly  Genitalia:    Deferred  Rectal:    Extremities:   Extremities normal, atraumatic, no cyanosis or edema    Pulses:   2+ and symmetric all extremities  Skin:   Skin color, texture, turgor normal, no rashes or lesions  Lymph nodes:   Cervical, supraclavicular, and axillary nodes normal  Neurologic:   CNII-XII intact, normal strength, sensation and reflexes    throughout          Assessment & Plan:

## 2015-11-24 NOTE — Patient Instructions (Signed)
Follow up in 6 months to recheck BP and cholesterol We'll notify you of your lab results and make any changes if needed Keep up the good work on healthy diet and regular exercise- you look great! Call with any questions or concerns If you want to join [place] at the new [place] office, any scheduled appointments will automatically transfer and we will see you at [address] [place] [address], [place] [phone] ([place]) Have a great spring!!!

## 2015-11-25 LAB — BASIC METABOLIC PANEL
BUN: 11 mg/dL (ref 6–23)
CALCIUM: 10.1 mg/dL (ref 8.4–10.5)
CO2: 30 meq/L (ref 19–32)
CREATININE: 0.58 mg/dL (ref 0.40–1.20)
Chloride: 101 mEq/L (ref 96–112)
GFR: 112.3 mL/min (ref >60.00)
Glucose, Bld: 85 mg/dL (ref 70–99)
Potassium: 4 mEq/L (ref 3.5–5.1)
Sodium: 139 mEq/L (ref 135–145)

## 2015-11-25 LAB — LIPID PANEL
CHOL/HDL RATIO: 3
Cholesterol: 213 mg/dL — ABNORMAL HIGH (ref 0–200)
HDL: 68.7 mg/dL (ref >39.00)
LDL Cholesterol: 128 mg/dL — ABNORMAL HIGH (ref 0–99)
NONHDL: 143.89
TRIGLYCERIDES: 77 mg/dL (ref 0.0–149.0)
VLDL: 15.4 mg/dL (ref 0.0–40.0)

## 2015-11-25 LAB — CBC WITH DIFFERENTIAL/PLATELET
BASOS ABS: 0 10*3/uL (ref 0.0–0.1)
BASOS PCT: 0.4 % (ref 0.0–3.0)
EOS ABS: 0.1 10*3/uL (ref 0.0–0.7)
Eosinophils Relative: 1.6 % (ref 0.0–5.0)
HEMATOCRIT: 41.6 % (ref 36.0–46.0)
HEMOGLOBIN: 14.1 g/dL (ref 12.0–15.0)
LYMPHS PCT: 14.6 % (ref 12.0–46.0)
Lymphs Abs: 0.9 10*3/uL (ref 0.7–4.0)
MCHC: 33.9 g/dL (ref 30.0–36.0)
MCV: 90.9 fl (ref 78.0–100.0)
MONO ABS: 0.3 10*3/uL (ref 0.1–1.0)
Monocytes Relative: 5.6 % (ref 3.0–12.0)
Neutro Abs: 4.7 10*3/uL (ref 1.4–7.7)
Neutrophils Relative %: 77.8 % — ABNORMAL HIGH (ref 43.0–77.0)
Platelets: 296 10*3/uL (ref 150.0–400.0)
RBC: 4.58 Mil/uL (ref 3.87–5.11)
RDW: 15 % (ref 11.5–15.5)
WBC: 6 10*3/uL (ref 4.0–10.5)

## 2015-11-25 LAB — HEPATIC FUNCTION PANEL
ALBUMIN: 4.4 g/dL (ref 3.5–5.2)
ALT: 16 U/L (ref 0–35)
AST: 15 U/L (ref 0–37)
Alkaline Phosphatase: 68 U/L (ref 39–117)
BILIRUBIN TOTAL: 0.9 mg/dL (ref 0.2–1.2)
Bilirubin, Direct: 0.1 mg/dL (ref 0.0–0.3)
Total Protein: 7.4 g/dL (ref 6.0–8.3)

## 2015-11-25 LAB — TSH: TSH: 1.04 u[IU]/mL (ref 0.35–4.50)

## 2015-11-25 LAB — VITAMIN D 25 HYDROXY (VIT D DEFICIENCY, FRACTURES): VITD: 27.56 ng/mL — AB (ref 30.00–100.00)

## 2015-11-25 NOTE — Assessment & Plan Note (Signed)
Pt's PE WNL.  Due for colonoscopy- referral placed.  Check labs.  Anticipatory guidance provided.  

## 2015-11-26 ENCOUNTER — Encounter: Payer: Self-pay | Admitting: Family Medicine

## 2015-11-26 ENCOUNTER — Other Ambulatory Visit: Payer: Self-pay | Admitting: General Practice

## 2015-11-26 MED ORDER — VITAMIN D (ERGOCALCIFEROL) 1.25 MG (50000 UNIT) PO CAPS: 50000.0000 [IU] | ORAL_CAPSULE | ORAL | Status: DC

## 2015-12-04 ENCOUNTER — Other Ambulatory Visit: Payer: Self-pay

## 2015-12-04 DIAGNOSIS — Z1231 Encounter for screening mammogram for malignant neoplasm of breast: Secondary | ICD-10-CM

## 2015-12-08 ENCOUNTER — Encounter: Payer: Self-pay | Admitting: Family Medicine

## 2015-12-09 ENCOUNTER — Ambulatory Visit (HOSPITAL_BASED_OUTPATIENT_CLINIC_OR_DEPARTMENT_OTHER): Payer: BLUE CROSS/BLUE SHIELD | Admitting: Nurse Practitioner

## 2015-12-09 VITALS — BP 132/84 | HR 79 | Temp 98.8°F | Resp 18 | Ht 64.0 in | Wt 159.1 lb

## 2015-12-09 DIAGNOSIS — C50212 Malignant neoplasm of upper-inner quadrant of left female breast: Secondary | ICD-10-CM

## 2015-12-10 ENCOUNTER — Encounter: Payer: Self-pay | Admitting: Nurse Practitioner

## 2015-12-10 NOTE — Progress Notes (Signed)
CLINIC:  Cancer Survivorship   REASON FOR VISIT:  Routine follow-up post-treatment for a recent history of breast cancer.  BRIEF ONCOLOGIC HISTORY:  Oncology History   Breast cancer of upper-inner quadrant of left female breast   Staging form: Breast, AJCC 7th Edition     Clinical stage from 12/25/2014: Stage IIB (T3, N0, M0) - Unsigned       Staging comments: Staged at breast conference on 3.23.16      Pathologic stage from 06/11/2015: Stage IIIA (yT3, N1a, cM0) - Unsigned             Breast cancer of upper-inner quadrant of left female breast ([place])   12/09/2014 Breast US Left breast: diffuse, large, irregular, hypoechoic mass with shadowing located superiorally extending from the 9:30 o'clock position to approximately the 2:30 o'clock position   12/13/2014 Initial Biopsy Left breast core needle bx (11:30): invasive mammary carcinoma, MCIS; left breast bx (9:30): invasive mammary carcinoma.  ER+ (99%), PR+ (43%), HER2/neu negative, Ki67 42%.   12/13/2014 Oncotype testing RS 27, predicted recurrent risk of 18% with tamoxifen alone   12/23/2014 Breast MRI Extensive abnormal enhancement throughout much of the left breast suspicious for additional areas of malignancy. Overall area of this abnormal enhancement covers 8.5 cm x 7 cm x 7.1 cm. Most discrete area of abnormal enhancement lies in the up   12/25/2014 Clinical Stage Stage IIIB: T3 N0   01/24/2015 - 05/09/2015 Neo-Adjuvant Chemotherapy Docetaxel and cyclophosphamide q 3weeks   05/17/2015 Breast MRI Marked interval decrease in the previously noted extensive enhancement throughout the left breast, compatible with response to treatment. No evidence of malignancy within the right breast.   06/11/2015 Surgery Left breast simple mastectomy and sentinel lymph node biopsy. Surgical margins were negative.   06/11/2015 Pathology Results Left breast mastectomy showed residual scattered tumor cells consistent with invasive grade 1 lobular carcinoma  involving approximately 5.9 cm worse of breast parenchyma, atypical lobular hyperplasia, 1 out of 4 axillary lymph nodes were positive.    06/11/2015 Pathologic Stage Stage IIIA: ypT3 ypN1a   07/11/2015 Imaging PET scan showed a  hypermetabolic lesion in the posterior aspect of the lateral segment left liver. low-level diffuse FDG uptake in the anterior left chest wall , likely secondary to mastectomy.    07/17/2015 Imaging  Liver MRI with and without contrast showed tiny simple cyst, no suspicious liver masses.    08/04/2015 - 09/22/2015 Radiation Therapy Adjuvant RT: Left chest wall / 50.4 Gray @ 1.8 Gray per fraction x 28 fractions; left supraclavicular fossa /PAB 45 Gray @1 .8 Gray per fraction x 25 fractions; left scar / 10 Gray at [employer] per fraction x 5 fractions   11/06/2015 -  Anti-estrogen oral therapy Letrozole 2.5 mg daily. Planned duration of therapy 10 years    INTERVAL HISTORY:  [name] presents to the [facility] today for our initial meeting to review her survivorship care plan detailing her treatment course for breast cancer, as well as monitoring long-term side effects of that treatment, education regarding health maintenance, screening, and overall wellness and health promotion.     Overall, [name] reports feeling quite well since completing her radiation therapy approximately two and a half months ago.  She has remained very active working full time and golfing in her spare time, which has kept her energy up. She denies fatigue.  She has had no pain or changes in her breast.  She denies headache, cough, shortness of breath or bone pain.  She  has a good appetite and denies weight loss.  She has begun the letrozole and thus far, is tolerating it well without significant hot flashes, vaginal dryness or joint pain.    REVIEW OF SYSTEMS:  General: Denies fever, chills, unintentional weight loss, or generalized fatigue.  HEENT: Denies visual changes, hearing loss, mouth  sores or difficulty swallowing. Cardiac: Denies palpitations, chest pain, and lower extremity edema.  Respiratory: Denies wheeze or dyspnea on exertion.  Breast: Denies any new nodularity, masses, tenderness, nipple changes, or nipple discharge.  GI: Denies abdominal pain, constipation, diarrhea, nausea, or vomiting.  GU: Denies dysuria, hematuria, vaginal bleeding, vaginal discharge, or vaginal dryness.  Musculoskeletal: Denies joint or bone pain.  Neuro: Denies recent fall or numbness / tingling in her extremities. Skin: Denies rash, pruritis, or open wounds.  Psych: Denies depression, anxiety, insomnia, or memory loss.   A 14-point review of systems was completed and was negative, except as noted above.   ONCOLOGY TREATMENT TEAM:  1. Surgeon:  Dr. Dalbert Batman at [facility]  2. Medical Oncologist: Dr. Burr Medico 3. Radiation Oncologist: Dr. Pablo Ledger    PAST MEDICAL/SURGICAL HISTORY:  Past Medical History  Diagnosis Date  . Chicken pox   . Hypertension   . Hyperlipidemia   . IBS (irritable bowel syndrome)   . Breast cancer of upper-inner quadrant of left female breast ([place]) 12/20/2014  . History of hiatal hernia   . Breast cancer [facility])    Past Surgical History  Procedure Laterality Date  . Laser ablation of the cervix  1991  . Colonoscopy  06/18/2005  . Mastectomy w/ sentinel node biopsy Left 06/11/2015  . Simple mastectomy with axillary sentinel node biopsy Left 06/11/2015    Procedure: LEFT TOTAL MASTECTOMY WITH LEFT SENTINEL NODE BIOPSY;  Surgeon: Fanny Skates, MD;  Location: [facility];  Service: General;  Laterality: Left;     ALLERGIES:  Allergies  Allergen Reactions  . Lactose Intolerance (Gi)      CURRENT MEDICATIONS:  Current Outpatient Prescriptions on File Prior to Visit  Medication Sig Dispense Refill  . atorvastatin (LIPITOR) 40 MG tablet TAKE 1 TABLET EVERY NIGHT AT BEDTIME 90 tablet 1  . Cholecalciferol (VITAMIN D) 2000 UNITS CAPS Take 2,000 Units by  mouth daily.     Marland Kitchen letrozole (FEMARA) 2.5 MG tablet Take 1 tablet (2.5 mg total) by mouth daily. 30 tablet 1  . lisinopril (PRINIVIL,ZESTRIL) 20 MG tablet Take 20 mg by mouth daily.     . potassium chloride SA (K-DUR,KLOR-CON) 20 MEQ tablet TAKE 1 TABLET DAILY 90 tablet 1  . Vitamin D, Ergocalciferol, (DRISDOL) 50000 units CAPS capsule Take 1 capsule (50,000 Units total) by mouth every 7 (seven) days. 12 capsule 0  . dicyclomine (BENTYL) 20 MG tablet TAKE 1 TABLET BY MOUTH EVERY 6 HOURS (Patient not taking: Reported on 12/09/2015) 60 tablet 6   No current facility-administered medications on file prior to visit.     ONCOLOGIC FAMILY HISTORY:  Family History  Problem Relation Age of Onset  . Hypertension Mother   . Sudden death Father 30    bleeding   . Cancer Maternal Uncle 60    unknown cancer   . Cancer Paternal Uncle 22    unknown cancer      GENETIC COUNSELING/TESTING: No    SOCIAL HISTORY:  BETSABE IGLESIA is married and lives with her spouse in [place], [place].  She has no children. 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  She denies any current or history of tobacco, alcohol, or illicit drug use.   PHYSICAL EXAMINATION:  Vital Signs: Filed Vitals:   12/09/15 1517  BP: 132/84  Pulse: 79  Temp: 98.8 F (37.1 C)  Resp: 18   ECOG Performance Status: 0  General: Well-nourished, well-appearing female in no acute distress.  She is unaccompanied in clinic today.   HEENT: Head is atraumatic and normocephalic.  Pupils equal and reactive to light and accomodation. Conjunctivae clear without exudate.  Sclerae anicteric. Oral mucosa is pink, moist, and intact without lesions.  Oropharynx is pink without lesions or erythema.  Lymph: No cervical, supraclavicular, infraclavicular, or axillary lymphadenopathy noted on palpation.  Cardiovascular: Regular rate and rhythm without murmurs, rubs, or gallops. Respiratory:  Clear to auscultation bilaterally. Chest expansion symmetric without accessory muscle use on inspiration or expiration.  GI: Abdomen soft and round. No tenderness to palpation. Bowel sounds normoactive in 4 quadrants. GU: Deferred. Neuro: No focal deficits. Steady gait.  Psych: Mood and affect normal and appropriate for situation.  Extremities: No edema, cyanosis, or clubbing.  Skin: Warm and dry. No open lesions noted.   LABORATORY DATA:  None for this visit.  DIAGNOSTIC IMAGING:  None for this visit.     ASSESSMENT AND PLAN:   1. 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   Ms. Cumpian is doing well without clinical symptoms worrisome for disease recurrence. She will follow-up with her medical oncologist,  Dr. Burr Medico next week with history and physical examination per surveillance protocol.  She will continue her anti-estrogen therapy at this time and notify us if she develops any new or increased side effects of the medication. A comprehensive survivorship care plan and treatment summary was reviewed with the patient today detailing her breast cancer diagnosis, treatment course, potential late/long-term effects of treatment, appropriate follow-up care with recommendations for the future, and patient education resources.  A copy of this summary, along with a letter will be sent to the patient's primary care provider via in basket message after today's visit.  Ms. Wyche is welcome to return to the Survivorship Clinic in the future, as needed; no  follow-up will be scheduled at this time.    2. Bone health:  Given Ms. Ziemba age/history of breast cancer and her current treatment regimen including endocrine therapy with letrozole, she is at risk for bone demineralization.  Per Ms. Beutler, she is not certain that she has had a DEXA scan in the past and will discuss this with Dr. Burr Medico next week.   In the meantime, she was encouraged to increase her consumption of foods rich in calcium and vitamin D as well as to increase her weight-bearing activities.  She was given education on specific activities to promote bone health.  3. Cancer screening:  Due to Ms. Leclere history and her age, she should receive screening for skin cancers, colon cancer, and gynecologic cancers.  The information and recommendations are listed on the patient's comprehensive care plan/treatment summary and were reviewed in detail with the patient.    4. Health maintenance and wellness promotion: Ms. Rabe was encouraged  to consume 5-7 servings of fruits and vegetables per day. We reviewed the "Nutrition Rainbow" handout, as well as discussed recommendations to maximize nutrition and minimize recurrence, such as increased intake of fruits, vegetables, lean proteins, and minimizing the intake of red meats and processed foods.  She was also encouraged to continue to engage in moderate to vigorous exercise for 30 minutes per day most days of the week. 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  We discussed the [employer] fitness program, which is designed for cancer survivors to help them become more physically fit after cancer treatments.  She was instructed to limit her alcohol consumption and continue to abstain from tobacco use.  A copy of the "Take Control of Your Health" brochure was given to her reinforcing these recommendations.   5. Support services/counseling: It is not uncommon for this period of the patient's cancer care trajectory to be one of many emotions and  stressors.  We discussed an opportunity for her to participate in the next session of [facility] ("Finding Your New Normal") support group series designed for patients after they have completed treatment.   Ms. Doxtater was encouraged to take advantage of our many other support services programs, support groups, and/or counseling in coping with her new life as a cancer survivor after completing anti-cancer treatment.  She was offered support today through active listening and expressive supportive counseling.  She was given information regarding our available services and encouraged to contact me with any questions or for help enrolling in any of our support group/programs.    A total of 50 minutes of face-to-face time was spent with this patient with greater than 50% of that time in counseling and care-coordination.   Sylvan Cheese, NP  Survivorship Program [place] [phone]   Note: PRIMARY CARE PROVIDER Annye Asa, [place] [phone]

## 2015-12-12 ENCOUNTER — Telehealth: Payer: Self-pay | Admitting: Hematology

## 2015-12-12 ENCOUNTER — Other Ambulatory Visit: Payer: Self-pay | Admitting: *Deleted

## 2015-12-12 ENCOUNTER — Encounter: Payer: Self-pay | Admitting: Hematology

## 2015-12-12 NOTE — Telephone Encounter (Signed)
per pof to sch  pt appt-cld pt and adv of time &date of appt for 3/17 @1 :30

## 2015-12-15 ENCOUNTER — Ambulatory Visit
Admission: RE | Admit: 2015-12-15 | Discharge: 2015-12-15 | Disposition: A | Discharge status: Home / Self Care | Payer: BLUE CROSS/BLUE SHIELD | Source: Ambulatory Visit

## 2015-12-15 DIAGNOSIS — Z1231 Encounter for screening mammogram for malignant neoplasm of breast: Secondary | ICD-10-CM

## 2015-12-17 ENCOUNTER — Other Ambulatory Visit: Payer: Self-pay | Admitting: Family Medicine

## 2015-12-17 DIAGNOSIS — R928 Other abnormal and inconclusive findings on diagnostic imaging of breast: Secondary | ICD-10-CM

## 2015-12-19 ENCOUNTER — Telehealth: Payer: Self-pay | Admitting: Hematology

## 2015-12-19 ENCOUNTER — Encounter: Payer: Self-pay | Admitting: Hematology

## 2015-12-19 ENCOUNTER — Ambulatory Visit (HOSPITAL_BASED_OUTPATIENT_CLINIC_OR_DEPARTMENT_OTHER): Payer: BLUE CROSS/BLUE SHIELD | Admitting: Hematology

## 2015-12-19 ENCOUNTER — Other Ambulatory Visit (HOSPITAL_BASED_OUTPATIENT_CLINIC_OR_DEPARTMENT_OTHER): Payer: BLUE CROSS/BLUE SHIELD

## 2015-12-19 VITALS — BP 130/81 | HR 70 | Temp 98.6°F | Resp 18 | Ht 64.0 in | Wt 158.5 lb

## 2015-12-19 DIAGNOSIS — C50812 Malignant neoplasm of overlapping sites of left female breast: Secondary | ICD-10-CM

## 2015-12-19 DIAGNOSIS — C50212 Malignant neoplasm of upper-inner quadrant of left female breast: Secondary | ICD-10-CM | POA: Diagnosis not present

## 2015-12-19 DIAGNOSIS — I10 Essential (primary) hypertension: Secondary | ICD-10-CM

## 2015-12-19 LAB — CBC WITH DIFFERENTIAL/PLATELET
BASO%: 0.5 % (ref 0.0–2.0)
BASOS ABS: 0 10*3/uL (ref 0.0–0.1)
EOS%: 2 % (ref 0.0–7.0)
Eosinophils Absolute: 0.1 10*3/uL (ref 0.0–0.5)
HEMATOCRIT: 40.9 % (ref 34.8–46.6)
HEMOGLOBIN: 13.5 g/dL (ref 11.6–15.9)
LYMPH#: 0.9 10*3/uL (ref 0.9–3.3)
LYMPH%: 23.3 % (ref 14.0–49.7)
MCH: 30.7 pg (ref 25.1–34.0)
MCHC: 33 g/dL (ref 31.5–36.0)
MCV: 93 fL (ref 79.5–101.0)
MONO#: 0.4 10*3/uL (ref 0.1–0.9)
MONO%: 8.7 % (ref 0.0–14.0)
NEUT%: 65.5 % (ref 38.4–76.8)
NEUTROS ABS: 2.6 10*3/uL (ref 1.5–6.5)
Platelets: 266 10*3/uL (ref 145–400)
RBC: 4.4 10*6/uL (ref 3.70–5.45)
RDW: 13.3 % (ref 11.2–14.5)
WBC: 4 10*3/uL (ref 3.9–10.3)

## 2015-12-19 LAB — COMPREHENSIVE METABOLIC PANEL
ALBUMIN: 3.8 g/dL (ref 3.5–5.0)
ALK PHOS: 76 U/L (ref 40–150)
ALT: 22 U/L (ref 0–55)
ANION GAP: 10 meq/L (ref 3–11)
AST: 17 U/L (ref 5–34)
BILIRUBIN TOTAL: 0.77 mg/dL (ref 0.20–1.20)
BUN: 11.7 mg/dL (ref 7.0–26.0)
CO2: 29 mEq/L (ref 22–29)
CREATININE: 0.7 mg/dL (ref 0.6–1.1)
Calcium: 9.8 mg/dL (ref 8.4–10.4)
Chloride: 103 mEq/L (ref 98–109)
EGFR: 87 mL/min/{1.73_m2} — AB (ref >90)
GLUCOSE: 92 mg/dL (ref 70–140)
Potassium: 4 mEq/L (ref 3.5–5.1)
Sodium: 142 mEq/L (ref 136–145)
Total Protein: 7.4 g/dL (ref 6.4–8.3)

## 2015-12-19 NOTE — Telephone Encounter (Signed)
Gave adn pritned appt sched and avs for pt for June  °

## 2015-12-19 NOTE — Progress Notes (Signed)
[facility]  Telephone:([phone]) [phone] Fax:([phone]) [phone]  Clinic follow-up Note   Patient Care Team: Midge Minium, MD as PCP - General (Family Medicine) Fanny Skates, MD as Consulting Physician (General Surgery) Truitt Merle, MD as Consulting Physician (Hematology) Thea Silversmith, MD as Consulting Physician (Radiation Oncology) Rockwell Germany, RN as Registered Nurse Mauro Kaufmann, RN as Registered Nurse Holley Bouche, NP as Nurse Practitioner (Nurse Practitioner)   CHIEF COMPLAINTS:  Follow up breast cancer  Oncology History   Breast cancer of upper-inner quadrant of left female breast   Staging form: Breast, AJCC 7th Edition     Clinical stage from 12/25/2014: Stage IIB (T3, N0, M0) - Unsigned       Staging comments: Staged at breast conference on 3.23.16      Pathologic stage from 06/11/2015: Stage IIIA (yT3, N1a, cM0) - Unsigned             Breast cancer of upper-inner quadrant of left female breast ([place])   12/09/2014 Breast US Left breast: diffuse, large, irregular, hypoechoic mass with shadowing located superiorally extending from the 9:30 o'clock position to approximately the 2:30 o'clock position   12/13/2014 Initial Biopsy Left breast core needle bx (11:30): invasive mammary carcinoma, MCIS; left breast bx (9:30): invasive mammary carcinoma.  ER+ (99%), PR+ (43%), HER2/neu negative, Ki67 42%.   12/13/2014 Oncotype testing RS 27, predicted recurrent risk of 18% with tamoxifen alone   12/23/2014 Breast MRI Extensive abnormal enhancement throughout much of the left breast suspicious for additional areas of malignancy. Overall area of this abnormal enhancement covers 8.5 cm x 7 cm x 7.1 cm. Most discrete area of abnormal enhancement lies in the up   12/25/2014 Clinical Stage Stage IIB: T3 N0   01/24/2015 - 05/09/2015 Neo-Adjuvant Chemotherapy Docetaxel and cyclophosphamide q 3weeks   05/17/2015 Breast MRI Marked interval decrease in the previously noted  extensive enhancement throughout the left breast, compatible with response to treatment. No evidence of malignancy within the right breast.   06/11/2015 Surgery Left breast simple mastectomy and sentinel lymph node biopsy. Surgical margins were negative.   06/11/2015 Pathology Results Left breast mastectomy showed residual scattered tumor cells consistent with invasive grade 1 lobular carcinoma involving approximately 5.9 cm worse of breast parenchyma, atypical lobular hyperplasia, 1 out of 4 axillary lymph nodes were positive.    06/11/2015 Pathologic Stage Stage IIIA: ypT3 ypN1a   07/11/2015 Imaging PET scan showed a  hypermetabolic lesion in the posterior aspect of the lateral segment left liver. low-level diffuse FDG uptake in the anterior left chest wall , likely secondary to mastectomy.    07/17/2015 Imaging  Liver MRI with and without contrast showed tiny simple cyst, no suspicious liver masses.    08/04/2015 - 09/22/2015 Radiation Therapy Adjuvant RT: Left chest wall / 50.4 Gray @ 1.8 Gray per fraction x 28 fractions; left supraclavicular fossa /PAB 37 Gray _0 .8 Gray per fraction x 25 fractions; left scar / 10 Gray at [employer] per fraction x 5 fractions   11/06/2015 -  Anti-estrogen oral therapy Letrozole 2.5 mg daily. Planned duration of therapy 10 years   12/09/2015 Survivorship Survivorship visit completed and copy of care plan given to patient     HISTORY OF PRESENTING ILLNESS:  Emily Griffith 60 y.o. female is here because of newly diagnosed left breast cancer.  This was found by screening mammogram. Her prior screening mammogram was in November 2014 which was negative. Her screening mammogram on 11/25/2014 showed a possible  mass and distortion in the left breast. She further underwent diagnostic mammogram and ultrasound on 12/09/2014, which showed a diffuse large irregular hypoechogenic mass extending from 9:30 o'clock position 2-30 o'clock position. Biopsy of this large mass at 2 different  positions showed invasive lobular carcinoma.  She denies any symptoms. She feels well overall. She denies any pain, cough, dyspnea, or any GI symptoms. 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 No recent weight loss. No change of her appetite.   She had right breast biopsy was benign. She always has lumpy breast which has not changed per patient. She has IBS, she has dirrhea which is usually triggered by stress, and she takes Imodium as needed.    CURRENT THERAPY: letrozole 1 mg once daily, started on 11/06/2015  INTERIM HISTORY: Emily Griffith returns for follow-up. She has been taking letrozole for 6 weeks now, tolerating very well. She had 3 mild episode of hot flash, happened during the day, tolerable. No other noticeable side effects. She still has slightly limited range of motion of the left shoulder, and she continues exercising at home. 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 No other new complaints.  MEDICAL HISTORY:  Past Medical History  Diagnosis Date  . Chicken pox   . Hypertension   . Hyperlipidemia   . IBS (irritable bowel syndrome)   . Breast cancer of upper-inner quadrant of left female breast ([place]) 12/20/2014  . History of hiatal hernia   . Breast cancer [facility])     SURGICAL HISTORY: Past Surgical History  Procedure Laterality Date  . Laser ablation of the cervix  1991  . Colonoscopy  06/18/2005  . Mastectomy w/ sentinel node biopsy Left 06/11/2015  . Simple mastectomy with axillary sentinel node biopsy Left 06/11/2015    Procedure: LEFT TOTAL MASTECTOMY WITH LEFT SENTINEL NODE BIOPSY;  Surgeon: Fanny Skates, MD;  Location: [facility];  Service: General;  Laterality: Left;    SOCIAL HISTORY: History   Social History  . Marital Status: Married    Spouse Name: N/A  . Number of Children: 0  . Years of Education: N/A   Occupational History  . English as a second language teacher for a  company    Social History Main Topics  . Smoking status: Never Smoker   . Smokeless tobacco: Not on file  . Alcohol Use: Yes     Comment: social   . Drug Use: No  . Sexual Activity: Not on file   Other Topics Concern  . Not on file   Social History Narrative   GYN HISTORY  Menarchal: 11 LMP: 03/2013  Contraceptive: no HRT: no  G0P0:    FAMILY HISTORY: Family History  Problem Relation Age of Onset  . Hypertension Mother   . Sudden death Father 30    bleeding   . Cancer Maternal Uncle 60    unknown cancer   . Cancer Paternal Uncle 36    unknown cancer     ALLERGIES:  is allergic to lactose intolerance (gi).  MEDICATIONS:  Current Outpatient Prescriptions  Medication Sig Dispense Refill  . atorvastatin (LIPITOR) 40 MG tablet TAKE 1 TABLET EVERY NIGHT AT BEDTIME 90 tablet 1  . Cholecalciferol (VITAMIN D) 2000 UNITS CAPS Take 2,000 Units by mouth daily.     Marland Kitchen letrozole (FEMARA) 2.5 MG tablet Take 1 tablet (2.5 mg total) by mouth daily. 30 tablet 1  .  lisinopril (PRINIVIL,ZESTRIL) 20 MG tablet Take 20 mg by mouth daily.     . potassium chloride SA (K-DUR,KLOR-CON) 20 MEQ tablet TAKE 1 TABLET DAILY 90 tablet 1  . Vitamin D, Ergocalciferol, (DRISDOL) 50000 units CAPS capsule Take 1 capsule (50,000 Units total) by mouth every 7 (seven) days. 12 capsule 0  . dicyclomine (BENTYL) 20 MG tablet TAKE 1 TABLET BY MOUTH EVERY 6 HOURS (Patient not taking: Reported on 12/09/2015) 60 tablet 6   No current facility-administered medications for this visit.    REVIEW OF SYSTEMS:   Constitutional: Denies fevers, chills or abnormal night sweats Eyes: Denies blurriness of vision, double vision or watery eyes Ears, nose, mouth, throat, and face: Denies mucositis or sore throat Respiratory: Denies cough, dyspnea or wheezes Cardiovascular: Denies palpitation, chest discomfort or lower extremity swelling Gastrointestinal:  Denies nausea, heartburn or change in bowel habits Skin: Denies  abnormal skin rashes Lymphatics: Denies new lymphadenopathy or easy bruising Neurological:Denies numbness, tingling or new weaknesses Behavioral/Psych: Mood is stable, no new changes  All other systems were reviewed with the patient and are negative.  PHYSICAL EXAMINATION: ECOG PERFORMANCE STATUS: 0 - Asymptomatic  Filed Vitals:   12/19/15 1348  BP: 130/81  Pulse: 70  Temp: 98.6 F (37 C)  Resp: 18   Filed Weights   12/19/15 1348  Weight: 158 lb 8 oz (71.895 kg)    GENERAL:alert, no distress and comfortable SKIN: skin color, texture, turgor are normal, no rashes or significant lesions EYES: normal, conjunctiva are pink and non-injected, sclera clear OROPHARYNX:no exudate, no erythema and lips, buccal mucosa, and tongue normal  NECK: supple, thyroid normal size, non-tender, without nodularity LYMPH:  no palpable lymphadenopathy in the cervical, axillary or inguinal LUNGS: clear to auscultation and percussion with normal breathing effort HEART: regular rate & rhythm and no murmurs and no lower extremity edema ABDOMEN:abdomen soft, non-tender and normal bowel sounds Musculoskeletal:no cyanosis of digits and no clubbing  PSYCH: alert & oriented x 3 with fluent speech NEURO: no focal motor/sensory deficits Breasts: Breast inspection showed them to be symmetrical with no nipple discharge. Left breast is surgically absent, mild diffuse skin pigmentation on the left frontal chest wall have improved. Palpitation of the right breast and axilla revealed no obvious mass that I could appreciate.   LABORATORY DATA:  I have reviewed the data as listed CBC Latest Ref Rng 12/19/2015 11/24/2015 11/06/2015  WBC 3.9 - 10.3 10e3/uL 4.0 6.0 4.5  Hemoglobin 11.6 - 15.9 g/dL 13.5 14.1 13.5  Hematocrit 34.8 - 46.6 % 40.9 41.6 39.9  Platelets 145 - 400 10e3/uL 266 296.0 246    CMP Latest Ref Rng 12/19/2015 11/24/2015 11/06/2015  Glucose 70 - 140 mg/dl 92 85 89  BUN 7.0 - 26.0 mg/dL 11.7 11 13.0    Creatinine 0.6 - 1.1 mg/dL 0.7 0.58 0.7  Sodium 136 - 145 mEq/L 142 139 140  Potassium 3.5 - 5.1 mEq/L 4.0 4.0 3.8  Chloride 96 - 112 mEq/L - 101 -  CO2 22 - 29 mEq/L _0 Calcium 8.4 - 10.4 mg/dL 9.8 10.1 9.6  Total Protein 6.4 - 8.3 g/dL 7.4 7.4 7.3  Total Bilirubin 0.20 - 1.20 mg/dL 0.77 0.9 0.86  Alkaline Phos 40 - 150 U/L 76 68 77  AST 5 - 34 U/L _1 ALT 0 - 55 U/L _2 PATHOLOGY REPORT 12/13/2014 Diagnosis 06/11/2015 1. Breast, simple mastectomy, Left - RESIDUAL SCATTERED TUMOR CELLS CONSISTENT WITH INVASIVE GRADE  I LOBULAR CARCINOMA INVOLVING APPROXIMATELY 5.9 CM WORTH OF BREAST PARENCHYMA. - LOBULAR NEOPLASIA (ATYPICAL LOBULAR HYPERPLASIA). - SCATTERED CALCIFICATIONS PRESENT. - MARGINS ARE NEGATIVE. - ONE BENIGN LYMPH NODE WITH NO TUMOR SEEN (0/1). - SEE ONCOLOGY TEMPLATE. 2. Lymph node, sentinel, biopsy, Left axillary #1 - ONE BENIGN LYMPH NODE WITH NO TUMOR SEEN (0/1). 3. Lymph node, sentinel, biopsy, Left axillary #2 - ONE BENIGN LYMPH NODE WITH NO TUMOR SEEN (0/1). 4. Lymph node, sentinel, biopsy, Left axillary #3 - ONE LYMPH NODE POSITIVE FOR METASTATIC LOBULAR CARCINOMA (1/1).  Microscopic Comment 1. BREAST, INVASIVE TUMOR, WITH LYMPH NODES PRESENT Specimen, including laterality and lymph node sampling (sentinel, non-sentinel): Left breast with sentinel lymph node sampling. Procedure: Left mastectomy with left sentinel lymph node biopsies. Histologic type: Invasive lobular carcinoma. Grade: I. Tubule formation: 3. Nuclear pleomorphism: 1. Mitotic: 1. Tumor size: (based on gross measurement) Scattered tumor cells involve approximately 5.9 cm worth of breast parenchyma. Margins: Invasive, distance to closest margin: At least 0.4 cm (all margins). In-situ, distance to closest margin: At least 0.4 cm (all margin). Lymphovascular invasion: Definitive lymph/vascular invasion is not identified, however tumor is present within a lymph node tissue,  see below. Ductal carcinoma in situ: Not identified. Lobular neoplasia: Yes, atypical lobular hyperplasia is present. Tumor focality: Unifocal. Treatment effect: Extensive therapy related changes are present throughout the tumor mass with only scattered residual tumor cells present. The scattered cells involve a 5.9 cm (approximate measurement) area of indurated ill defined soft tissue. Significant treatment effect is not identified within the lymph nodes. Extent of tumor: Skin: Not involved. Nipple: Tumor involves nipple ducts underlying the surface. Skeletal muscle: Not received. Lymph nodes: Examined: 3 Sentinel. 1 Non-sentinel. 4 Total. Lymph nodes with metastasis: 1. Isolated tumor cells (< 0.2 mm): 0. Micrometastasis: (> 0.2 mm and < 2.0 mm): 0. Macrometastasis: (> 2.0 mm): 1. Extracapsular extension: Not identified. Breast prognostic profile: Performed on previous case [id number] 1) Left 11:30 o'clock biopsy ([id number], part 1). Estrogen receptor: 99% positive. Progesterone receptor: 43% positive. Her 2 neu: 1.84 ratio, negative. Ki-67: 42%. 2) Left 9:30 biopsy ([id number], part 2) Estrogen receptor: 99%, positive. Progesterone receptor: 71%, positive. Her 2 neu: 1.80 ratio, negative. Ki-67: 33%. Additional breast findings: Fibrocystic changes with focal usual ducal hyperplasia; benign ducts with calcifications; stromal calcifications present; neoadjuvant changes. TNM: ypT3, ypN1a. Comments: A cytokeratin AE1/AE3 immunostain is performed on all lymph node tissue (5 stains total). The staining pattern is compatible with the above findings. Dr. Lyndon Code is in agreement that the tumor is best staged as a ypT3 tumor. As Her-2 neu was previously negative, this will be repeated on a representative slide with tumor and reported in an addendum to follow. (RH;kh 06-12-15)   1. FLUORESCENCE IN-SITU HYBRIDIZATION Results: HER2 - NEGATIVE RATIO OF HER2/CEP17 SIGNALS  1.74 AVERAGE HER2 COPY NUMBER PER CELL 3.30  RADIOGRAPHIC STUDIES: I have personally reviewed the radiological images as listed and agreed with the findings in the report.  Breast MRI 05/17/2059 IMPRESSION: 1. Marked interval decrease in the previously noted extensive enhancement throughout the left breast, compatible with response to treatment. No evidence of malignancy within the right breast. 2. Nonspecific focus of skin enhancement along the medial aspect of the left breast. 3. Incidentally noted hiatal hernia with possible asymmetric wall thickening.   ASSESSMENT & PLAN:  60 yo female, with PMH of  Hypertension and a mild IBS , otherwise very fit and healthy postmenopausal woman , who was found to have left breast cancer by screening  mammogram.  1. CT3N0M0,  Stage IIB,  Invasive lobular carcinoma,  Strongly ER and PR positive, HER-2 negative, Ki67 33-42%, and lobular carcinoma in situ, ypT3N1aMx, pathological stage IIIA after neoadjuvant chemo  -She received neoadjuvant 6 cycle of TC, followed by left mastectomy and sentinel lymph node biopsy, surgical margins were negative. -I discussed her surgical pathology findings with patient and her husband in great details. 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 -Her surgical pathology findings were discussed in the tumor board, Dr. Dalbert Batman recommends against axillary lymph node dissection, and  she received adjuvant radiation -Her PET scan was negative for distant metastasis, this was reviewed with patient and her husband.   -Giving the strong ER/PR positivity, I would recommend adjuvant endocrine therapy with aromatase inhibitor, likely 10 years due to her advanced stage and high risk of recurrence. Potential side effects, which includes but not limited to, hot flash, mood sewing, skiing and vaginal  dryness, osteoporosis, slightly increased risk of cardiovascular disease, arthralgia, etc. were discussed with her in great details. She voiced good understanding and agrees to proceed. -she is on adjuvant letrozole, tolerating well, we'll continue. Plan for total of 10 years. -she declined PALLIS trial  -we'll continue breast cancer surveillance. She had screening mammogram last week, which showed  suspicious mass in the right breast, she'll be scheduled for a repeat mammogram and ultrasound. I encouraged her to call the breast center and to get it down as possible.  2.  Hypertension - continue medication. Follow-up of his primary care physician   Plan: -Lab result reviewed with her, normal CBC and CMP  -continue letrozole -I encouraged her to call breast Center to schedule her second look of a mammogram and ultrasound next week -I'll see her back in 3 months, also, if her second mammogram and ultrasound is abnormal.   Patient and her husband had multiple is she taking long I answered to their satisfaction. The patient knows to call the clinic with any problems, questions or concerns.  I spent 30 minutes counseling the patient face to face. The total time spent in the appointment was 40 minutes and more than 50% was on counseling.     Truitt Merle, MD 12/19/2015   6:23 PM

## 2015-12-23 ENCOUNTER — Encounter: Payer: Self-pay | Admitting: Hematology

## 2015-12-25 ENCOUNTER — Ambulatory Visit
Admission: RE | Admit: 2015-12-25 | Discharge: 2015-12-25 | Disposition: A | Discharge status: Home / Self Care | Payer: BLUE CROSS/BLUE SHIELD | Source: Ambulatory Visit | Attending: Family Medicine | Admitting: Family Medicine

## 2015-12-25 DIAGNOSIS — R928 Other abnormal and inconclusive findings on diagnostic imaging of breast: Secondary | ICD-10-CM

## 2016-01-06 ENCOUNTER — Encounter: Payer: Self-pay | Admitting: Hematology

## 2016-01-06 ENCOUNTER — Other Ambulatory Visit: Payer: Self-pay | Admitting: *Deleted

## 2016-01-06 MED ORDER — LETROZOLE 2.5 MG PO TABS: 2.5000 mg | ORAL_TABLET | Freq: Every day | ORAL | Status: DC

## 2016-01-12 ENCOUNTER — Encounter: Payer: Self-pay | Admitting: Physical Therapy

## 2016-02-02 ENCOUNTER — Other Ambulatory Visit: Payer: Self-pay | Admitting: Family Medicine

## 2016-02-02 NOTE — Telephone Encounter (Signed)
Medication filled to pharmacy as requested.   

## 2016-03-12 ENCOUNTER — Other Ambulatory Visit: Payer: Self-pay | Admitting: Family Medicine

## 2016-03-12 NOTE — Telephone Encounter (Signed)
Medication filled to pharmacy as requested.   

## 2016-03-30 ENCOUNTER — Telehealth: Payer: Self-pay | Admitting: Hematology

## 2016-03-30 NOTE — Telephone Encounter (Signed)
husband called to r/s appt-gave spouse r/s time & date of appt for 7/20@12 :52

## 2016-04-01 ENCOUNTER — Telehealth: Payer: Self-pay | Admitting: Hematology

## 2016-04-01 NOTE — Telephone Encounter (Signed)
pt cld to r/s appt-gave pt r/s time & date of 7/21 @12 :45

## 2016-04-02 ENCOUNTER — Other Ambulatory Visit: Payer: BLUE CROSS/BLUE SHIELD

## 2016-04-02 ENCOUNTER — Ambulatory Visit: Payer: BLUE CROSS/BLUE SHIELD | Admitting: Hematology

## 2016-04-08 ENCOUNTER — Encounter: Payer: Self-pay | Admitting: Family Medicine

## 2016-04-22 ENCOUNTER — Ambulatory Visit: Payer: Self-pay | Admitting: Radiation Oncology

## 2016-04-22 ENCOUNTER — Ambulatory Visit: Payer: BLUE CROSS/BLUE SHIELD | Admitting: Hematology

## 2016-04-22 ENCOUNTER — Other Ambulatory Visit: Payer: BLUE CROSS/BLUE SHIELD

## 2016-04-23 ENCOUNTER — Ambulatory Visit (HOSPITAL_BASED_OUTPATIENT_CLINIC_OR_DEPARTMENT_OTHER): Payer: BLUE CROSS/BLUE SHIELD | Admitting: Hematology

## 2016-04-23 ENCOUNTER — Other Ambulatory Visit (HOSPITAL_BASED_OUTPATIENT_CLINIC_OR_DEPARTMENT_OTHER): Payer: BLUE CROSS/BLUE SHIELD

## 2016-04-23 ENCOUNTER — Encounter: Payer: Self-pay | Admitting: Hematology

## 2016-04-23 ENCOUNTER — Telehealth: Payer: Self-pay | Admitting: Hematology

## 2016-04-23 VITALS — BP 134/86 | HR 79 | Temp 98.3°F | Resp 18 | Ht 64.0 in | Wt 158.9 lb

## 2016-04-23 DIAGNOSIS — E2839 Other primary ovarian failure: Secondary | ICD-10-CM | POA: Diagnosis not present

## 2016-04-23 DIAGNOSIS — C50212 Malignant neoplasm of upper-inner quadrant of left female breast: Secondary | ICD-10-CM

## 2016-04-23 DIAGNOSIS — I10 Essential (primary) hypertension: Secondary | ICD-10-CM | POA: Diagnosis not present

## 2016-04-23 LAB — CBC WITH DIFFERENTIAL/PLATELET
BASO%: 0.5 % (ref 0.0–2.0)
BASOS ABS: 0 10*3/uL (ref 0.0–0.1)
EOS ABS: 0.1 10*3/uL (ref 0.0–0.5)
EOS%: 1.5 % (ref 0.0–7.0)
HEMATOCRIT: 42.5 % (ref 34.8–46.6)
HGB: 14.3 g/dL (ref 11.6–15.9)
LYMPH#: 1.2 10*3/uL (ref 0.9–3.3)
LYMPH%: 30.4 % (ref 14.0–49.7)
MCH: 30.9 pg (ref 25.1–34.0)
MCHC: 33.6 g/dL (ref 31.5–36.0)
MCV: 91.8 fL (ref 79.5–101.0)
MONO#: 0.4 10*3/uL (ref 0.1–0.9)
MONO%: 8.8 % (ref 0.0–14.0)
NEUT#: 2.4 10*3/uL (ref 1.5–6.5)
NEUT%: 58.8 % (ref 38.4–76.8)
PLATELETS: 256 10*3/uL (ref 145–400)
RBC: 4.63 10*6/uL (ref 3.70–5.45)
RDW: 14 % (ref 11.2–14.5)
WBC: 4.1 10*3/uL (ref 3.9–10.3)

## 2016-04-23 LAB — COMPREHENSIVE METABOLIC PANEL
ALK PHOS: 79 U/L (ref 40–150)
ALT: 16 U/L (ref 0–55)
ANION GAP: 12 meq/L — AB (ref 3–11)
AST: 18 U/L (ref 5–34)
Albumin: 4.1 g/dL (ref 3.5–5.0)
BUN: 12.8 mg/dL (ref 7.0–26.0)
CALCIUM: 10.4 mg/dL (ref 8.4–10.4)
CHLORIDE: 102 meq/L (ref 98–109)
CO2: 27 meq/L (ref 22–29)
CREATININE: 0.8 mg/dL (ref 0.6–1.1)
EGFR: 84 mL/min/{1.73_m2} — AB (ref >90)
Glucose: 100 mg/dl (ref 70–140)
Potassium: 3.7 mEq/L (ref 3.5–5.1)
Sodium: 141 mEq/L (ref 136–145)
Total Bilirubin: 0.89 mg/dL (ref 0.20–1.20)
Total Protein: 7.9 g/dL (ref 6.4–8.3)

## 2016-04-23 NOTE — Telephone Encounter (Signed)
Gave pt cal & avs, pt calls for bone density

## 2016-04-23 NOTE — Progress Notes (Signed)
[facility]  Telephone:([phone]) [phone] Fax:([phone]) [phone]  Clinic follow-up Note   Patient Care Team: Midge Minium, MD as PCP - General (Family Medicine) Fanny Skates, MD as Consulting Physician (General Surgery) Truitt Merle, MD as Consulting Physician (Hematology) Thea Silversmith, MD as Consulting Physician (Radiation Oncology) Rockwell Germany, RN as Registered Nurse Mauro Kaufmann, RN as Registered Nurse Holley Bouche, NP as Nurse Practitioner (Nurse Practitioner)   CHIEF COMPLAINTS:  Follow up breast cancer  Oncology History   Breast cancer of upper-inner quadrant of left female breast   Staging form: Breast, AJCC 7th Edition     Clinical stage from 12/25/2014: Stage IIB (T3, N0, M0) - Unsigned       Staging comments: Staged at breast conference on 3.23.16      Pathologic stage from 06/11/2015: Stage IIIA (yT3, N1a, cM0) - Unsigned             Breast cancer of upper-inner quadrant of left female breast ([place])   12/09/2014 Breast US Left breast: diffuse, large, irregular, hypoechoic mass with shadowing located superiorally extending from the 9:30 o'clock position to approximately the 2:30 o'clock position   12/13/2014 Initial Biopsy Left breast core needle bx (11:30): invasive mammary carcinoma, MCIS; left breast bx (9:30): invasive mammary carcinoma.  ER+ (99%), PR+ (43%), HER2/neu negative, Ki67 42%.   12/13/2014 Oncotype testing RS 27, predicted recurrent risk of 18% with tamoxifen alone   12/23/2014 Breast MRI Extensive abnormal enhancement throughout much of the left breast suspicious for additional areas of malignancy. Overall area of this abnormal enhancement covers 8.5 cm x 7 cm x 7.1 cm. Most discrete area of abnormal enhancement lies in the up   12/25/2014 Clinical Stage Stage IIB: T3 N0   01/24/2015 - 05/09/2015 Neo-Adjuvant Chemotherapy Docetaxel and cyclophosphamide q 3weeks   05/17/2015 Breast MRI Marked interval decrease in the previously noted  extensive enhancement throughout the left breast, compatible with response to treatment. No evidence of malignancy within the right breast.   06/11/2015 Surgery Left breast simple mastectomy and sentinel lymph node biopsy. Surgical margins were negative.   06/11/2015 Pathology Results Left breast mastectomy showed residual scattered tumor cells consistent with invasive grade 1 lobular carcinoma involving approximately 5.9 cm worse of breast parenchyma, atypical lobular hyperplasia, 1 out of 4 axillary lymph nodes were positive.    06/11/2015 Pathologic Stage Stage IIIA: ypT3 ypN1a   07/11/2015 Imaging PET scan showed a  hypermetabolic lesion in the posterior aspect of the lateral segment left liver. low-level diffuse FDG uptake in the anterior left chest wall , likely secondary to mastectomy.    07/17/2015 Imaging  Liver MRI with and without contrast showed tiny simple cyst, no suspicious liver masses.    08/04/2015 - 09/22/2015 Radiation Therapy Adjuvant RT: Left chest wall / 50.4 Gray @ 1.8 Gray per fraction x 28 fractions; left supraclavicular fossa /PAB 37 Gray _0 .8 Gray per fraction x 25 fractions; left scar / 10 Gray at [employer] per fraction x 5 fractions   11/06/2015 -  Anti-estrogen oral therapy Letrozole 2.5 mg daily. Planned duration of therapy 10 years   12/09/2015 Survivorship Survivorship visit completed and copy of care plan given to patient     HISTORY OF PRESENTING ILLNESS:  Emily Griffith 60 y.o. female is here because of newly diagnosed left breast cancer.  This was found by screening mammogram. Her prior screening mammogram was in November 2014 which was negative. Her screening mammogram on 11/25/2014 showed a possible  mass and distortion in the left breast. She further underwent diagnostic mammogram and ultrasound on 12/09/2014, which showed a diffuse large irregular hypoechogenic mass extending from 9:30 o'clock position 2-30 o'clock position. Biopsy of this large mass at 2 different  positions showed invasive lobular carcinoma.  She denies any symptoms. She feels well overall. She denies any pain, cough, dyspnea, or any GI symptoms. 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 No recent weight loss. No change of her appetite.   She had right breast biopsy was benign. She always has lumpy breast which has not changed per patient. She has IBS, she has dirrhea which is usually triggered by stress, and she takes Imodium as needed.    CURRENT THERAPY: letrozole 1 mg once daily, started on 11/06/2015  INTERIM HISTORY: [name] returns for follow-up. She is very compliant with letrozole, and tolerates it well. She denies significant hot flash, no joint or muscle discomfort. She golfs a few times a week, has good appetite and energy level, weight is stable. No other new complaints.  MEDICAL HISTORY:  Past Medical History  Diagnosis Date  . Chicken pox   . Hypertension   . Hyperlipidemia   . IBS (irritable bowel syndrome)   . Breast cancer of upper-inner quadrant of left female breast ([place]) 12/20/2014  . History of hiatal hernia   . Breast cancer [facility])     SURGICAL HISTORY: Past Surgical History  Procedure Laterality Date  . Laser ablation of the cervix  1991  . Colonoscopy  06/18/2005  . Mastectomy w/ sentinel node biopsy Left 06/11/2015  . Simple mastectomy with axillary sentinel node biopsy Left 06/11/2015    Procedure: LEFT TOTAL MASTECTOMY WITH LEFT SENTINEL NODE BIOPSY;  Surgeon: Fanny Skates, MD;  Location: [place];  Service: General;  Laterality: Left;    SOCIAL HISTORY: History   Social History  . Marital Status: Married    Spouse Name: N/A  . Number of Children: 0  . Years of Education: N/A   Occupational History  . 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 Smoking status: Never Smoker   . Smokeless tobacco: Not on file  . Alcohol Use: Yes     Comment: social   . Drug Use: No  . Sexual  Activity: Not on file   Other Topics Concern  . Not on file   Social History Narrative   GYN HISTORY  Menarchal: 11 LMP: 03/2013  Contraceptive: no HRT: no  G0P0:    FAMILY HISTORY: Family History  Problem Relation Age of Onset  . Hypertension Mother   . Sudden death Father 30    bleeding   . Cancer Maternal Uncle 60    unknown cancer   . Cancer Paternal Uncle 31    unknown cancer     ALLERGIES:  is allergic to lactose intolerance (gi).  MEDICATIONS:  Current Outpatient Prescriptions  Medication Sig Dispense Refill  . atorvastatin (LIPITOR) 40 MG tablet TAKE 1 TABLET EVERY NIGHT AT BEDTIME 90 tablet 0  . Cholecalciferol (VITAMIN D) 2000 UNITS CAPS Take 2,000 Units by mouth daily.     Marland Kitchen dicyclomine (BENTYL) 20 MG tablet TAKE 1 TABLET BY MOUTH EVERY 6 HOURS 60 tablet 6  . letrozole (FEMARA) 2.5 MG tablet Take 1 tablet (2.5 mg total) by mouth daily. 90 tablet 4  . lisinopril (PRINIVIL,ZESTRIL) 20 MG tablet TAKE 1 TABLET DAILY 90 tablet 0  .  potassium chloride SA (K-DUR,KLOR-CON) 20 MEQ tablet TAKE 1 TABLET DAILY 90 tablet 1   No current facility-administered medications for this visit.    REVIEW OF SYSTEMS:   Constitutional: Denies fevers, chills or abnormal night sweats Eyes: Denies blurriness of vision, double vision or watery eyes Ears, nose, mouth, throat, and face: Denies mucositis or sore throat Respiratory: Denies cough, dyspnea or wheezes Cardiovascular: Denies palpitation, chest discomfort or lower extremity swelling Gastrointestinal:  Denies nausea, heartburn or change in bowel habits Skin: Denies abnormal skin rashes Lymphatics: Denies new lymphadenopathy or easy bruising Neurological:Denies numbness, tingling or new weaknesses Behavioral/Psych: Mood is stable, no new changes  All other systems were reviewed with the patient and are negative.  PHYSICAL EXAMINATION: ECOG PERFORMANCE STATUS: 0 - Asymptomatic  Filed Vitals:   04/23/16 1245  BP: 134/86    Pulse: 79  Temp: 98.3 F (36.8 C)  Resp: 18   Filed Weights   04/23/16 1245  Weight: 158 lb 14.4 oz (72.077 kg)    GENERAL:alert, no distress and comfortable SKIN: skin color, texture, turgor are normal, no rashes or significant lesions EYES: normal, conjunctiva are pink and non-injected, sclera clear OROPHARYNX:no exudate, no erythema and lips, buccal mucosa, and tongue normal  NECK: supple, thyroid normal size, non-tender, without nodularity LYMPH:  no palpable lymphadenopathy in the cervical, axillary or inguinal LUNGS: clear to auscultation and percussion with normal breathing effort HEART: regular rate & rhythm and no murmurs and no lower extremity edema ABDOMEN:abdomen soft, non-tender and normal bowel sounds Musculoskeletal:no cyanosis of digits and no clubbing  PSYCH: alert & oriented x 3 with fluent speech NEURO: no focal motor/sensory deficits Breasts: Breast inspection showed them to be symmetrical with no nipple discharge. Left breast is surgically absent, no skin pigmentation, no palpable nodules on the left chest wall. Palpitation of the right breast and axilla revealed no obvious mass that I could appreciate.   LABORATORY DATA:  I have reviewed the data as listed CBC Latest Ref Rng 04/23/2016 12/19/2015 11/24/2015  WBC 3.9 - 10.3 10e3/uL 4.1 4.0 6.0  Hemoglobin 11.6 - 15.9 g/dL 14.3 13.5 14.1  Hematocrit 34.8 - 46.6 % 42.5 40.9 41.6  Platelets 145 - 400 10e3/uL 256 266 296.0    CMP Latest Ref Rng 04/23/2016 12/19/2015 11/24/2015  Glucose 70 - 140 mg/dl 100 92 85  BUN 7.0 - 26.0 mg/dL 12.8 11.7 11  Creatinine 0.6 - 1.1 mg/dL 0.8 0.7 0.58  Sodium 136 - 145 mEq/L 141 142 139  Potassium 3.5 - 5.1 mEq/L 3.7 4.0 4.0  Chloride 96 - 112 mEq/L - - 101  CO2 22 - 29 mEq/L '27 29 30  '$ Calcium 8.4 - 10.4 mg/dL 10.4 9.8 10.1  Total Protein 6.4 - 8.3 g/dL 7.9 7.4 7.4  Total Bilirubin 0.20 - 1.20 mg/dL 0.89 0.77 0.9  Alkaline Phos 40 - 150 U/L 79 76 68  AST 5 - 34 U/L '18 17 15   '$ ALT 0 - 55 U/L '16 22 16    '$ PATHOLOGY REPORT 12/13/2014 Diagnosis 06/11/2015 1. Breast, simple mastectomy, Left - RESIDUAL SCATTERED TUMOR CELLS CONSISTENT WITH INVASIVE GRADE I LOBULAR CARCINOMA INVOLVING APPROXIMATELY 5.9 CM WORTH OF BREAST PARENCHYMA. - LOBULAR NEOPLASIA (ATYPICAL LOBULAR HYPERPLASIA). - SCATTERED CALCIFICATIONS PRESENT. - MARGINS ARE NEGATIVE. - ONE BENIGN LYMPH NODE WITH NO TUMOR SEEN (0/1). - SEE ONCOLOGY TEMPLATE. 2. Lymph node, sentinel, biopsy, Left axillary #1 - ONE BENIGN LYMPH NODE WITH NO TUMOR SEEN (0/1). 3. Lymph node, sentinel, biopsy, Left axillary #2 -  ONE BENIGN LYMPH NODE WITH NO TUMOR SEEN (0/1). 4. Lymph node, sentinel, biopsy, Left axillary #3 - ONE LYMPH NODE POSITIVE FOR METASTATIC LOBULAR CARCINOMA (1/1).  Microscopic Comment 1. BREAST, INVASIVE TUMOR, WITH LYMPH NODES PRESENT Specimen, including laterality and lymph node sampling (sentinel, non-sentinel): Left breast with sentinel lymph node sampling. Procedure: Left mastectomy with left sentinel lymph node biopsies. Histologic type: Invasive lobular carcinoma. Grade: I. Tubule formation: 3. Nuclear pleomorphism: 1. Mitotic: 1. Tumor size: (based on gross measurement) Scattered tumor cells involve approximately 5.9 cm worth of breast parenchyma. Margins: Invasive, distance to closest margin: At least 0.4 cm (all margins). In-situ, distance to closest margin: At least 0.4 cm (all margin). Lymphovascular invasion: Definitive lymph/vascular invasion is not identified, however tumor is present within a lymph node tissue, see below. Ductal carcinoma in situ: Not identified. Lobular neoplasia: Yes, atypical lobular hyperplasia is present. Tumor focality: Unifocal. Treatment effect: Extensive therapy related changes are present throughout the tumor mass with only scattered residual tumor cells present. The scattered cells involve a 5.9 cm (approximate measurement) area of indurated  ill defined soft tissue. Significant treatment effect is not identified within the lymph nodes. Extent of tumor: Skin: Not involved. Nipple: Tumor involves nipple ducts underlying the surface. Skeletal muscle: Not received. Lymph nodes: Examined: 3 Sentinel. 1 Non-sentinel. 4 Total. Lymph nodes with metastasis: 1. Isolated tumor cells (< 0.2 mm): 0. Micrometastasis: (> 0.2 mm and < 2.0 mm): 0. Macrometastasis: (> 2.0 mm): 1. Extracapsular extension: Not identified. Breast prognostic profile: Performed on previous case [id number] 1) Left 11:30 o'clock biopsy ([id number], part 1). Estrogen receptor: 99% positive. Progesterone receptor: 43% positive. Her 2 neu: 1.84 ratio, negative. Ki-67: 42%. 2) Left 9:30 biopsy ([id number], part 2) Estrogen receptor: 99%, positive. Progesterone receptor: 71%, positive. Her 2 neu: 1.80 ratio, negative. Ki-67: 33%. Additional breast findings: Fibrocystic changes with focal usual ducal hyperplasia; benign ducts with calcifications; stromal calcifications present; neoadjuvant changes. TNM: ypT3, ypN1a. Comments: A cytokeratin AE1/AE3 immunostain is performed on all lymph node tissue (5 stains total). The staining pattern is compatible with the above findings. Dr. Lyndon Code is in agreement that the tumor is best staged as a ypT3 tumor. As Her-2 neu was previously negative, this will be repeated on a representative slide with tumor and reported in an addendum to follow. (RH;kh 06-12-15)   1. FLUORESCENCE IN-SITU HYBRIDIZATION Results: HER2 - NEGATIVE RATIO OF HER2/CEP17 SIGNALS 1.74 AVERAGE HER2 COPY NUMBER PER CELL 3.30  RADIOGRAPHIC STUDIES: I have personally reviewed the radiological images as listed and agreed with the findings in the report.  Breast MRI 05/17/2059 IMPRESSION: 1. Marked interval decrease in the previously noted extensive enhancement throughout the left breast, compatible with response to treatment. No evidence of  malignancy within the right breast. 2. Nonspecific focus of skin enhancement along the medial aspect of the left breast. 3. Incidentally noted hiatal hernia with possible asymmetric wall thickening.   ASSESSMENT & PLAN:  60 yo female, with PMH of  Hypertension and a mild IBS , otherwise very fit and healthy postmenopausal woman , who was found to have left breast cancer by screening mammogram.  1. CT3N0M0,  Stage IIB,  Invasive lobular carcinoma,  Strongly ER and PR positive, HER-2 negative, Ki67 33-42%, and lobular carcinoma in situ, ypT3N1aMx, pathological stage IIIA after neoadjuvant chemo  -She received neoadjuvant 6 cycle of TC, followed by left mastectomy and sentinel lymph node biopsy, surgical margins were negative. -I discussed her surgical pathology findings with patient and her husband in  great details. 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 -Her surgical pathology findings were discussed in the tumor board, Dr. Dalbert Batman recommends against axillary lymph node dissection, and  she received adjuvant radiation -Her PET scan was negative for distant metastasis, this was reviewed with patient and her husband.   -she is on adjuvant letrozole, tolerating well, we'll continue. Plan for total of 10 years. -we'll continue breast cancer surveillance. She will be due for annual mammogram next March. She'll continue self exam, and a follow-up with [place] routinely.  2.  Hypertension - continue medication. Follow-up of his primary care physician   Plan: -Lab result reviewed with her, normal CBC and CMP  -continue letrozole -Return to clinic in 4 months for follow-up  The patient knows to call the clinic with any problems, questions or concerns.  I spent 15 minutes counseling the patient face to face. The total time spent in the appointment was 20 minutes  and more than 50% was on counseling.     Truitt Merle, MD 04/23/2016   2:18 PM

## 2016-04-24 LAB — CANCER ANTIGEN 27.29: CA 27.29: 40.6 U/mL — ABNORMAL HIGH (ref 0.0–38.6)

## 2016-04-25 ENCOUNTER — Other Ambulatory Visit: Payer: Self-pay | Admitting: Hematology

## 2016-04-25 DIAGNOSIS — C50212 Malignant neoplasm of upper-inner quadrant of left female breast: Secondary | ICD-10-CM

## 2016-04-26 ENCOUNTER — Other Ambulatory Visit: Payer: Self-pay | Admitting: *Deleted

## 2016-04-27 ENCOUNTER — Other Ambulatory Visit: Payer: Self-pay | Admitting: *Deleted

## 2016-04-28 ENCOUNTER — Telehealth: Payer: Self-pay | Admitting: *Deleted

## 2016-04-28 ENCOUNTER — Other Ambulatory Visit: Payer: Self-pay | Admitting: *Deleted

## 2016-04-28 NOTE — Telephone Encounter (Signed)
Pt returned nurse's call.  Informed pt re:  Per Dr. Burr Medico, tumor marker CA27.29 was slightly elevated.  MD not very concerned, but would like for pt to repeat lab in 2 months.  Asked pt to call nurse after lab draw.  Pt understood that a scheduler will contact pt with lab appt date and time. Pt's    Phone     [phone].

## 2016-05-06 ENCOUNTER — Ambulatory Visit: Payer: BLUE CROSS/BLUE SHIELD | Admitting: Oncology

## 2016-05-06 ENCOUNTER — Other Ambulatory Visit: Payer: BLUE CROSS/BLUE SHIELD

## 2016-05-10 ENCOUNTER — Other Ambulatory Visit: Payer: Self-pay | Admitting: Family Medicine

## 2016-05-12 ENCOUNTER — Encounter: Payer: Self-pay | Admitting: Gastroenterology

## 2016-06-02 ENCOUNTER — Ambulatory Visit: Payer: BLUE CROSS/BLUE SHIELD | Admitting: Family Medicine

## 2016-06-04 ENCOUNTER — Encounter: Payer: Self-pay | Admitting: Hematology

## 2016-06-10 ENCOUNTER — Encounter: Payer: Self-pay | Admitting: Family Medicine

## 2016-06-10 ENCOUNTER — Ambulatory Visit (INDEPENDENT_AMBULATORY_CARE_PROVIDER_SITE_OTHER): Payer: BLUE CROSS/BLUE SHIELD | Admitting: Family Medicine

## 2016-06-10 VITALS — BP 134/86 | HR 81 | Temp 98.1°F | Resp 16 | Ht 64.0 in | Wt 162.5 lb

## 2016-06-10 DIAGNOSIS — I10 Essential (primary) hypertension: Secondary | ICD-10-CM

## 2016-06-10 DIAGNOSIS — E785 Hyperlipidemia, unspecified: Secondary | ICD-10-CM | POA: Diagnosis not present

## 2016-06-10 LAB — LIPID PANEL
CHOLESTEROL: 208 mg/dL — AB (ref 0–200)
HDL: 63.2 mg/dL (ref >39.00)
LDL Cholesterol: 126 mg/dL — ABNORMAL HIGH (ref 0–99)
NonHDL: 145.23
Total CHOL/HDL Ratio: 3
Triglycerides: 95 mg/dL (ref 0.0–149.0)
VLDL: 19 mg/dL (ref 0.0–40.0)

## 2016-06-10 LAB — BASIC METABOLIC PANEL
BUN: 11 mg/dL (ref 6–23)
CALCIUM: 9.7 mg/dL (ref 8.4–10.5)
CHLORIDE: 102 meq/L (ref 96–112)
CO2: 31 meq/L (ref 19–32)
Creatinine, Ser: 0.6 mg/dL (ref 0.40–1.20)
GFR: 107.8 mL/min (ref >60.00)
GLUCOSE: 99 mg/dL (ref 70–99)
Potassium: 3.6 mEq/L (ref 3.5–5.1)
SODIUM: 139 meq/L (ref 135–145)

## 2016-06-10 LAB — HEPATIC FUNCTION PANEL
ALBUMIN: 4.4 g/dL (ref 3.5–5.2)
ALK PHOS: 75 U/L (ref 39–117)
ALT: 16 U/L (ref 0–35)
AST: 16 U/L (ref 0–37)
BILIRUBIN DIRECT: 0.2 mg/dL (ref 0.0–0.3)
TOTAL PROTEIN: 7.3 g/dL (ref 6.0–8.3)
Total Bilirubin: 0.8 mg/dL (ref 0.2–1.2)

## 2016-06-10 LAB — CBC WITH DIFFERENTIAL/PLATELET
BASOS ABS: 0 10*3/uL (ref 0.0–0.1)
Basophils Relative: 0.6 % (ref 0.0–3.0)
EOS ABS: 0.1 10*3/uL (ref 0.0–0.7)
Eosinophils Relative: 1.3 % (ref 0.0–5.0)
HCT: 42.1 % (ref 36.0–46.0)
Hemoglobin: 14.5 g/dL (ref 12.0–15.0)
LYMPHS ABS: 1.2 10*3/uL (ref 0.7–4.0)
Lymphocytes Relative: 20.2 % (ref 12.0–46.0)
MCHC: 34.4 g/dL (ref 30.0–36.0)
MCV: 90.6 fl (ref 78.0–100.0)
MONO ABS: 0.4 10*3/uL (ref 0.1–1.0)
MONOS PCT: 6.7 % (ref 3.0–12.0)
NEUTROS ABS: 4.4 10*3/uL (ref 1.4–7.7)
NEUTROS PCT: 71.2 % (ref 43.0–77.0)
PLATELETS: 285 10*3/uL (ref 150.0–400.0)
RBC: 4.65 Mil/uL (ref 3.87–5.11)
RDW: 14.2 % (ref 11.5–15.5)
WBC: 6.2 10*3/uL (ref 4.0–10.5)

## 2016-06-10 MED ORDER — LISINOPRIL-HYDROCHLOROTHIAZIDE 20-12.5 MG PO TABS: 1.0000 | ORAL_TABLET | Freq: Every day | ORAL | 1 refills | Status: DC

## 2016-06-10 NOTE — Patient Instructions (Signed)
Schedule your complete physical in 6 months We'll notify you of your lab results and make any changes if needed Continue to work on healthy diet and regular exercise- you look great!! Restart the Lisinopril HCTZ daily when it arrives from mail order pharmacy Call with any questions or concerns Happy Fall!!!

## 2016-06-10 NOTE — Progress Notes (Signed)
   Subjective:    Patient ID: Emily Griffith, female    DOB: 1956-01-12, 60 y.o.   MRN: [id number]  HPI HTN- chronic problem, on Lisinopril daily w/ adequate control.  Previously on HCTZ but this was stopped during chemo.  Pt's BP is now back into the 130s at home.  No CP, SOB, HAs, visual changes, edema.  Hyperlipidemia- chronic problem, on Lipitor daily.  Still exercising regularly.  No abd pain, N/V.   Review of Systems For ROS see HPI     Objective:   Physical Exam  Constitutional: She is oriented to person, place, and time. She appears well-developed and well-nourished. No distress.  HENT:  Head: Normocephalic and atraumatic.  Eyes: Conjunctivae and EOM are normal. Pupils are equal, round, and reactive to light.  Neck: Normal range of motion. Neck supple. No thyromegaly present.  Cardiovascular: Normal rate, regular rhythm, normal heart sounds and intact distal pulses.   No murmur heard. Pulmonary/Chest: Effort normal and breath sounds normal. No respiratory distress.  Abdominal: Soft. She exhibits no distension. There is no tenderness.  Musculoskeletal: She exhibits no edema.  Lymphadenopathy:    She has no cervical adenopathy.  Neurological: She is alert and oriented to person, place, and time.  Skin: Skin is warm and dry.  Psychiatric: She has a normal mood and affect. Her behavior is normal.  Vitals reviewed.         Assessment & Plan:

## 2016-06-10 NOTE — Assessment & Plan Note (Signed)
Chronic problem.  Tolerating statin w/o difficulty.  Check labs.  Adjust meds prn  

## 2016-06-10 NOTE — Progress Notes (Signed)
Pre visit review using our clinic review tool, if applicable. No additional management support is needed unless otherwise documented below in the visit note. 

## 2016-06-10 NOTE — Assessment & Plan Note (Signed)
Chronic problem.  Adequate control.  Asymptomatic.  Due to fact that pt's BP is creeping back upwards now that she is not in chemo, will restart HCTZ daily.  Pt expressed understanding and is in agreement w/ plan.

## 2016-06-15 DIAGNOSIS — H40023 Open angle with borderline findings, high risk, bilateral: Secondary | ICD-10-CM | POA: Diagnosis not present

## 2016-06-15 DIAGNOSIS — H2513 Age-related nuclear cataract, bilateral: Secondary | ICD-10-CM | POA: Diagnosis not present

## 2016-06-16 ENCOUNTER — Telehealth: Payer: Self-pay | Admitting: Hematology

## 2016-06-16 NOTE — Telephone Encounter (Signed)
Left message for pt re bone density 9/14 @ 2 pm at [facility]

## 2016-06-17 ENCOUNTER — Ambulatory Visit
Admission: RE | Admit: 2016-06-17 | Discharge: 2016-06-17 | Disposition: A | Discharge status: Home / Self Care | Payer: BLUE CROSS/BLUE SHIELD | Source: Ambulatory Visit | Attending: Hematology | Admitting: Hematology

## 2016-06-17 DIAGNOSIS — M85851 Other specified disorders of bone density and structure, right thigh: Secondary | ICD-10-CM | POA: Diagnosis not present

## 2016-06-17 DIAGNOSIS — E2839 Other primary ovarian failure: Secondary | ICD-10-CM

## 2016-06-17 DIAGNOSIS — Z780 Asymptomatic menopausal state: Secondary | ICD-10-CM | POA: Diagnosis not present

## 2016-06-24 ENCOUNTER — Other Ambulatory Visit (HOSPITAL_BASED_OUTPATIENT_CLINIC_OR_DEPARTMENT_OTHER): Payer: BLUE CROSS/BLUE SHIELD

## 2016-06-24 DIAGNOSIS — C50212 Malignant neoplasm of upper-inner quadrant of left female breast: Secondary | ICD-10-CM | POA: Diagnosis not present

## 2016-06-24 LAB — CBC WITH DIFFERENTIAL/PLATELET
BASO%: 0.8 % (ref 0.0–2.0)
BASOS ABS: 0 10*3/uL (ref 0.0–0.1)
EOS ABS: 0.1 10*3/uL (ref 0.0–0.5)
EOS%: 1.8 % (ref 0.0–7.0)
HCT: 42.4 % (ref 34.8–46.6)
HEMOGLOBIN: 14.1 g/dL (ref 11.6–15.9)
LYMPH#: 1.3 10*3/uL (ref 0.9–3.3)
LYMPH%: 26.9 % (ref 14.0–49.7)
MCH: 30.6 pg (ref 25.1–34.0)
MCHC: 33.1 g/dL (ref 31.5–36.0)
MCV: 92.3 fL (ref 79.5–101.0)
MONO#: 0.4 10*3/uL (ref 0.1–0.9)
MONO%: 8.5 % (ref 0.0–14.0)
NEUT%: 62 % (ref 38.4–76.8)
NEUTROS ABS: 3 10*3/uL (ref 1.5–6.5)
PLATELETS: 287 10*3/uL (ref 145–400)
RBC: 4.59 10*6/uL (ref 3.70–5.45)
RDW: 13.7 % (ref 11.2–14.5)
WBC: 4.8 10*3/uL (ref 3.9–10.3)

## 2016-06-24 LAB — COMPREHENSIVE METABOLIC PANEL
ALT: 20 U/L (ref 0–55)
ANION GAP: 10 meq/L (ref 3–11)
AST: 17 U/L (ref 5–34)
Albumin: 3.6 g/dL (ref 3.5–5.0)
Alkaline Phosphatase: 84 U/L (ref 40–150)
BILIRUBIN TOTAL: 0.63 mg/dL (ref 0.20–1.20)
BUN: 14.6 mg/dL (ref 7.0–26.0)
CHLORIDE: 103 meq/L (ref 98–109)
CO2: 28 meq/L (ref 22–29)
CREATININE: 0.7 mg/dL (ref 0.6–1.1)
Calcium: 10 mg/dL (ref 8.4–10.4)
EGFR: 87 mL/min/{1.73_m2} — ABNORMAL LOW (ref >90)
GLUCOSE: 71 mg/dL (ref 70–140)
Potassium: 3.7 mEq/L (ref 3.5–5.1)
SODIUM: 141 meq/L (ref 136–145)
TOTAL PROTEIN: 7.5 g/dL (ref 6.4–8.3)

## 2016-06-25 LAB — CANCER ANTIGEN 27.29: CAN 27.29: 38.4 U/mL (ref 0.0–38.6)

## 2016-06-28 ENCOUNTER — Encounter: Payer: Self-pay | Admitting: Gastroenterology

## 2016-06-28 ENCOUNTER — Ambulatory Visit (AMBULATORY_SURGERY_CENTER): Payer: Self-pay

## 2016-06-28 ENCOUNTER — Telehealth: Payer: Self-pay

## 2016-06-28 VITALS — Ht 65.0 in | Wt 163.0 lb

## 2016-06-28 DIAGNOSIS — Z1211 Encounter for screening for malignant neoplasm of colon: Secondary | ICD-10-CM

## 2016-06-28 MED ORDER — SUPREP BOWEL PREP KIT 17.5-3.13-1.6 GM/177ML PO SOLN: 1.0000 | Freq: Once | ORAL | 0 refills | Status: AC

## 2016-06-28 NOTE — Telephone Encounter (Signed)
Dr Loletha Carrow,      Last chemo May 27, 2015 and currently on oral chemo, Femara.  Just Fyi.  Pt looked well at [facility].  No complaints.                        Thank you, Shelise Maron/PV

## 2016-06-28 NOTE — Progress Notes (Signed)
No allergies to eggs or soy No diet meds No home oxygen No past problems with anesthesia  Declined emmi 

## 2016-07-01 ENCOUNTER — Other Ambulatory Visit: Payer: Self-pay | Admitting: Family Medicine

## 2016-07-05 NOTE — Telephone Encounter (Signed)
Thank you, Dr Loletha Carrow                                Angela/PV

## 2016-07-12 ENCOUNTER — Encounter: Payer: Self-pay | Admitting: Gastroenterology

## 2016-07-12 ENCOUNTER — Ambulatory Visit (AMBULATORY_SURGERY_CENTER): Payer: BLUE CROSS/BLUE SHIELD | Admitting: Gastroenterology

## 2016-07-12 VITALS — BP 136/85 | HR 82 | Temp 99.1°F | Resp 16 | Ht 65.0 in | Wt 163.0 lb

## 2016-07-12 DIAGNOSIS — D122 Benign neoplasm of ascending colon: Secondary | ICD-10-CM

## 2016-07-12 DIAGNOSIS — Z1212 Encounter for screening for malignant neoplasm of rectum: Secondary | ICD-10-CM | POA: Diagnosis not present

## 2016-07-12 DIAGNOSIS — Z1211 Encounter for screening for malignant neoplasm of colon: Secondary | ICD-10-CM

## 2016-07-12 MED ORDER — SODIUM CHLORIDE 0.9 % IV SOLN: 500.0000 mL | INTRAVENOUS | Status: DC

## 2016-07-12 NOTE — Progress Notes (Signed)
Called to room to assist during endoscopic procedure.  Patient ID and intended procedure confirmed with present staff. Received instructions for my participation in the procedure from the performing physician.  

## 2016-07-12 NOTE — Patient Instructions (Signed)
Handouts: Polyps,Hemmorroids.   YOU HAD AN ENDOSCOPIC PROCEDURE TODAY AT [facility]:   Refer to the procedure report that was given to you for any specific questions about what was found during the examination.  If the procedure report does not answer your questions, please call your gastroenterologist to clarify.  If you requested that your care partner not be given the details of your procedure findings, then the procedure report has been included in a sealed envelope for you to review at your convenience later.  YOU SHOULD EXPECT: Some feelings of bloating in the abdomen. Passage of more gas than usual.  Walking can help get rid of the air that was put into your GI tract during the procedure and reduce the bloating. If you had a lower endoscopy (such as a colonoscopy or flexible sigmoidoscopy) you may notice spotting of blood in your stool or on the toilet paper. If you underwent a bowel prep for your procedure, you may not have a normal bowel movement for a few days.  Please Note:  You might notice some irritation and congestion in your nose or some drainage.  This is from the oxygen used during your procedure.  There is no need for concern and it should clear up in a day or so.  SYMPTOMS TO REPORT IMMEDIATELY:   Following lower endoscopy (colonoscopy or flexible sigmoidoscopy):  Excessive amounts of blood in the stool  Significant tenderness or worsening of abdominal pains  Swelling of the abdomen that is new, acute  Fever of 100F or higher  For urgent or emergent issues, a gastroenterologist can be reached at any hour by calling [phone].   DIET:  We do recommend a small meal at first, but then you may proceed to your regular diet.  Drink plenty of fluids but you should avoid alcoholic beverages for 24 hours.  ACTIVITY:  You should plan to take it easy for the rest of today and you should NOT DRIVE or use heavy machinery until tomorrow (because of the sedation  medicines used during the test).    FOLLOW UP: Our staff will call the number listed on your records the next business day following your procedure to check on you and address any questions or concerns that you may have regarding the information given to you following your procedure. If we do not reach you, we will leave a message.  However, if you are feeling well and you are not experiencing any problems, there is no need to return our call.  We will assume that you have returned to your regular daily activities without incident.  If any biopsies were taken you will be contacted by phone or by letter within the next 1-3 weeks.  Please call us at [phone] if you have not heard about the biopsies in 3 weeks.    SIGNATURES/CONFIDENTIALITY: You and/or your care partner have signed paperwork which will be entered into your electronic medical record.  These signatures attest to the fact that that the information above on your After Visit Summary has been reviewed and is understood.  Full responsibility of the confidentiality of this discharge information lies with you and/or your care-partner.

## 2016-07-12 NOTE — Progress Notes (Signed)
Report to PACU, RN, vss, BBS= Clear.  

## 2016-07-12 NOTE — Op Note (Signed)
[place] Patient Name: Emily Griffith Procedure Date: 07/12/2016 1:24 PM MRN: [id number] Endoscopist: Mallie Mussel L. Loletha Carrow , MD Age: 60 Referring MD:  Date of Birth: 1956-07-12 Gender: Female Account #: [id number] Procedure:                Colonoscopy Indications:              Screening for colorectal malignant neoplasm Medicines:                Monitored Anesthesia Care Procedure:                Pre-Anesthesia Assessment:                           - Prior to the procedure, a History and Physical                            was performed, and patient medications and                            allergies were reviewed. The patient's tolerance of                            previous anesthesia was also reviewed. The risks                            and benefits of the procedure and the sedation                            options and risks were discussed with the patient.                            All questions were answered, and informed consent                            was obtained. Prior Anticoagulants: The patient has                            taken no previous anticoagulant or antiplatelet                            agents. ASA Grade Assessment: II - A patient with                            mild systemic disease. After reviewing the risks                            and benefits, the patient was deemed in                            satisfactory condition to undergo the procedure.                           After obtaining informed consent, the colonoscope  was passed under direct vision. Throughout the                            procedure, the patient's blood pressure, pulse, and                            oxygen saturations were monitored continuously. The                            Model CF-HQ190L [id number]) scope was introduced                            through the anus and advanced to the the cecum,                            identified by  appendiceal orifice and ileocecal                            valve. The patient tolerated the procedure well.                            The colonoscopy was performed with moderate                            difficulty due to significant looping. The                            ileocecal valve, appendiceal orifice, and rectum                            were photographed. The quality of the bowel                            preparation was excellent with Suprep. Scope In: 1:39:28 PM Scope Out: 1:57:52 PM Scope Withdrawal Time: 0 hours 9 minutes 29 seconds  Total Procedure Duration: 0 hours 18 minutes 24 seconds  Findings:                 The perianal and digital rectal examinations were                            normal.                           A 6 mm polyp was found in the proximal ascending                            colon. The polyp was sessile. The polyp was removed                            with a cold snare. Resection and retrieval were                            complete.  Internal hemorrhoids were found during digital exam                            and during anoscopy. The hemorrhoids were                            medium-sized and Grade II (internal hemorrhoids                            that prolapse but reduce spontaneously).                           The exam was otherwise without abnormality. Complications:            No immediate complications. Estimated Blood Loss:     Estimated blood loss: none. Impression:               - One 6 mm polyp in the proximal ascending colon,                            removed with a cold snare. Resected and retrieved.                           - Internal hemorrhoids.                           - The examination was otherwise normal. Recommendation:           - Patient has a contact number available for                            emergencies. The signs and symptoms of potential                            delayed  complications were discussed with the                            patient. Return to normal activities tomorrow.                            Written discharge instructions were provided to the                            patient.                           - Resume previous diet.                           - Continue present medications.                           - Await pathology results.                           - Repeat colonoscopy is recommended for  surveillance. The colonoscopy date will be                            determined after pathology results from today's                            exam become available for review. Casten Floren L. Loletha Carrow, MD 07/12/2016 2:09:54 PM This report has been signed electronically.

## 2016-07-13 ENCOUNTER — Telehealth: Payer: Self-pay

## 2016-07-13 NOTE — Telephone Encounter (Signed)
  Follow up Call-  Call back number 07/12/2016  Post procedure Call Back phone  # [phone]  Permission to leave phone message Yes  Some recent data might be hidden     Patient questions:  Do you have a fever, pain , or abdominal swelling? No. Pain Score  0 *  Have you tolerated food without any problems? Yes.    Have you been able to return to your normal activities? Yes.    Do you have any questions about your discharge instructions: Diet   No. Medications  No. Follow up visit  No.  Do you have questions or concerns about your Care? No.  Actions: * If pain score is 4 or above: No action needed, pain <4.  No problems per the pt. maw

## 2016-07-16 ENCOUNTER — Encounter: Payer: Self-pay | Admitting: Gastroenterology

## 2016-07-29 DIAGNOSIS — M79641 Pain in right hand: Secondary | ICD-10-CM | POA: Diagnosis not present

## 2016-08-24 ENCOUNTER — Ambulatory Visit (HOSPITAL_BASED_OUTPATIENT_CLINIC_OR_DEPARTMENT_OTHER): Payer: BLUE CROSS/BLUE SHIELD | Admitting: Hematology

## 2016-08-24 ENCOUNTER — Encounter: Payer: Self-pay | Admitting: Hematology

## 2016-08-24 ENCOUNTER — Other Ambulatory Visit (HOSPITAL_BASED_OUTPATIENT_CLINIC_OR_DEPARTMENT_OTHER): Payer: BLUE CROSS/BLUE SHIELD

## 2016-08-24 ENCOUNTER — Telehealth: Payer: Self-pay | Admitting: Hematology

## 2016-08-24 VITALS — BP 136/90 | HR 75 | Temp 98.3°F | Resp 17 | Ht 65.0 in | Wt 163.8 lb

## 2016-08-24 DIAGNOSIS — M25539 Pain in unspecified wrist: Secondary | ICD-10-CM | POA: Diagnosis not present

## 2016-08-24 DIAGNOSIS — I10 Essential (primary) hypertension: Secondary | ICD-10-CM | POA: Diagnosis not present

## 2016-08-24 DIAGNOSIS — Z170 Estrogen receptor positive status [ER+]: Principal | ICD-10-CM

## 2016-08-24 DIAGNOSIS — M25549 Pain in joints of unspecified hand: Secondary | ICD-10-CM

## 2016-08-24 DIAGNOSIS — C50212 Malignant neoplasm of upper-inner quadrant of left female breast: Secondary | ICD-10-CM | POA: Diagnosis not present

## 2016-08-24 DIAGNOSIS — E559 Vitamin D deficiency, unspecified: Secondary | ICD-10-CM

## 2016-08-24 LAB — COMPREHENSIVE METABOLIC PANEL
ALBUMIN: 3.7 g/dL (ref 3.5–5.0)
ALT: 17 U/L (ref 0–55)
ANION GAP: 10 meq/L (ref 3–11)
AST: 15 U/L (ref 5–34)
Alkaline Phosphatase: 83 U/L (ref 40–150)
BILIRUBIN TOTAL: 0.87 mg/dL (ref 0.20–1.20)
BUN: 12.9 mg/dL (ref 7.0–26.0)
CALCIUM: 10.2 mg/dL (ref 8.4–10.4)
CHLORIDE: 102 meq/L (ref 98–109)
CO2: 28 mEq/L (ref 22–29)
Creatinine: 0.7 mg/dL (ref 0.6–1.1)
EGFR: 90 mL/min/{1.73_m2} — ABNORMAL LOW (ref >90)
Glucose: 95 mg/dl (ref 70–140)
Potassium: 3.9 mEq/L (ref 3.5–5.1)
Sodium: 140 mEq/L (ref 136–145)
Total Protein: 7.4 g/dL (ref 6.4–8.3)

## 2016-08-24 LAB — CBC WITH DIFFERENTIAL/PLATELET
BASO%: 0.7 % (ref 0.0–2.0)
Basophils Absolute: 0 10*3/uL (ref 0.0–0.1)
EOS%: 1.6 % (ref 0.0–7.0)
Eosinophils Absolute: 0.1 10*3/uL (ref 0.0–0.5)
HEMATOCRIT: 42.1 % (ref 34.8–46.6)
HEMOGLOBIN: 13.7 g/dL (ref 11.6–15.9)
LYMPH#: 1.4 10*3/uL (ref 0.9–3.3)
LYMPH%: 27.9 % (ref 14.0–49.7)
MCH: 30.2 pg (ref 25.1–34.0)
MCHC: 32.6 g/dL (ref 31.5–36.0)
MCV: 92.8 fL (ref 79.5–101.0)
MONO#: 0.4 10*3/uL (ref 0.1–0.9)
MONO%: 7.2 % (ref 0.0–14.0)
NEUT%: 62.6 % (ref 38.4–76.8)
NEUTROS ABS: 3.1 10*3/uL (ref 1.5–6.5)
PLATELETS: 260 10*3/uL (ref 145–400)
RBC: 4.54 10*6/uL (ref 3.70–5.45)
RDW: 13.5 % (ref 11.2–14.5)
WBC: 5 10*3/uL (ref 3.9–10.3)

## 2016-08-24 NOTE — Progress Notes (Signed)
[place]  [phone] [phone]  Clinic follow-up Note   Patient Care Team: Midge Minium, MD as PCP - General (Family Medicine) Fanny Skates, MD as Consulting Physician (General Surgery) Truitt Merle, MD as Consulting Physician (Hematology) Thea Silversmith, MD as Consulting Physician (Radiation Oncology) Rockwell Germany, RN as Registered Nurse Mauro Kaufmann, RN as Registered Nurse Holley Bouche, NP as Nurse Practitioner (Nurse Practitioner)   CHIEF COMPLAINTS:  Follow up breast cancer  Oncology History   Breast cancer of upper-inner quadrant of left female breast   Staging form: Breast, AJCC 7th Edition     Clinical stage from 12/25/2014: Stage IIB (T3, N0, M0) - Unsigned       Staging comments: Staged at breast conference on 3.23.16      Pathologic stage from 06/11/2015: Stage IIIA (yT3, N1a, cM0) - Unsigned             Breast cancer of upper-inner quadrant of left female breast ([place])   12/09/2014 Breast US    Left breast: diffuse, large, irregular, hypoechoic mass with shadowing located superiorally extending from the 9:30 o'clock position to approximately the 2:30 o'clock position      12/13/2014 Initial Biopsy    Left breast core needle bx (11:30): invasive mammary carcinoma, MCIS; left breast bx (9:30): invasive mammary carcinoma.  ER+ (99%), PR+ (43%), HER2/neu negative, Ki67 42%.      12/13/2014 Oncotype testing    RS 27, predicted recurrent risk of 18% with tamoxifen alone      12/23/2014 Breast MRI    Extensive abnormal enhancement throughout much of the left breast suspicious for additional areas of malignancy. Overall area of this abnormal enhancement covers 8.5 cm x 7 cm x 7.1 cm. Most discrete area of abnormal enhancement lies in the up      12/25/2014 Clinical Stage    Stage IIB: T3 N0      01/24/2015 - 05/09/2015 Neo-Adjuvant Chemotherapy    Docetaxel and cyclophosphamide q 3weeks      05/17/2015 Breast  MRI    Marked interval decrease in the previously noted extensive enhancement throughout the left breast, compatible with response to treatment. No evidence of malignancy within the right breast.      06/11/2015 Surgery    Left breast simple mastectomy and sentinel lymph node biopsy. Surgical margins were negative.      06/11/2015 Pathology Results    Left breast mastectomy showed residual scattered tumor cells consistent with invasive grade 1 lobular carcinoma involving approximately 5.9 cm worse of breast parenchyma, atypical lobular hyperplasia, 1 out of 4 axillary lymph nodes were positive.       06/11/2015 Pathologic Stage    Stage IIIA: ypT3 ypN1a      07/11/2015 Imaging    PET scan showed a  hypermetabolic lesion in the posterior aspect of the lateral segment left liver. low-level diffuse FDG uptake in the anterior left chest wall , likely secondary to mastectomy.       07/17/2015 Imaging     Liver MRI with and without contrast showed tiny simple cyst, no suspicious liver masses.       08/04/2015 - 09/22/2015 Radiation Therapy    Adjuvant RT: Left chest wall / 50.4 Gray @ 1.8 Gray per fraction x 28 fractions; left supraclavicular fossa /PAB 42 Gray _0 .8 Gray per fraction x 25 fractions; left scar / 10 Gray at [employer] per fraction x 5 fractions      11/06/2015 -  Anti-estrogen oral therapy    Letrozole 2.5 mg daily. Planned duration of therapy 10 years      12/09/2015 Survivorship    Survivorship visit completed and copy of care plan given to patient        HISTORY OF PRESENTING ILLNESS:  Emily Griffith 60 y.o. female is here because of newly diagnosed left breast cancer.  This was found by screening mammogram. Her prior screening mammogram was in November 2014 which was negative. Her screening mammogram on 11/25/2014 showed a possible mass and distortion in the left breast. She further underwent diagnostic mammogram and ultrasound on 12/09/2014, which showed a diffuse large  irregular hypoechogenic mass extending from 9:30 o'clock position 2-30 o'clock position. Biopsy of this large mass at 2 different positions showed invasive lobular carcinoma.  She denies any symptoms. She feels well overall. She denies any pain, cough, dyspnea, or any GI symptoms. 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 No recent weight loss. No change of her appetite.   She had right breast biopsy was benign. She always has lumpy breast which has not changed per patient. She has IBS, she has dirrhea which is usually triggered by stress, and she takes Imodium as needed.    CURRENT THERAPY: letrozole 1 mg once daily, started on 11/06/2015  INTERIM HISTORY: Emily Griffith returns for follow-up. She is doing well overall. She has developed bilateral wrist pain, especially when she extend or flex her hands, she has shooting pain. she was seen by orthopedic surgeon, she declined cortisone injection, but agreed with splint which she wears at night. Her pain has much improved, she still has mild arthralgia on her finger joints,  manageable. No other muscular or joint discomfort, no hot flash, she is compliant with letrozole. She otherwise feels well, has good appetite and energy level, her weight is stable.   MEDICAL HISTORY:  Past Medical History:  Diagnosis Date  . Breast cancer ([place])   . Breast cancer of upper-inner quadrant of left female breast ([place]) 12/20/2014  . Chicken pox   . History of hiatal hernia   . Hyperlipidemia   . Hypertension   . IBS (irritable bowel syndrome)     SURGICAL HISTORY: Past Surgical History:  Procedure Laterality Date  . COLONOSCOPY  06/18/2005  . LASER ABLATION OF THE CERVIX  1991  . MASTECTOMY W/ SENTINEL NODE BIOPSY Left 06/11/2015  . SIMPLE MASTECTOMY WITH AXILLARY SENTINEL NODE BIOPSY Left 06/11/2015   Procedure: LEFT TOTAL MASTECTOMY WITH LEFT SENTINEL NODE BIOPSY;  Surgeon: Fanny Skates, MD;  Location: [facility];  Service:  General;  Laterality: Left;    SOCIAL HISTORY: History   Social History  . Marital Status: Married    Spouse Name: N/A  . Number of Children: 0  . Years of Education: N/A   Occupational History  . 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 Smoking status: Never Smoker   . Smokeless tobacco: Not on file  . Alcohol Use: Yes     Comment: social   . Drug Use: No  . Sexual Activity: Not on file   Other Topics Concern  . Not on file   Social History Narrative   GYN HISTORY  Menarchal: 11 LMP: 03/2013  Contraceptive: no HRT: no  G0P0:    FAMILY HISTORY: Family History  Problem Relation Age of Onset  . Hypertension Mother   . Sudden death Father 38  bleeding   . Cancer Maternal Uncle 60    unknown cancer   . Cancer Paternal Uncle 84    unknown cancer   . Colon cancer Neg Hx     ALLERGIES:  is allergic to lactose intolerance (gi).  MEDICATIONS:  Current Outpatient Prescriptions  Medication Sig Dispense Refill  . atorvastatin (LIPITOR) 40 MG tablet TAKE 1 TABLET EVERY NIGHT AT BEDTIME 90 tablet 1  . Cholecalciferol (VITAMIN D) 2000 UNITS CAPS Take 2,000 Units by mouth daily.     Marland Kitchen dicyclomine (BENTYL) 20 MG tablet TAKE 1 TABLET BY MOUTH EVERY 6 HOURS 60 tablet 6  . letrozole (FEMARA) 2.5 MG tablet Take 1 tablet (2.5 mg total) by mouth daily. 90 tablet 4  . lisinopril-hydrochlorothiazide (PRINZIDE,ZESTORETIC) 20-12.5 MG tablet Take 1 tablet by mouth daily. 90 tablet 1  . OVER THE COUNTER MEDICATION 2 calcium gummies    . potassium chloride SA (K-DUR,KLOR-CON) 20 MEQ tablet TAKE 1 TABLET DAILY 90 tablet 1  . meloxicam (MOBIC) 15 MG tablet      Current Facility-Administered Medications  Medication Dose Route Frequency Provider Last Rate Last Dose  . 0.9 %  sodium chloride infusion  500 mL Intravenous Continuous Nelida Meuse III, MD        REVIEW OF SYSTEMS:   Constitutional: Denies fevers, chills or abnormal night sweats Eyes:  Denies blurriness of vision, double vision or watery eyes Ears, nose, mouth, throat, and face: Denies mucositis or sore throat Respiratory: Denies cough, dyspnea or wheezes Cardiovascular: Denies palpitation, chest discomfort or lower extremity swelling Gastrointestinal:  Denies nausea, heartburn or change in bowel habits Skin: Denies abnormal skin rashes Lymphatics: Denies new lymphadenopathy or easy bruising Neurological:Denies numbness, tingling or new weaknesses Behavioral/Psych: Mood is stable, no new changes  All other systems were reviewed with the patient and are negative.  PHYSICAL EXAMINATION: ECOG PERFORMANCE STATUS: 0 - Asymptomatic  Vitals:   08/24/16 1401  BP: 136/90  Pulse: 75  Resp: 17  Temp: 98.3 F (36.8 C)   Filed Weights   08/24/16 1401  Weight: 163 lb 12.8 oz (74.3 kg)    GENERAL:alert, no distress and comfortable SKIN: skin color, texture, turgor are normal, no rashes or significant lesions EYES: normal, conjunctiva are pink and non-injected, sclera clear OROPHARYNX:no exudate, no erythema and lips, buccal mucosa, and tongue normal  NECK: supple, thyroid normal size, non-tender, without nodularity LYMPH:  no palpable lymphadenopathy in the cervical, axillary or inguinal LUNGS: clear to auscultation and percussion with normal breathing effort HEART: regular rate & rhythm and no murmurs and no lower extremity edema ABDOMEN:abdomen soft, non-tender and normal bowel sounds Musculoskeletal:no cyanosis of digits and no clubbing  PSYCH: alert & oriented x 3 with fluent speech NEURO: no focal motor/sensory deficits Breasts: Breast inspection showed them to be symmetrical with no nipple discharge. Left breast is surgically absent, no skin pigmentation, no palpable nodules on the left chest wall. Palpitation of the right breast and axilla revealed no obvious mass that I could appreciate.  Ext: No significant joint edema or tenderness   LABORATORY DATA:  I have  reviewed the data as listed CBC Latest Ref Rng & Units 08/24/2016 06/24/2016 06/10/2016  WBC 3.9 - 10.3 10e3/uL 5.0 4.8 6.2  Hemoglobin 11.6 - 15.9 g/dL 13.7 14.1 14.5  Hematocrit 34.8 - 46.6 % 42.1 42.4 42.1  Platelets 145 - 400 10e3/uL 260 287 285.0    CMP Latest Ref Rng & Units 08/24/2016 06/24/2016 06/10/2016  Glucose 70 - 140  mg/dl 95 71 99  BUN 7.0 - 26.0 mg/dL 12.9 14.6 11  Creatinine 0.6 - 1.1 mg/dL 0.7 0.7 0.60  Sodium 136 - 145 mEq/L 140 141 139  Potassium 3.5 - 5.1 mEq/L 3.9 3.7 3.6  Chloride 96 - 112 mEq/L - - 102  CO2 22 - 29 mEq/L _0 Calcium 8.4 - 10.4 mg/dL 10.2 10.0 9.7  Total Protein 6.4 - 8.3 g/dL 7.4 7.5 7.3  Total Bilirubin 0.20 - 1.20 mg/dL 0.87 0.63 0.8  Alkaline Phos 40 - 150 U/L 83 84 75  AST 5 - 34 U/L _1 ALT 0 - 55 U/L _2 Results for Emily, Griffith (MRN [id number]) as of 08/24/2016 14:52  Ref. Range 04/23/2016 12:31 06/24/2016 12:01  CA 27.29 Latest Ref Range: 0.0 - 38.6 U/mL 40.6 (H) 38.4    PATHOLOGY REPORT 12/13/2014 Diagnosis 06/11/2015 1. Breast, simple mastectomy, Left - RESIDUAL SCATTERED TUMOR CELLS CONSISTENT WITH INVASIVE GRADE I LOBULAR CARCINOMA INVOLVING APPROXIMATELY 5.9 CM WORTH OF BREAST PARENCHYMA. - LOBULAR NEOPLASIA (ATYPICAL LOBULAR HYPERPLASIA). - SCATTERED CALCIFICATIONS PRESENT. - MARGINS ARE NEGATIVE. - ONE BENIGN LYMPH NODE WITH NO TUMOR SEEN (0/1). - SEE ONCOLOGY TEMPLATE. 2. Lymph node, sentinel, biopsy, Left axillary #1 - ONE BENIGN LYMPH NODE WITH NO TUMOR SEEN (0/1). 3. Lymph node, sentinel, biopsy, Left axillary #2 - ONE BENIGN LYMPH NODE WITH NO TUMOR SEEN (0/1). 4. Lymph node, sentinel, biopsy, Left axillary #3 - ONE LYMPH NODE POSITIVE FOR METASTATIC LOBULAR CARCINOMA (1/1).  Microscopic Comment 1. BREAST, INVASIVE TUMOR, WITH LYMPH NODES PRESENT Specimen, including laterality and lymph node sampling (sentinel, non-sentinel): Left breast with sentinel lymph node sampling. Procedure: Left  mastectomy with left sentinel lymph node biopsies. Histologic type: Invasive lobular carcinoma. Grade: I. Tubule formation: 3. Nuclear pleomorphism: 1. Mitotic: 1. Tumor size: (based on gross measurement) Scattered tumor cells involve approximately 5.9 cm worth of breast parenchyma. Margins: Invasive, distance to closest margin: At least 0.4 cm (all margins). In-situ, distance to closest margin: At least 0.4 cm (all margin). Lymphovascular invasion: Definitive lymph/vascular invasion is not identified, however tumor is present within a lymph node tissue, see below. Ductal carcinoma in situ: Not identified. Lobular neoplasia: Yes, atypical lobular hyperplasia is present. Tumor focality: Unifocal. Treatment effect: Extensive therapy related changes are present throughout the tumor mass with only scattered residual tumor cells present. The scattered cells involve a 5.9 cm (approximate measurement) area of indurated ill defined soft tissue. Significant treatment effect is not identified within the lymph nodes. Extent of tumor: Skin: Not involved. Nipple: Tumor involves nipple ducts underlying the surface. Skeletal muscle: Not received. Lymph nodes: Examined: 3 Sentinel. 1 Non-sentinel. 4 Total. Lymph nodes with metastasis: 1. Isolated tumor cells (< 0.2 mm): 0. Micrometastasis: (> 0.2 mm and < 2.0 mm): 0. Macrometastasis: (> 2.0 mm): 1. Extracapsular extension: Not identified. Breast prognostic profile: Performed on previous case [phone] 1) Left 11:30 o'clock biopsy ([id number], part 1). Estrogen receptor: 99% positive. Progesterone receptor: 43% positive. Her 2 neu: 1.84 ratio, negative. Ki-67: 42%. 2) Left 9:30 biopsy ([id number], part 2) Estrogen receptor: 99%, positive. Progesterone receptor: 71%, positive. Her 2 neu: 1.80 ratio, negative. Ki-67: 33%. Additional breast findings: Fibrocystic changes with focal usual ducal hyperplasia; benign ducts with  calcifications; stromal calcifications present; neoadjuvant changes. TNM: ypT3, ypN1a. Comments: A cytokeratin AE1/AE3 immunostain is performed on all lymph node tissue (5 stains total). The staining pattern is compatible with the above findings. Dr. Lyndon Code is in agreement  that the tumor is best staged as a ypT3 tumor. As Her-2 neu was previously negative, this will be repeated on a representative slide with tumor and reported in an addendum to follow. (RH;kh 06-12-15)   1. FLUORESCENCE IN-SITU HYBRIDIZATION Results: HER2 - NEGATIVE RATIO OF HER2/CEP17 SIGNALS 1.74 AVERAGE HER2 COPY NUMBER PER CELL 3.30  RADIOGRAPHIC STUDIES: I have personally reviewed the radiological images as listed and agreed with the findings in the report.  Breast MRI 05/17/2059 IMPRESSION: 1. Marked interval decrease in the previously noted extensive enhancement throughout the left breast, compatible with response to treatment. No evidence of malignancy within the right breast. 2. Nonspecific focus of skin enhancement along the medial aspect of the left breast. 3. Incidentally noted hiatal hernia with possible asymmetric wall thickening.   ASSESSMENT & PLAN:  60 yo female, with PMH of  Hypertension and a mild IBS , otherwise very fit and healthy postmenopausal woman , who was found to have left breast cancer by screening mammogram.  1. CT3N0M0,  Stage IIB,  Invasive lobular carcinoma,  Strongly ER and PR positive, HER-2 negative, Ki67 33-42%, and lobular carcinoma in situ, ypT3N1aMx, pathological stage IIIA after neoadjuvant chemo  -She received neoadjuvant 6 cycle of TC, followed by left mastectomy and sentinel lymph node biopsy, surgical margins were negative. -I discussed her surgical pathology findings with patient and her husband in great details. Although she had significant treatment effect on the primary breast tumor (residual tumor cells are scattered), she was unfortunately found to have axillary lymph  node metastasis. -Her surgical pathology findings were discussed in the tumor board, Dr. Dalbert Batman recommends against axillary lymph node dissection, and  she received adjuvant radiation -Her PET scan was negative for distant metastasis, this was reviewed with patient and her husband.   -she is on adjuvant letrozole, tolerating well, we'll continue. Plan for total of 10 years. -we'll continue breast cancer surveillance. She will be due for annual mammogram next March. She'll continue self exam, and follow-up with [place] routinely.  2. Arthralgia  -Mainly at her wrists and finger joints, possible related to letrozole, versus degenerative arthritis  -Improved with splints, continue monitoring  -I encouraged her to exercise   3.  Hypertension - continue medication. Follow-up of his primary care physician   Plan: -Lab result reviewed with her, normal CBC and CMP, CA27.29 is still pending  -continue letrozole -Return to clinic in 4 months for follow-up  The patient knows to call the clinic with any problems, questions or concerns.  I spent 15 minutes counseling the patient face to face. The total time spent in the appointment was 20 minutes and more than 50% was on counseling.     Truitt Merle, MD 08/24/2016   2:49 PM

## 2016-08-24 NOTE — Telephone Encounter (Signed)
Appointments scheduled per 11/21 LOS. Patient given AVS report and calendars with future scheduled appointments. Patient requested to have appointment scheduled for 12/14/2016 per availability and travel.

## 2016-08-25 LAB — CANCER ANTIGEN 27.29: CAN 27.29: 37.2 U/mL (ref 0.0–38.6)

## 2016-10-01 ENCOUNTER — Other Ambulatory Visit: Payer: Self-pay | Admitting: Nurse Practitioner

## 2016-10-05 DIAGNOSIS — I10 Essential (primary) hypertension: Secondary | ICD-10-CM | POA: Diagnosis not present

## 2016-10-05 DIAGNOSIS — R05 Cough: Secondary | ICD-10-CM | POA: Diagnosis not present

## 2016-10-25 ENCOUNTER — Other Ambulatory Visit: Payer: Self-pay | Admitting: Hematology

## 2016-10-25 DIAGNOSIS — Z1231 Encounter for screening mammogram for malignant neoplasm of breast: Secondary | ICD-10-CM

## 2016-11-25 ENCOUNTER — Encounter: Payer: Self-pay | Admitting: Hematology

## 2016-11-25 ENCOUNTER — Encounter: Payer: BLUE CROSS/BLUE SHIELD | Admitting: Family Medicine

## 2016-11-29 ENCOUNTER — Encounter: Payer: Self-pay | Admitting: Family Medicine

## 2016-12-06 NOTE — Progress Notes (Addendum)
[place]  [phone] [phone]  Clinic follow-up Note   Patient Care Team: Midge Minium, MD as PCP - General (Family Medicine) Fanny Skates, MD as Consulting Physician (General Surgery) Truitt Merle, MD as Consulting Physician (Hematology) Thea Silversmith, MD as Consulting Physician (Radiation Oncology) Rockwell Germany, RN as Registered Nurse Mauro Kaufmann, RN as Registered Nurse Holley Bouche, NP as Nurse Practitioner (Nurse Practitioner)   CHIEF COMPLAINTS:  Follow up breast cancer  Oncology History   Breast cancer of upper-inner quadrant of left female breast   Staging form: Breast, AJCC 7th Edition     Clinical stage from 12/25/2014: Stage IIB (T3, N0, M0) - Unsigned       Staging comments: Staged at breast conference on 3.23.16      Pathologic stage from 06/11/2015: Stage IIIA (yT3, N1a, cM0) - Unsigned             Breast cancer of upper-inner quadrant of left female breast ([place])   12/09/2014 Breast US    Left breast: diffuse, large, irregular, hypoechoic mass with shadowing located superiorally extending from the 9:30 o'clock position to approximately the 2:30 o'clock position      12/13/2014 Initial Biopsy    Left breast core needle bx (11:30): invasive mammary carcinoma, MCIS; left breast bx (9:30): invasive mammary carcinoma.  ER+ (99%), PR+ (43%), HER2/neu negative, Ki67 42%.      12/13/2014 Oncotype testing    RS 27, predicted recurrent risk of 18% with tamoxifen alone      12/23/2014 Breast MRI    Extensive abnormal enhancement throughout much of the left breast suspicious for additional areas of malignancy. Overall area of this abnormal enhancement covers 8.5 cm x 7 cm x 7.1 cm. Most discrete area of abnormal enhancement lies in the up      12/25/2014 Clinical Stage    Stage IIB: T3 N0      01/24/2015 - 05/09/2015 Neo-Adjuvant Chemotherapy    Docetaxel and cyclophosphamide q 3weeks      05/17/2015 Breast  MRI    Marked interval decrease in the previously noted extensive enhancement throughout the left breast, compatible with response to treatment. No evidence of malignancy within the right breast.      06/11/2015 Surgery    Left breast simple mastectomy and sentinel lymph node biopsy. Surgical margins were negative.      06/11/2015 Pathology Results    Left breast mastectomy showed residual scattered tumor cells consistent with invasive grade 1 lobular carcinoma involving approximately 5.9 cm worse of breast parenchyma, atypical lobular hyperplasia, 1 out of 4 axillary lymph nodes were positive.       06/11/2015 Pathologic Stage    Stage IIIA: ypT3 ypN1a      07/11/2015 Imaging    PET scan showed a  hypermetabolic lesion in the posterior aspect of the lateral segment left liver. low-level diffuse FDG uptake in the anterior left chest wall , likely secondary to mastectomy.       07/17/2015 Imaging     Liver MRI with and without contrast showed tiny simple cyst, no suspicious liver masses.       08/04/2015 - 09/22/2015 Radiation Therapy    Adjuvant RT: Left chest wall / 50.4 Gray @ 1.8 Gray per fraction x 28 fractions; left supraclavicular fossa /PAB 62 Gray @1 .8 Gray per fraction x 25 fractions; left scar / 10 Gray at [employer] per fraction x 5 fractions      11/06/2015 - 11/25/2016  Anti-estrogen oral therapy    Letrozole 2.5 mg daily. Planned duration of therapy 10 years. Stopped on 11/25/16 due to joint pain.      12/09/2015 Survivorship    Survivorship visit completed and copy of care plan given to patient      12/15/2015 Mammogram    SCREENING MAMMOGRAM RIGHT 12/15/15 FINDINGS: In the right breast, a possible mass with possible distortion warrants further evaluation. In the left breast, no findings suspicious for malignancy. Images were processed with CAD. IMPRESSION: Further evaluation is suggested for possible mass with distortion in the right breast.      12/25/2015 Mammogram     DIAGNOSTIC RIGHT MAMMOGRAM AND [place] 12/25/15 IMPRESSION: Benign findings in the right breast. No evidence of right breast malignancy. RECOMMENDATION: Screening mammogram in one year.(Code:[id number])      06/17/2016 Imaging    DG BONE DENSITY 06/17/16 ASSESSMENT: The BMD measured at Femur Neck Right is 0.860 g/cm2 with a T-score of -1.3. This patient is considered osteopenic according to [facility]) criteria. Lumbar spine was not utilized due to advanced degenerative changes.        HISTORY OF PRESENTING ILLNESS:  Emily Griffith 61 y.o. female is here because of newly diagnosed left breast cancer.  This was found by screening mammogram. Her prior screening mammogram was in November 2014 which was negative. Her screening mammogram on 11/25/2014 showed a possible mass and distortion in the left breast. She further underwent diagnostic mammogram and ultrasound on 12/09/2014, which showed a diffuse large irregular hypoechogenic mass extending from 9:30 o'clock position 2-30 o'clock position. Biopsy of this large mass at 2 different positions showed invasive lobular carcinoma.  She denies any symptoms. She feels well overall. She denies any pain, cough, dyspnea, or any GI symptoms. 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 No recent weight loss. No change of her appetite.   She had right breast biopsy was benign. She always has lumpy breast which has not changed per patient. She has IBS, she has dirrhea which is usually triggered by stress, and she takes Imodium as needed.    CURRENT THERAPY: Surveillance  INTERIM HISTORY: Takeesha returns for follow-up. The patient stopped Letrozole on 11/25/16 due to joint paint. She has noticed the pain is in her bilateral wrists and not her knees or feet. She feels the pain is better after stopping. She reports it has improved by 50-60%. She states the pain doesn't wake her up at night. To help, she is  wearing wrist straps. She denies hot flashes. The patient has moved to [place] and has come back to pick up her car and sell her house.   MEDICAL HISTORY:  Past Medical History:  Diagnosis Date  . Breast cancer ([place])   . Breast cancer of upper-inner quadrant of left female breast ([place]) 12/20/2014  . Chicken pox   . History of hiatal hernia   . Hyperlipidemia   . Hypertension   . IBS (irritable bowel syndrome)     SURGICAL HISTORY: Past Surgical History:  Procedure Laterality Date  . COLONOSCOPY  06/18/2005  . LASER ABLATION OF THE CERVIX  1991  . MASTECTOMY W/ SENTINEL NODE BIOPSY Left 06/11/2015  . SIMPLE MASTECTOMY WITH AXILLARY SENTINEL NODE BIOPSY Left 06/11/2015   Procedure: LEFT TOTAL MASTECTOMY WITH LEFT SENTINEL NODE BIOPSY;  Surgeon: Fanny Skates, MD;  Location: [facility];  Service: General;  Laterality: Left;    SOCIAL HISTORY: History  Social History  . Marital Status: Married    Spouse Name: N/A  . Number of Children: 0  . Years of Education: N/A   Occupational History  . 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 Smoking status: Never Smoker   . Smokeless tobacco: Not on file  . Alcohol Use: Yes     Comment: social   . Drug Use: No  . Sexual Activity: Not on file   Other Topics Concern  . Not on file   Social History Narrative   GYN HISTORY  Menarchal: 11 LMP: 03/2013  Contraceptive: no HRT: no  G0P0:    FAMILY HISTORY: Family History  Problem Relation Age of Onset  . Hypertension Mother   . Sudden death Father 30    bleeding   . Cancer Maternal Uncle 60    unknown cancer   . Cancer Paternal Uncle 76    unknown cancer   . Colon cancer Neg Hx     ALLERGIES:  is allergic to lactose intolerance (gi).  MEDICATIONS:  Current Outpatient Prescriptions  Medication Sig Dispense Refill  . atorvastatin (LIPITOR) 40 MG tablet TAKE 1 TABLET EVERY NIGHT AT BEDTIME 90 tablet 1  . Cholecalciferol (VITAMIN D) 2000  UNITS CAPS Take 2,000 Units by mouth daily.     Marland Kitchen dicyclomine (BENTYL) 20 MG tablet TAKE 1 TABLET BY MOUTH EVERY 6 HOURS 60 tablet 6  . lisinopril-hydrochlorothiazide (PRINZIDE,ZESTORETIC) 20-12.5 MG tablet Take 1 tablet by mouth daily. 90 tablet 1  . meloxicam (MOBIC) 15 MG tablet Take 15 mg by mouth as needed.     Marland Kitchen OVER THE COUNTER MEDICATION 2 calcium gummies    . potassium chloride SA (K-DUR,KLOR-CON) 20 MEQ tablet TAKE 1 TABLET DAILY 90 tablet 1  . letrozole (FEMARA) 2.5 MG tablet Take 1 tablet (2.5 mg total) by mouth daily. (Patient not taking: Reported on 12/14/2016) 90 tablet 4   Current Facility-Administered Medications  Medication Dose Route Frequency Provider Last Rate Last Dose  . 0.9 %  sodium chloride infusion  500 mL Intravenous Continuous Nelida Meuse III, MD        REVIEW OF SYSTEMS:   Constitutional: Denies fevers, chills or abnormal night sweats Eyes: Denies blurriness of vision, double vision or watery eyes Ears, nose, mouth, throat, and face: Denies mucositis or sore throat Respiratory: Denies cough, dyspnea or wheezes Cardiovascular: Denies palpitation, chest discomfort or lower extremity swelling Gastrointestinal:  Denies nausea, heartburn or change in bowel habits Skin: Denies abnormal skin rashes Lymphatics: Denies new lymphadenopathy or easy bruising Neurological:Denies numbness, tingling or new weaknesses Behavioral/Psych: Mood is stable, no new changes  MSK: (+) Bilateral wrist pain. All other systems were reviewed with the patient and are negative.  PHYSICAL EXAMINATION: ECOG PERFORMANCE STATUS: 0 - Asymptomatic  Vitals:   12/14/16 1048  BP: 136/75  Pulse: 72  Resp: 18  Temp: 98.7 F (37.1 C)   Filed Weights   12/14/16 1048  Weight: 169 lb 3.2 oz (76.7 kg)    GENERAL:alert, no distress and comfortable SKIN: skin color, texture, turgor are normal, no rashes or significant lesions EYES: normal, conjunctiva are pink and non-injected, sclera  clear OROPHARYNX:no exudate, no erythema and lips, buccal mucosa, and tongue normal  NECK: supple, thyroid normal size, non-tender, without nodularity LYMPH:  no palpable lymphadenopathy in the cervical, axillary or inguinal LUNGS: clear to auscultation and percussion with normal breathing effort HEART: regular rate & rhythm and no murmurs and  no lower extremity edema ABDOMEN:abdomen soft, non-tender and normal bowel sounds Musculoskeletal:no cyanosis of digits and no clubbing, she wares wrist braces  PSYCH: alert & oriented x 3 with fluent speech NEURO: no focal motor/sensory deficits Breasts: Breast inspection showed them to be symmetrical with no nipple discharge. Left breast is surgically absent, no skin pigmentation, no palpable nodules on the left chest wall. Palpitation of the right breast and axilla revealed no obvious mass that I could appreciate.  Ext: No significant joint edema or tenderness   LABORATORY DATA:  I have reviewed the data as listed CBC Latest Ref Rng & Units 12/14/2016 08/24/2016 06/24/2016  WBC 3.9 - 10.3 10e3/uL 4.7 5.0 4.8  Hemoglobin 11.6 - 15.9 g/dL 13.9 13.7 14.1  Hematocrit 34.8 - 46.6 % 41.1 42.1 42.4  Platelets 145 - 400 10e3/uL 260 260 287    CMP Latest Ref Rng & Units 12/14/2016 08/24/2016 06/24/2016  Glucose 70 - 140 mg/dl 107 95 71  BUN 7.0 - 26.0 mg/dL 16.9 12.9 14.6  Creatinine 0.6 - 1.1 mg/dL 0.8 0.7 0.7  Sodium 136 - 145 mEq/L 142 140 141  Potassium 3.5 - 5.1 mEq/L 4.2 3.9 3.7  Chloride 96 - 112 mEq/L - - -  CO2 22 - 29 mEq/L 27 28 28   Calcium 8.4 - 10.4 mg/dL 10.5(H) 10.2 10.0  Total Protein 6.4 - 8.3 g/dL 7.5 7.4 7.5  Total Bilirubin 0.20 - 1.20 mg/dL 0.88 0.87 0.63  Alkaline Phos 40 - 150 U/L 70 83 84  AST 5 - 34 U/L 17 15 17   ALT 0 - 55 U/L 19 17 20    Results for DEBBERA, WOLKEN (MRN [id number]) as of 08/24/2016 14:52  Ref. Range 04/23/2016 12:31 06/24/2016 12:01  CA 27.29 Latest Ref Range: 0.0 - 38.6 U/mL 40.6 (H) 38.4    PATHOLOGY  REPORT 12/13/2014 Diagnosis 06/11/2015 1. Breast, simple mastectomy, Left - RESIDUAL SCATTERED TUMOR CELLS CONSISTENT WITH INVASIVE GRADE I LOBULAR CARCINOMA INVOLVING APPROXIMATELY 5.9 CM WORTH OF BREAST PARENCHYMA. - LOBULAR NEOPLASIA (ATYPICAL LOBULAR HYPERPLASIA). - SCATTERED CALCIFICATIONS PRESENT. - MARGINS ARE NEGATIVE. - ONE BENIGN LYMPH NODE WITH NO TUMOR SEEN (0/1). - SEE ONCOLOGY TEMPLATE. 2. Lymph node, sentinel, biopsy, Left axillary #1 - ONE BENIGN LYMPH NODE WITH NO TUMOR SEEN (0/1). 3. Lymph node, sentinel, biopsy, Left axillary #2 - ONE BENIGN LYMPH NODE WITH NO TUMOR SEEN (0/1). 4. Lymph node, sentinel, biopsy, Left axillary #3 - ONE LYMPH NODE POSITIVE FOR METASTATIC LOBULAR CARCINOMA (1/1).  Microscopic Comment 1. BREAST, INVASIVE TUMOR, WITH LYMPH NODES PRESENT Specimen, including laterality and lymph node sampling (sentinel, non-sentinel): Left breast with sentinel lymph node sampling. Procedure: Left mastectomy with left sentinel lymph node biopsies. Histologic type: Invasive lobular carcinoma. Grade: I. Tubule formation: 3. Nuclear pleomorphism: 1. Mitotic: 1. Tumor size: (based on gross measurement) Scattered tumor cells involve approximately 5.9 cm worth of breast parenchyma. Margins: Invasive, distance to closest margin: At least 0.4 cm (all margins). In-situ, distance to closest margin: At least 0.4 cm (all margin). Lymphovascular invasion: Definitive lymph/vascular invasion is not identified, however tumor is present within a lymph node tissue, see below. Ductal carcinoma in situ: Not identified. Lobular neoplasia: Yes, atypical lobular hyperplasia is present. Tumor focality: Unifocal. Treatment effect: Extensive therapy related changes are present throughout the tumor mass with only scattered residual tumor cells present. The scattered cells involve a 5.9 cm (approximate measurement) area of indurated ill defined soft tissue. Significant treatment  effect is not identified within the lymph nodes. Extent  of tumor: Skin: Not involved. Nipple: Tumor involves nipple ducts underlying the surface. Skeletal muscle: Not received. Lymph nodes: Examined: 3 Sentinel. 1 Non-sentinel. 4 Total. Lymph nodes with metastasis: 1. Isolated tumor cells (< 0.2 mm): 0. Micrometastasis: (> 0.2 mm and < 2.0 mm): 0. Macrometastasis: (> 2.0 mm): 1. Extracapsular extension: Not identified. Breast prognostic profile: Performed on previous case [phone] 1) Left 11:30 o'clock biopsy ([id number], part 1). Estrogen receptor: 99% positive. Progesterone receptor: 43% positive. Her 2 neu: 1.84 ratio, negative. Ki-67: 42%. 2) Left 9:30 biopsy ([id number], part 2) Estrogen receptor: 99%, positive. Progesterone receptor: 71%, positive. Her 2 neu: 1.80 ratio, negative. Ki-67: 33%. Additional breast findings: Fibrocystic changes with focal usual ducal hyperplasia; benign ducts with calcifications; stromal calcifications present; neoadjuvant changes. TNM: ypT3, ypN1a. Comments: A cytokeratin AE1/AE3 immunostain is performed on all lymph node tissue (5 stains total). The staining pattern is compatible with the above findings. Dr. Lyndon Code is in agreement that the tumor is best staged as a ypT3 tumor. As Her-2 neu was previously negative, this will be repeated on a representative slide with tumor and reported in an addendum to follow. (RH;kh 06-12-15)   1. FLUORESCENCE IN-SITU HYBRIDIZATION Results: HER2 - NEGATIVE RATIO OF HER2/CEP17 SIGNALS 1.74 AVERAGE HER2 COPY NUMBER PER CELL 3.30  RADIOGRAPHIC STUDIES: I have personally reviewed the radiological images as listed and agreed with the findings in the report.  DG BONE DENSITY 06/17/16 ASSESSMENT: The BMD measured at Femur Neck Right is 0.860 g/cm2 with a T-score of -1.3. This patient is considered osteopenic according to [facility]) criteria. Lumbar spine was not utilized due to  advanced degenerative changes.  Breast MRI 05/17/2015 IMPRESSION: 1. Marked interval decrease in the previously noted extensive enhancement throughout the left breast, compatible with response to treatment. No evidence of malignancy within the right breast. 2. Nonspecific focus of skin enhancement along the medial aspect of the left breast. 3. Incidentally noted hiatal hernia with possible asymmetric wall thickening.   ASSESSMENT & PLAN:  61 y.o. female, with PMH of  Hypertension and a mild IBS , otherwise very fit and healthy postmenopausal woman , who was found to have left breast cancer by screening mammogram.  1. CT3N0M0,  Stage IIB,  Invasive lobular carcinoma,  Strongly ER and PR positive, HER-2 negative, Ki67 33-42%, and lobular carcinoma in situ, ypT3N1aMx, pathological stage IIIA after neoadjuvant chemo  -She received neoadjuvant 6 cycle of TC, followed by left mastectomy and sentinel lymph node biopsy, surgical margins were negative. -I discussed her surgical pathology findings with patient and her husband in great details. 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 -Her surgical pathology findings were discussed in the tumor board, Dr. Dalbert Batman recommends against axillary lymph node dissection, and  she received adjuvant radiation -Her PET scan was negative for distant metastasis, this was reviewed with patient and her husband.   -She was on adjuvant letrozole. Taken from February 2017 - February 2018, stopped due to worsening bilateral wrist pain. -We discussed switching the patient to another anti-estrogen medication, such as exemestane or tamoxifen. Potential side effects of this medications were reviewed with her. She is willing to try exemestane in a few months when her joint pain resolves. -we'll continue breast cancer surveillance. She is  scheduled to have mammogram later this week. She'll continue self exam, and routine FOLLOW-up. -She has moved to [facility] in  [place], I will refer her to local oncologist.   2. Arthralgia  -Mainly at her wrists and finger joints, possible related to letrozole, versus degenerative arthritis  -The patient has progressed and gotten to the point it keeps her up at night. -Letrozole discontinued in February 2018 and her pain is improving.  3.  Hypertension - continue medication. Follow-up of his primary care physician  4. Osteopenia -Bone scan 06/17/16 showed the patient is osteopenic. I advised the patent to take Calcium and Vitamin D. -Stop Calcium due to hypercalcemia 10.5 on 12/14/16 and reduce Vitamin D from 2000 units to 1000 units.  Plan: -continue holding letrozole. She is wiling to try exemestane in a few months.  -Lab result reviewed with her, she has developed mild hypercalcemia. Stop calcium and reduce vitamin D from 2000 units to 1000 units. -The patient recently moved to [facility], [place]. She would like to establish care there. I will refer her to Oda Cogan, MD. No more follow up here.  The patient knows to call the clinic with any problems, questions or concerns.  I spent 25  minutes counseling the patient face to face. The total time spent in the appointment was 30 minutes and more than 50% was on counseling.     Truitt Merle, MD 12/14/2016     This document serves as a record of services personally performed by Truitt Merle, MD. It was created on her behalf by Darcus Austin, a trained medical scribe. The creation of this record is based on the scribe's personal observations and the provider's statements to them. This document has been checked and approved by the attending provider.

## 2016-12-07 ENCOUNTER — Other Ambulatory Visit: Payer: Self-pay | Admitting: Family Medicine

## 2016-12-14 ENCOUNTER — Other Ambulatory Visit (HOSPITAL_BASED_OUTPATIENT_CLINIC_OR_DEPARTMENT_OTHER): Payer: BLUE CROSS/BLUE SHIELD

## 2016-12-14 ENCOUNTER — Encounter: Payer: Self-pay | Admitting: Hematology

## 2016-12-14 ENCOUNTER — Ambulatory Visit (HOSPITAL_BASED_OUTPATIENT_CLINIC_OR_DEPARTMENT_OTHER): Payer: BLUE CROSS/BLUE SHIELD | Admitting: Hematology

## 2016-12-14 VITALS — BP 136/75 | HR 72 | Temp 98.7°F | Resp 18 | Ht 65.0 in | Wt 169.2 lb

## 2016-12-14 DIAGNOSIS — E559 Vitamin D deficiency, unspecified: Secondary | ICD-10-CM

## 2016-12-14 DIAGNOSIS — H40023 Open angle with borderline findings, high risk, bilateral: Secondary | ICD-10-CM | POA: Diagnosis not present

## 2016-12-14 DIAGNOSIS — M8589 Other specified disorders of bone density and structure, multiple sites: Secondary | ICD-10-CM | POA: Diagnosis not present

## 2016-12-14 DIAGNOSIS — Z170 Estrogen receptor positive status [ER+]: Secondary | ICD-10-CM | POA: Diagnosis not present

## 2016-12-14 DIAGNOSIS — I10 Essential (primary) hypertension: Secondary | ICD-10-CM

## 2016-12-14 DIAGNOSIS — C50212 Malignant neoplasm of upper-inner quadrant of left female breast: Secondary | ICD-10-CM

## 2016-12-14 DIAGNOSIS — H2513 Age-related nuclear cataract, bilateral: Secondary | ICD-10-CM | POA: Diagnosis not present

## 2016-12-14 LAB — COMPREHENSIVE METABOLIC PANEL
ALT: 19 U/L (ref 0–55)
ANION GAP: 10 meq/L (ref 3–11)
AST: 17 U/L (ref 5–34)
Albumin: 4 g/dL (ref 3.5–5.0)
Alkaline Phosphatase: 70 U/L (ref 40–150)
BILIRUBIN TOTAL: 0.88 mg/dL (ref 0.20–1.20)
BUN: 16.9 mg/dL (ref 7.0–26.0)
CALCIUM: 10.5 mg/dL — AB (ref 8.4–10.4)
CHLORIDE: 104 meq/L (ref 98–109)
CO2: 27 mEq/L (ref 22–29)
Creatinine: 0.8 mg/dL (ref 0.6–1.1)
EGFR: 85 mL/min/{1.73_m2} — AB (ref >90)
Glucose: 107 mg/dl (ref 70–140)
Potassium: 4.2 mEq/L (ref 3.5–5.1)
Sodium: 142 mEq/L (ref 136–145)
Total Protein: 7.5 g/dL (ref 6.4–8.3)

## 2016-12-14 LAB — CBC WITH DIFFERENTIAL/PLATELET
BASO%: 0.8 % (ref 0.0–2.0)
BASOS ABS: 0 10*3/uL (ref 0.0–0.1)
EOS ABS: 0.1 10*3/uL (ref 0.0–0.5)
EOS%: 1.1 % (ref 0.0–7.0)
HEMATOCRIT: 41.1 % (ref 34.8–46.6)
HGB: 13.9 g/dL (ref 11.6–15.9)
LYMPH#: 1.1 10*3/uL (ref 0.9–3.3)
LYMPH%: 23.2 % (ref 14.0–49.7)
MCH: 31.2 pg (ref 25.1–34.0)
MCHC: 33.9 g/dL (ref 31.5–36.0)
MCV: 92.1 fL (ref 79.5–101.0)
MONO#: 0.3 10*3/uL (ref 0.1–0.9)
MONO%: 6.5 % (ref 0.0–14.0)
NEUT#: 3.2 10*3/uL (ref 1.5–6.5)
NEUT%: 68.4 % (ref 38.4–76.8)
PLATELETS: 260 10*3/uL (ref 145–400)
RBC: 4.46 10*6/uL (ref 3.70–5.45)
RDW: 13.8 % (ref 11.2–14.5)
WBC: 4.7 10*3/uL (ref 3.9–10.3)

## 2016-12-15 LAB — VITAMIN D 25 HYDROXY (VIT D DEFICIENCY, FRACTURES): Vitamin D, 25-Hydroxy: 37.2 ng/mL (ref 30.0–100.0)

## 2016-12-15 LAB — CANCER ANTIGEN 27.29: CA 27.29: 37.3 U/mL (ref 0.0–38.6)

## 2016-12-16 ENCOUNTER — Ambulatory Visit
Admission: RE | Admit: 2016-12-16 | Discharge: 2016-12-16 | Disposition: A | Discharge status: Home / Self Care | Payer: BLUE CROSS/BLUE SHIELD | Source: Ambulatory Visit | Attending: Hematology | Admitting: Hematology

## 2016-12-16 DIAGNOSIS — Z1231 Encounter for screening mammogram for malignant neoplasm of breast: Secondary | ICD-10-CM

## 2016-12-16 HISTORY — DX: Personal history of antineoplastic chemotherapy: Z92.21

## 2016-12-16 HISTORY — DX: Personal history of irradiation: Z92.3

## 2016-12-17 ENCOUNTER — Other Ambulatory Visit: Payer: Self-pay | Admitting: Hematology

## 2016-12-17 DIAGNOSIS — R928 Other abnormal and inconclusive findings on diagnostic imaging of breast: Secondary | ICD-10-CM

## 2016-12-21 ENCOUNTER — Telehealth: Payer: Self-pay | Admitting: Hematology

## 2016-12-21 NOTE — Telephone Encounter (Signed)
Faxed records to Dr Oda Cogan [phone]. Melanie the new pt coord. Will call pt with appt.

## 2017-01-06 DIAGNOSIS — C50212 Malignant neoplasm of upper-inner quadrant of left female breast: Secondary | ICD-10-CM | POA: Diagnosis not present

## 2017-01-11 ENCOUNTER — Other Ambulatory Visit: Payer: Self-pay | Admitting: Family Medicine

## 2017-01-20 ENCOUNTER — Ambulatory Visit (INDEPENDENT_AMBULATORY_CARE_PROVIDER_SITE_OTHER): Payer: BLUE CROSS/BLUE SHIELD | Admitting: Family Medicine

## 2017-01-20 ENCOUNTER — Encounter: Payer: Self-pay | Admitting: Family Medicine

## 2017-01-20 VITALS — BP 122/82 | HR 72 | Temp 98.2°F | Resp 16 | Ht 65.0 in | Wt 166.0 lb

## 2017-01-20 DIAGNOSIS — Z0000 Encounter for general adult medical examination without abnormal findings: Secondary | ICD-10-CM

## 2017-01-20 DIAGNOSIS — Z23 Encounter for immunization: Secondary | ICD-10-CM

## 2017-01-20 LAB — CBC WITH DIFFERENTIAL/PLATELET
BASOS ABS: 0 {cells}/uL (ref 0–200)
Basophils Relative: 0 %
EOS PCT: 2 %
Eosinophils Absolute: 100 cells/uL (ref 15–500)
HCT: 42.4 % (ref 35.0–45.0)
HEMOGLOBIN: 13.8 g/dL (ref 11.7–15.5)
LYMPHS PCT: 30 %
Lymphs Abs: 1500 cells/uL (ref 850–3900)
MCH: 29.7 pg (ref 27.0–33.0)
MCHC: 32.5 g/dL (ref 32.0–36.0)
MCV: 91.2 fL (ref 80.0–100.0)
MONOS PCT: 8 %
MPV: 9.6 fL (ref 7.5–12.5)
Monocytes Absolute: 400 cells/uL (ref 200–950)
NEUTROS ABS: 3000 {cells}/uL (ref 1500–7800)
NEUTROS PCT: 60 %
PLATELETS: 281 10*3/uL (ref 140–400)
RBC: 4.65 MIL/uL (ref 3.80–5.10)
RDW: 13.7 % (ref 11.0–15.0)
WBC: 5 10*3/uL (ref 3.8–10.8)

## 2017-01-20 NOTE — Patient Instructions (Signed)
Follow up in 6 months to recheck BP and cholesterol We'll notify you of your lab results and make any changes if needed Keep up the good work on healthy diet and regular exercise- you look great! Call with any questions or concerns Enjoy your dream house!!!

## 2017-01-20 NOTE — Assessment & Plan Note (Signed)
Pt's PE WNL.  UTD on colonoscopy, mammo, pap.  Due for repeat Tdap- given today.  Check labs.  Anticipatory guidance provided.

## 2017-01-20 NOTE — Progress Notes (Signed)
   Subjective:    Patient ID: Emily Griffith, female    DOB: [birth year], 61 y.o.   MRN: [id number]  HPI CPE- UTD on colonoscopy, mammo.  No concerns today.  Exercising regularly.   Review of Systems Patient reports no vision/ hearing changes, adenopathy,fever, weight change,  persistant/recurrent hoarseness , swallowing issues, chest pain, palpitations, edema, persistant/recurrent cough, hemoptysis, dyspnea (rest/exertional/paroxysmal nocturnal), gastrointestinal bleeding (melena, rectal bleeding), abdominal pain, significant heartburn, bowel changes, GU symptoms (dysuria, hematuria, incontinence), Gyn symptoms (abnormal  bleeding, pain),  syncope, focal weakness, memory loss, numbness & tingling, skin/hair/nail changes, abnormal bruising or bleeding, anxiety, or depression.     Objective:   Physical Exam General Appearance:    Alert, cooperative, no distress, appears stated age  Head:    Normocephalic, without obvious abnormality, atraumatic  Eyes:    PERRL, conjunctiva/corneas clear, EOM's intact, fundi    benign, both eyes  Ears:    Normal TM's and external ear canals, both ears  Nose:   Nares normal, septum midline, mucosa normal, no drainage    or sinus tenderness  Throat:   Lips, mucosa, and tongue normal; teeth and gums normal  Neck:   Supple, symmetrical, trachea midline, no adenopathy;    Thyroid: no enlargement/tenderness/nodules  Back:     Symmetric, no curvature, ROM normal, no CVA tenderness  Lungs:     Clear to auscultation bilaterally, respirations unlabored  Chest Wall:    No tenderness or deformity   Heart:    Regular rate and rhythm, S1 and S2 normal, no murmur, rub   or gallop  Breast Exam:    Deferred to mammo  Abdomen:     Soft, non-tender, bowel sounds active all four quadrants,    no masses, no organomegaly  Genitalia:    Deferred  Rectal:    Extremities:   Extremities normal, atraumatic, no cyanosis or edema  Pulses:   2+ and symmetric all extremities    Skin:   Skin color, texture, turgor normal, no rashes or lesions  Lymph nodes:   Cervical, supraclavicular, and axillary nodes normal  Neurologic:   CNII-XII intact, normal strength, sensation and reflexes    throughout          Assessment & Plan:

## 2017-01-20 NOTE — Progress Notes (Signed)
Pre visit review using our clinic review tool, if applicable. No additional management support is needed unless otherwise documented below in the visit note. 

## 2017-01-21 ENCOUNTER — Ambulatory Visit
Admission: RE | Admit: 2017-01-21 | Discharge: 2017-01-21 | Disposition: A | Discharge status: Home / Self Care | Payer: BLUE CROSS/BLUE SHIELD | Source: Ambulatory Visit | Attending: Hematology | Admitting: Hematology

## 2017-01-21 DIAGNOSIS — R928 Other abnormal and inconclusive findings on diagnostic imaging of breast: Secondary | ICD-10-CM

## 2017-01-21 LAB — BASIC METABOLIC PANEL
BUN: 12 mg/dL (ref 7–25)
CALCIUM: 9.8 mg/dL (ref 8.6–10.4)
CO2: 25 mmol/L (ref 20–31)
Chloride: 103 mmol/L (ref 98–110)
Creat: 0.67 mg/dL (ref 0.50–0.99)
GLUCOSE: 94 mg/dL (ref 65–99)
Potassium: 4.3 mmol/L (ref 3.5–5.3)
SODIUM: 141 mmol/L (ref 135–146)

## 2017-01-21 LAB — LIPID PANEL
CHOLESTEROL: 228 mg/dL — AB (ref <200)
HDL: 67 mg/dL (ref >50)
LDL Cholesterol: 141 mg/dL — ABNORMAL HIGH (ref <100)
TRIGLYCERIDES: 101 mg/dL (ref <150)
Total CHOL/HDL Ratio: 3.4 Ratio (ref <5.0)
VLDL: 20 mg/dL (ref <30)

## 2017-01-21 LAB — HEPATIC FUNCTION PANEL
ALBUMIN: 4.3 g/dL (ref 3.6–5.1)
ALK PHOS: 64 U/L (ref 33–130)
ALT: 17 U/L (ref 6–29)
AST: 19 U/L (ref 10–35)
Bilirubin, Direct: 0.1 mg/dL (ref <=0.2)
Indirect Bilirubin: 0.7 mg/dL (ref 0.2–1.2)
TOTAL PROTEIN: 7 g/dL (ref 6.1–8.1)
Total Bilirubin: 0.8 mg/dL (ref 0.2–1.2)

## 2017-02-17 IMAGING — MR MR ABDOMEN WO/W CM
10 of 19 series · 21 of 48 positions shown · IV contrast (Yes   MH)
Comparison: 07/11/2015 PET-CT.

CLINICAL DATA: Stage IIB invasive lobular left breast cancer status
post left mastectomy, with indeterminate focus of hypermetabolism in
the posterior lateral segment left liver lobe on recent PET-CT study
without unenhanced CT correlate.

EXAM:
MRI ABDOMEN WITHOUT AND WITH CONTRAST
TECHNIQUE: Multiplanar multisequence MR imaging of the abdomen was performed
both before and after the administration of intravenous contrast.
CONTRAST:  15mL MULTIHANCE GADOBENATE DIMEGLUMINE 529 MG/ML IV SOLN

[Series 4: cor ssfse nav · coronal · 6.0mm · 0.78mm/px · 1 of 33 slices shown]
[im 1/33]
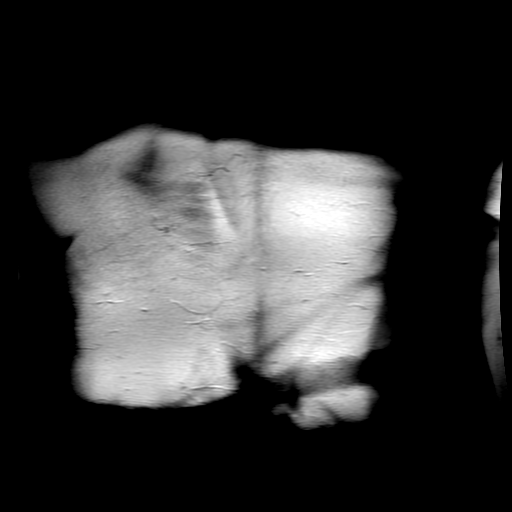

[Series 7: ax ssfse nav · axial · 6.0mm · 0.74mm/px · 1 of 36 slices shown]
[im 1/36]
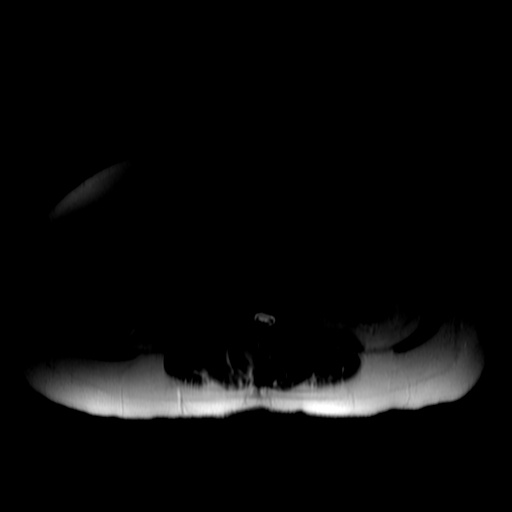

[Series 8: T2 fat-sat · axial · 6.0mm · 0.74mm/px · 1 of 31 slices shown]
[im 1/31]
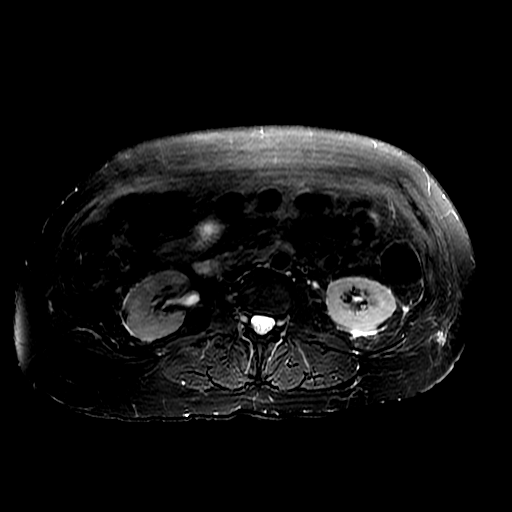

[Series 9: DWI b500 · axial · 8.0mm · 1.48mm/px · z∈[-86,+146]mm · 3 of 90 slices shown]
[im 1/90]
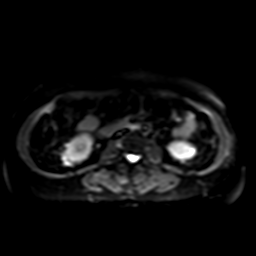
[im 45/90]
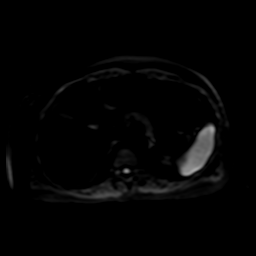
[im 90/90]
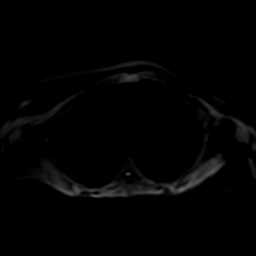

[Series 10: T1 dynamic · axial · 4.0mm · 0.78mm/px · z∈[-77,+151]mm · 2 of 58 slices shown (1 of 3)]
[im 1/58]
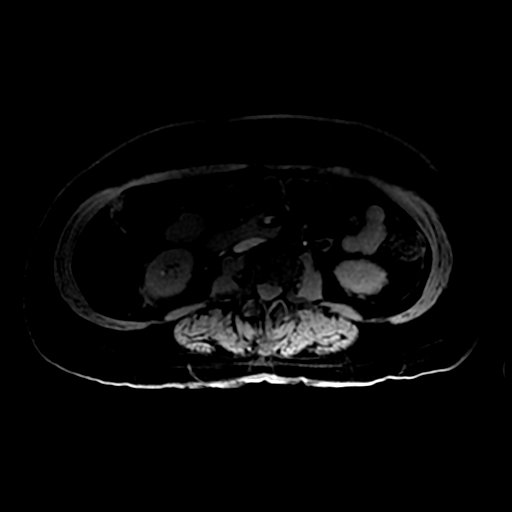
[im 58/58]
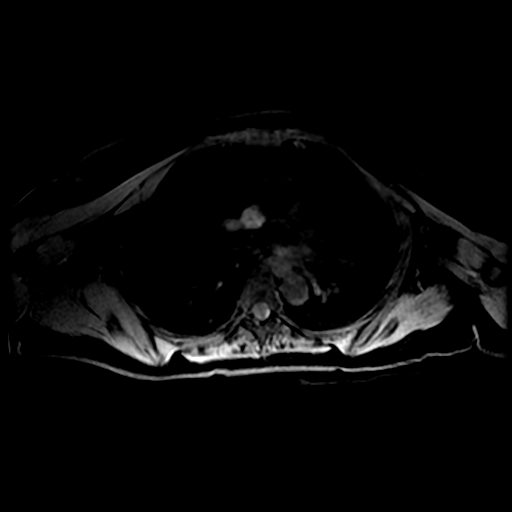

[Series 900: DWI · axial · 8.0mm · 1.48mm/px · 1 of 30 slices shown]
[im 1/30]
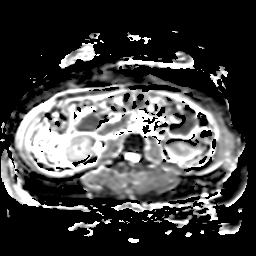

[Series 1001: T1 dynamic · axial · 4.0mm · 0.78mm/px · z∈[-77,+151]mm · 3 of 58 slices shown (2 of 3)]
[im 1/58]
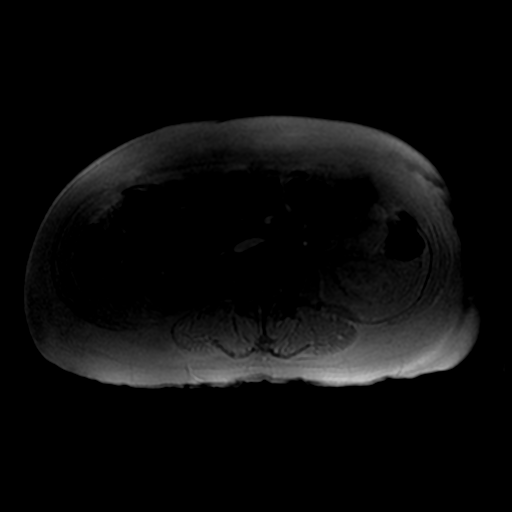
[im 29/58]
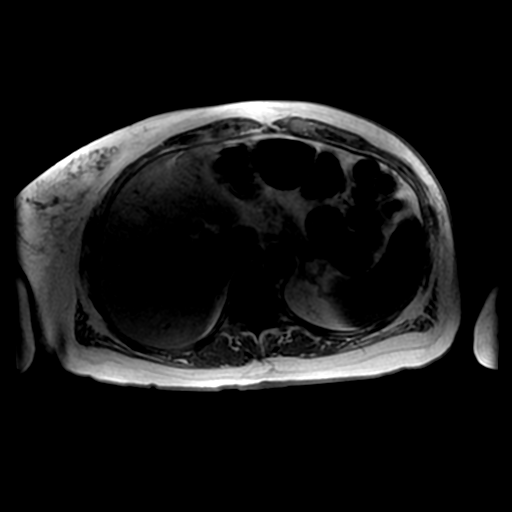
[im 58/58]
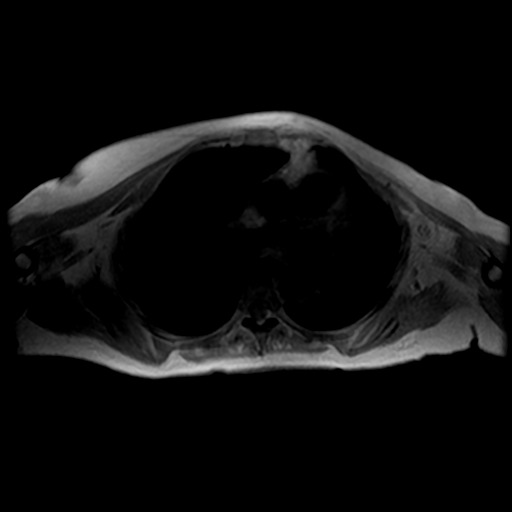

[Series 1002: T1 dynamic · axial · 4.0mm · 0.78mm/px · z∈[-77,+151]mm · 3 of 58 slices shown (3 of 3)]
[im 1/58]
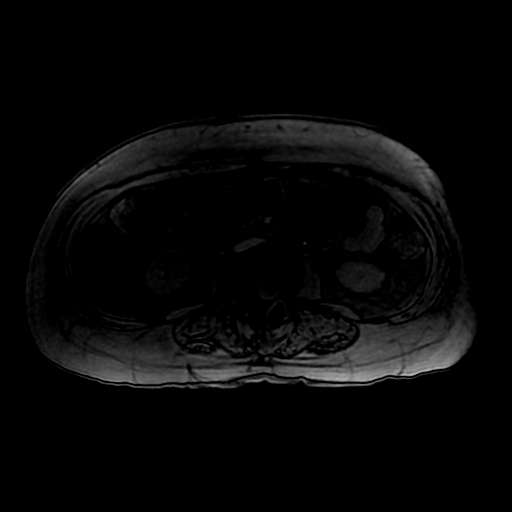
[im 29/58]
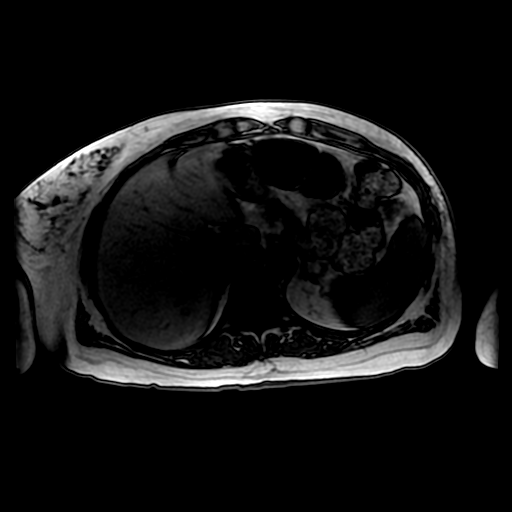
[im 58/58]
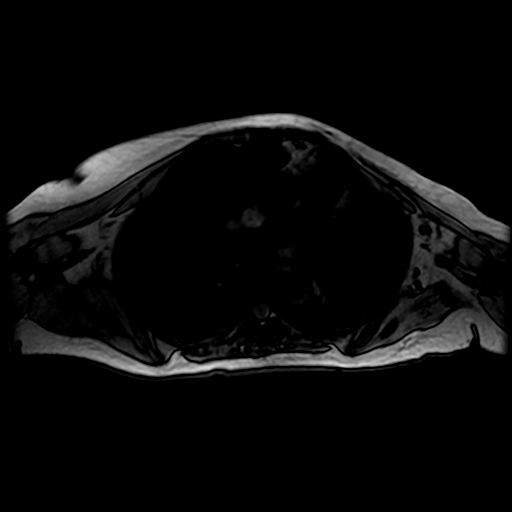

[Series 1100: T1 dynamic post-contrast · axial · non-contrast · 4.0mm · 0.78mm/px · z∈[-77,+151]mm · 3 of 58 slices shown (1 of 2)]
[im 1/58]
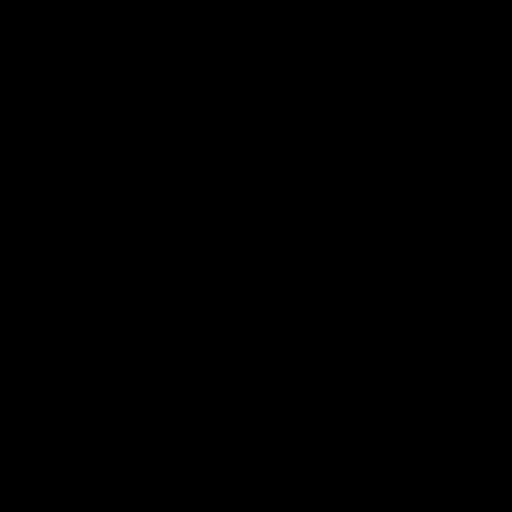
[im 29/58]
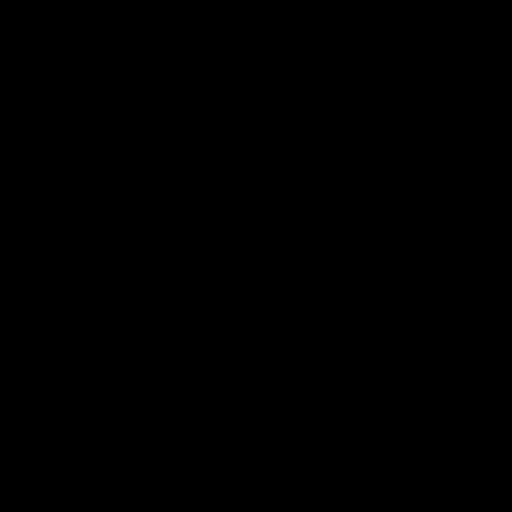
[im 58/58]
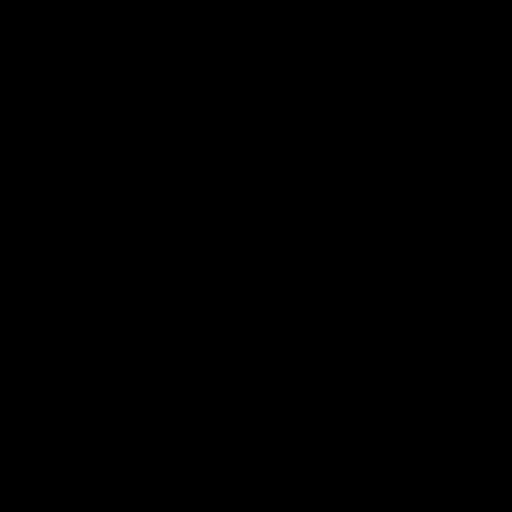

[Series 1101: T1 dynamic post-contrast · axial · non-contrast · 4.0mm · 0.78mm/px · z∈[-77,+151]mm · 3 of 58 slices shown (2 of 2)]
[im 1/58]
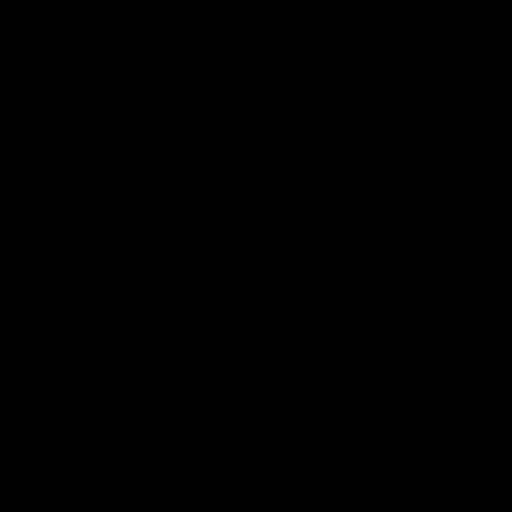
[im 29/58]
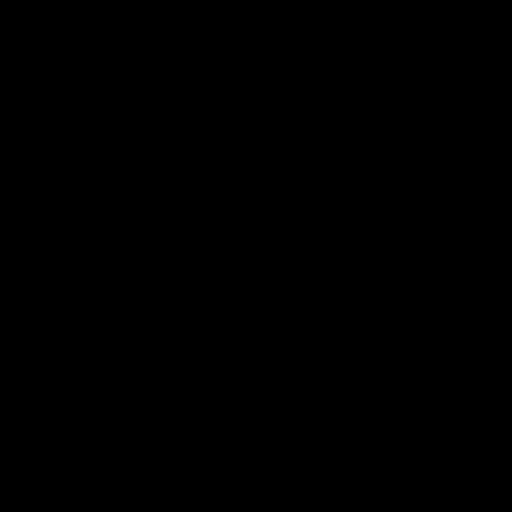
[im 58/58]
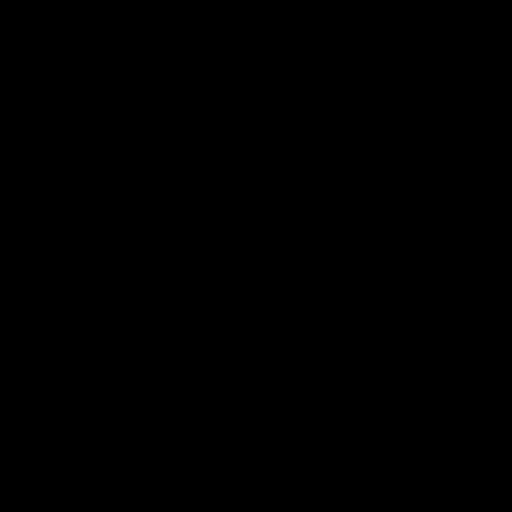

[21 of 48 positions shown; findings below may reference images not displayed]

FINDINGS: Lower chest: Clear lung bases.

Hepatobiliary: Normal liver size and configuration. No hepatic
steatosis. There are three subcentimeter simple liver cysts in the
right liver lobe (series 7/ image 9, [DATE] and 7/ 29). There are no
liver masses. Normal gallbladder with no cholelithiasis. No biliary
ductal dilatation. No choledocholithiasis. Common bile duct diameter
5 mm.

Pancreas: No pancreatic mass or duct dilation.  No pancreas divisum.

Spleen: Normal size. No mass.

Adrenals/Urinary Tract: Normal adrenals. No hydronephrosis. No renal
masses. Small parapelvic renal cysts in the mid left renal sinus.

Stomach/Bowel: Small to moderate hiatal hernia. Otherwise grossly
normal stomach. Visualized small and large bowel is normal caliber,
with no bowel wall thickening.

Vascular/Lymphatic: Normal caliber abdominal aorta. Patent portal,
splenic, hepatic and renal veins. No pathologically enlarged lymph
nodes in the abdomen.

Other: No abdominal ascites or focal fluid collection.

Musculoskeletal: No aggressive appearing focal osseous lesions.
Scattered Tarlov cysts are seen in the right thoracic spine
foramina. Status post left mastectomy.
IMPRESSION: 1. Tiny simple liver cysts. No suspicious liver masses.
Specifically, no lateral segment left liver lobe mass is seen to
account for the focus of hypermetabolism in the lateral segment left
liver lobe on the 07/11/2015 PET-CT study.
2. No evidence of metastatic disease in the abdomen.
3. Small to moderate hiatal hernia.

## 2017-05-02 DIAGNOSIS — C50212 Malignant neoplasm of upper-inner quadrant of left female breast: Secondary | ICD-10-CM | POA: Diagnosis not present

## 2017-05-10 DIAGNOSIS — C50212 Malignant neoplasm of upper-inner quadrant of left female breast: Secondary | ICD-10-CM | POA: Diagnosis not present

## 2017-06-26 ENCOUNTER — Encounter: Payer: Self-pay | Admitting: Family Medicine

## 2017-06-27 MED ORDER — DICYCLOMINE HCL 20 MG PO TABS: 20.0000 mg | ORAL_TABLET | Freq: Four times a day (QID) | ORAL | 6 refills | Status: DC

## 2017-08-01 ENCOUNTER — Other Ambulatory Visit: Payer: Self-pay | Admitting: Family Medicine

## 2017-08-08 ENCOUNTER — Ambulatory Visit: Payer: BLUE CROSS/BLUE SHIELD | Admitting: Family Medicine

## 2017-08-08 ENCOUNTER — Encounter: Payer: Self-pay | Admitting: Family Medicine

## 2017-08-08 ENCOUNTER — Encounter: Payer: Self-pay | Admitting: General Practice

## 2017-08-08 VITALS — BP 123/81 | HR 76 | Temp 98.1°F | Resp 16 | Ht 65.0 in | Wt 159.1 lb

## 2017-08-08 DIAGNOSIS — I10 Essential (primary) hypertension: Secondary | ICD-10-CM | POA: Diagnosis not present

## 2017-08-08 DIAGNOSIS — E785 Hyperlipidemia, unspecified: Secondary | ICD-10-CM

## 2017-08-08 DIAGNOSIS — Z170 Estrogen receptor positive status [ER+]: Secondary | ICD-10-CM

## 2017-08-08 DIAGNOSIS — C50212 Malignant neoplasm of upper-inner quadrant of left female breast: Secondary | ICD-10-CM | POA: Diagnosis not present

## 2017-08-08 LAB — CBC WITH DIFFERENTIAL/PLATELET
BASOS ABS: 0 10*3/uL (ref 0.0–0.1)
Basophils Relative: 0.4 % (ref 0.0–3.0)
EOS PCT: 1.5 % (ref 0.0–5.0)
Eosinophils Absolute: 0.1 10*3/uL (ref 0.0–0.7)
HEMATOCRIT: 41.5 % (ref 36.0–46.0)
Hemoglobin: 13.9 g/dL (ref 12.0–15.0)
LYMPHS PCT: 19.3 % (ref 12.0–46.0)
Lymphs Abs: 1 10*3/uL (ref 0.7–4.0)
MCHC: 33.5 g/dL (ref 30.0–36.0)
MCV: 94.1 fl (ref 78.0–100.0)
MONOS PCT: 5.2 % (ref 3.0–12.0)
Monocytes Absolute: 0.3 10*3/uL (ref 0.1–1.0)
NEUTROS ABS: 4 10*3/uL (ref 1.4–7.7)
Neutrophils Relative %: 73.6 % (ref 43.0–77.0)
Platelets: 298 10*3/uL (ref 150.0–400.0)
RBC: 4.42 Mil/uL (ref 3.87–5.11)
RDW: 13.7 % (ref 11.5–15.5)
WBC: 5.4 10*3/uL (ref 4.0–10.5)

## 2017-08-08 LAB — BASIC METABOLIC PANEL
BUN: 12 mg/dL (ref 6–23)
CALCIUM: 9.7 mg/dL (ref 8.4–10.5)
CO2: 29 mEq/L (ref 19–32)
Chloride: 100 mEq/L (ref 96–112)
Creatinine, Ser: 0.6 mg/dL (ref 0.40–1.20)
GFR: 107.39 mL/min (ref >60.00)
GLUCOSE: 95 mg/dL (ref 70–99)
POTASSIUM: 3.5 meq/L (ref 3.5–5.1)
SODIUM: 139 meq/L (ref 135–145)

## 2017-08-08 LAB — LIPID PANEL
CHOL/HDL RATIO: 4
CHOLESTEROL: 196 mg/dL (ref 0–200)
HDL: 51.8 mg/dL (ref >39.00)
LDL CALC: 129 mg/dL — AB (ref 0–99)
NonHDL: 144.26
Triglycerides: 76 mg/dL (ref 0.0–149.0)
VLDL: 15.2 mg/dL (ref 0.0–40.0)

## 2017-08-08 LAB — HEPATIC FUNCTION PANEL
ALK PHOS: 70 U/L (ref 39–117)
ALT: 17 U/L (ref 0–35)
AST: 15 U/L (ref 0–37)
Albumin: 4.2 g/dL (ref 3.5–5.2)
BILIRUBIN DIRECT: 0.2 mg/dL (ref 0.0–0.3)
TOTAL PROTEIN: 6.7 g/dL (ref 6.0–8.3)
Total Bilirubin: 0.9 mg/dL (ref 0.2–1.2)

## 2017-08-08 LAB — TSH: TSH: 0.88 u[IU]/mL (ref 0.35–4.50)

## 2017-08-08 MED ORDER — LISINOPRIL-HYDROCHLOROTHIAZIDE 20-12.5 MG PO TABS: 1.0000 | ORAL_TABLET | Freq: Every day | ORAL | 1 refills | Status: AC

## 2017-08-08 NOTE — Assessment & Plan Note (Signed)
Chronic problem.  Well controlled today.  Asymptomatic.  Check labs.  No anticipated med changes.  Will follow. 

## 2017-08-08 NOTE — Assessment & Plan Note (Signed)
Chronic problem.  Tolerating statin w/o difficulty.  Very conscientious about diet and exercise.  Check labs.  Adjust meds prn

## 2017-08-08 NOTE — Assessment & Plan Note (Signed)
Currently following w/ Oncology in [facility].  Letrozole was d/c'd and pt now takes Aromasin.

## 2017-08-08 NOTE — Progress Notes (Signed)
   Subjective:    Patient ID: Emily Griffith, female    DOB: June 30, 1956, 61 y.o.   MRN: [id number]  HPI HTN- chronic problem, on Lisinopril-HCTZ w/ good control.  No CP, SOB, HAs, visual changes, edema.  Hyperlipidemia- chronic problem, on Lipitor 40mg  daily.  Pt has lost 7 lbs since last visit but did recently spend 2.5 weeks in [place] on a cooking tour.  Exercising regularly.  No abd pain, N/V, myalgias.  UTD on flu shot   Review of Systems For ROS see HPI     Objective:   Physical Exam  Constitutional: She is oriented to person, place, and time. She appears well-developed and well-nourished. No distress.  HENT:  Head: Normocephalic and atraumatic.  Eyes: Conjunctivae and EOM are normal. Pupils are equal, round, and reactive to light.  Neck: Normal range of motion. Neck supple. No thyromegaly present.  Cardiovascular: Normal rate, regular rhythm, normal heart sounds and intact distal pulses.  No murmur heard. Pulmonary/Chest: Effort normal and breath sounds normal. No respiratory distress.  Abdominal: Soft. She exhibits no distension. There is no tenderness.  Musculoskeletal: She exhibits no edema.  Lymphadenopathy:    She has no cervical adenopathy.  Neurological: She is alert and oriented to person, place, and time.  Skin: Skin is warm and dry.  Psychiatric: She has a normal mood and affect. Her behavior is normal.  Vitals reviewed.         Assessment & Plan:

## 2017-08-08 NOTE — Patient Instructions (Signed)
Schedule your complete physical in 6 months We'll notify you of your lab results and make any changes if needed Keep up the good work!  You look fantastic!!! Call with any questions or concerns Happy Fall!!!

## 2017-10-17 ENCOUNTER — Encounter: Payer: Self-pay | Admitting: Family Medicine

## 2017-10-17 MED ORDER — DICYCLOMINE HCL 20 MG PO TABS: 20.0000 mg | ORAL_TABLET | Freq: Four times a day (QID) | ORAL | 1 refills | Status: AC

## 2017-11-10 DIAGNOSIS — C50212 Malignant neoplasm of upper-inner quadrant of left female breast: Secondary | ICD-10-CM | POA: Diagnosis not present

## 2017-11-17 DIAGNOSIS — C50212 Malignant neoplasm of upper-inner quadrant of left female breast: Secondary | ICD-10-CM | POA: Diagnosis not present

## 2017-11-21 ENCOUNTER — Encounter: Payer: Self-pay | Admitting: Family Medicine

## 2018-01-05 DIAGNOSIS — Z853 Personal history of malignant neoplasm of breast: Secondary | ICD-10-CM | POA: Diagnosis not present

## 2018-01-05 DIAGNOSIS — N641 Fat necrosis of breast: Secondary | ICD-10-CM | POA: Diagnosis not present

## 2018-01-05 DIAGNOSIS — R921 Mammographic calcification found on diagnostic imaging of breast: Secondary | ICD-10-CM | POA: Diagnosis not present

## 2018-01-05 DIAGNOSIS — C50212 Malignant neoplasm of upper-inner quadrant of left female breast: Secondary | ICD-10-CM | POA: Diagnosis not present

## 2018-01-12 DIAGNOSIS — H2513 Age-related nuclear cataract, bilateral: Secondary | ICD-10-CM | POA: Diagnosis not present

## 2018-01-31 DIAGNOSIS — E785 Hyperlipidemia, unspecified: Secondary | ICD-10-CM | POA: Diagnosis not present

## 2018-01-31 DIAGNOSIS — Z0000 Encounter for general adult medical examination without abnormal findings: Secondary | ICD-10-CM | POA: Diagnosis not present

## 2018-02-02 ENCOUNTER — Other Ambulatory Visit: Payer: Self-pay | Admitting: Family Medicine

## 2018-02-10 ENCOUNTER — Encounter: Payer: BLUE CROSS/BLUE SHIELD | Admitting: Family Medicine

## 2018-02-13 DIAGNOSIS — Z124 Encounter for screening for malignant neoplasm of cervix: Secondary | ICD-10-CM | POA: Diagnosis not present

## 2018-02-13 DIAGNOSIS — M8580 Other specified disorders of bone density and structure, unspecified site: Secondary | ICD-10-CM | POA: Diagnosis not present

## 2018-02-13 DIAGNOSIS — K589 Irritable bowel syndrome without diarrhea: Secondary | ICD-10-CM | POA: Diagnosis not present

## 2018-02-13 DIAGNOSIS — E785 Hyperlipidemia, unspecified: Secondary | ICD-10-CM | POA: Diagnosis not present

## 2018-02-13 DIAGNOSIS — Z0000 Encounter for general adult medical examination without abnormal findings: Secondary | ICD-10-CM | POA: Diagnosis not present

## 2018-05-08 DIAGNOSIS — C50212 Malignant neoplasm of upper-inner quadrant of left female breast: Secondary | ICD-10-CM | POA: Diagnosis not present

## 2018-05-18 DIAGNOSIS — C50212 Malignant neoplasm of upper-inner quadrant of left female breast: Secondary | ICD-10-CM | POA: Diagnosis not present

## 2018-06-23 DIAGNOSIS — M8589 Other specified disorders of bone density and structure, multiple sites: Secondary | ICD-10-CM | POA: Diagnosis not present

## 2018-06-23 DIAGNOSIS — M8588 Other specified disorders of bone density and structure, other site: Secondary | ICD-10-CM | POA: Diagnosis not present

## 2018-08-15 DIAGNOSIS — R7309 Other abnormal glucose: Secondary | ICD-10-CM | POA: Diagnosis not present

## 2018-08-15 DIAGNOSIS — E785 Hyperlipidemia, unspecified: Secondary | ICD-10-CM | POA: Diagnosis not present

## 2018-08-22 DIAGNOSIS — M8580 Other specified disorders of bone density and structure, unspecified site: Secondary | ICD-10-CM | POA: Diagnosis not present

## 2018-08-22 DIAGNOSIS — K589 Irritable bowel syndrome without diarrhea: Secondary | ICD-10-CM | POA: Diagnosis not present

## 2018-08-22 DIAGNOSIS — E785 Hyperlipidemia, unspecified: Secondary | ICD-10-CM | POA: Diagnosis not present

## 2018-08-22 DIAGNOSIS — I10 Essential (primary) hypertension: Secondary | ICD-10-CM | POA: Diagnosis not present

## 2018-09-07 DIAGNOSIS — C50212 Malignant neoplasm of upper-inner quadrant of left female breast: Secondary | ICD-10-CM | POA: Diagnosis not present

## 2018-09-16 DIAGNOSIS — H5210 Myopia, unspecified eye: Secondary | ICD-10-CM | POA: Diagnosis not present

## 2018-11-09 DIAGNOSIS — C50212 Malignant neoplasm of upper-inner quadrant of left female breast: Secondary | ICD-10-CM | POA: Diagnosis not present

## 2018-11-16 DIAGNOSIS — C50212 Malignant neoplasm of upper-inner quadrant of left female breast: Secondary | ICD-10-CM | POA: Diagnosis not present

## 2019-01-08 DIAGNOSIS — R921 Mammographic calcification found on diagnostic imaging of breast: Secondary | ICD-10-CM | POA: Diagnosis not present

## 2019-01-08 DIAGNOSIS — C50212 Malignant neoplasm of upper-inner quadrant of left female breast: Secondary | ICD-10-CM | POA: Diagnosis not present

## 2019-01-08 DIAGNOSIS — Z9012 Acquired absence of left breast and nipple: Secondary | ICD-10-CM | POA: Diagnosis not present

## 2019-03-26 DIAGNOSIS — M25561 Pain in right knee: Secondary | ICD-10-CM | POA: Diagnosis not present

## 2019-04-09 DIAGNOSIS — M25561 Pain in right knee: Secondary | ICD-10-CM | POA: Diagnosis not present

## 2019-04-09 DIAGNOSIS — M2241 Chondromalacia patellae, right knee: Secondary | ICD-10-CM | POA: Diagnosis not present

## 2019-04-09 DIAGNOSIS — M25461 Effusion, right knee: Secondary | ICD-10-CM | POA: Diagnosis not present

## 2019-04-12 DIAGNOSIS — M170 Bilateral primary osteoarthritis of knee: Secondary | ICD-10-CM | POA: Diagnosis not present

## 2019-04-25 DIAGNOSIS — M179 Osteoarthritis of knee, unspecified: Secondary | ICD-10-CM | POA: Diagnosis not present

## 2019-05-01 DIAGNOSIS — M179 Osteoarthritis of knee, unspecified: Secondary | ICD-10-CM | POA: Diagnosis not present

## 2019-05-04 DIAGNOSIS — M179 Osteoarthritis of knee, unspecified: Secondary | ICD-10-CM | POA: Diagnosis not present

## 2019-05-08 DIAGNOSIS — M179 Osteoarthritis of knee, unspecified: Secondary | ICD-10-CM | POA: Diagnosis not present

## 2019-05-09 DIAGNOSIS — N390 Urinary tract infection, site not specified: Secondary | ICD-10-CM | POA: Diagnosis not present

## 2019-05-09 DIAGNOSIS — E785 Hyperlipidemia, unspecified: Secondary | ICD-10-CM | POA: Diagnosis not present

## 2019-05-09 DIAGNOSIS — C50212 Malignant neoplasm of upper-inner quadrant of left female breast: Secondary | ICD-10-CM | POA: Diagnosis not present

## 2019-05-10 DIAGNOSIS — M179 Osteoarthritis of knee, unspecified: Secondary | ICD-10-CM | POA: Diagnosis not present

## 2019-05-15 DIAGNOSIS — M179 Osteoarthritis of knee, unspecified: Secondary | ICD-10-CM | POA: Diagnosis not present

## 2019-05-17 DIAGNOSIS — M179 Osteoarthritis of knee, unspecified: Secondary | ICD-10-CM | POA: Diagnosis not present

## 2019-05-21 DIAGNOSIS — M179 Osteoarthritis of knee, unspecified: Secondary | ICD-10-CM | POA: Diagnosis not present

## 2019-05-22 DIAGNOSIS — E785 Hyperlipidemia, unspecified: Secondary | ICD-10-CM | POA: Diagnosis not present

## 2019-05-22 DIAGNOSIS — Z853 Personal history of malignant neoplasm of breast: Secondary | ICD-10-CM | POA: Diagnosis not present

## 2019-05-22 DIAGNOSIS — I10 Essential (primary) hypertension: Secondary | ICD-10-CM | POA: Diagnosis not present

## 2019-05-22 DIAGNOSIS — C50212 Malignant neoplasm of upper-inner quadrant of left female breast: Secondary | ICD-10-CM | POA: Diagnosis not present

## 2019-05-22 DIAGNOSIS — K589 Irritable bowel syndrome without diarrhea: Secondary | ICD-10-CM | POA: Diagnosis not present

## 2019-05-24 DIAGNOSIS — M179 Osteoarthritis of knee, unspecified: Secondary | ICD-10-CM | POA: Diagnosis not present

## 2019-05-29 DIAGNOSIS — M179 Osteoarthritis of knee, unspecified: Secondary | ICD-10-CM | POA: Diagnosis not present

## 2019-05-31 DIAGNOSIS — M179 Osteoarthritis of knee, unspecified: Secondary | ICD-10-CM | POA: Diagnosis not present

## 2019-06-07 DIAGNOSIS — I10 Essential (primary) hypertension: Secondary | ICD-10-CM | POA: Diagnosis not present

## 2019-06-07 DIAGNOSIS — E785 Hyperlipidemia, unspecified: Secondary | ICD-10-CM | POA: Diagnosis not present

## 2019-06-07 DIAGNOSIS — Z79899 Other long term (current) drug therapy: Secondary | ICD-10-CM | POA: Diagnosis not present

## 2019-06-07 DIAGNOSIS — Z01818 Encounter for other preprocedural examination: Secondary | ICD-10-CM | POA: Diagnosis not present

## 2019-06-07 DIAGNOSIS — K449 Diaphragmatic hernia without obstruction or gangrene: Secondary | ICD-10-CM | POA: Diagnosis not present

## 2019-06-07 DIAGNOSIS — E739 Lactose intolerance, unspecified: Secondary | ICD-10-CM | POA: Diagnosis not present

## 2019-06-07 DIAGNOSIS — Z7982 Long term (current) use of aspirin: Secondary | ICD-10-CM | POA: Diagnosis not present

## 2019-06-07 DIAGNOSIS — M1711 Unilateral primary osteoarthritis, right knee: Secondary | ICD-10-CM | POA: Diagnosis not present

## 2019-06-07 DIAGNOSIS — Z20828 Contact with and (suspected) exposure to other viral communicable diseases: Secondary | ICD-10-CM | POA: Diagnosis not present

## 2019-06-14 DIAGNOSIS — I10 Essential (primary) hypertension: Secondary | ICD-10-CM | POA: Diagnosis not present

## 2019-06-14 DIAGNOSIS — E739 Lactose intolerance, unspecified: Secondary | ICD-10-CM | POA: Diagnosis not present

## 2019-06-14 DIAGNOSIS — Z79899 Other long term (current) drug therapy: Secondary | ICD-10-CM | POA: Diagnosis not present

## 2019-06-14 DIAGNOSIS — Z7982 Long term (current) use of aspirin: Secondary | ICD-10-CM | POA: Diagnosis not present

## 2019-06-14 DIAGNOSIS — G8918 Other acute postprocedural pain: Secondary | ICD-10-CM | POA: Diagnosis not present

## 2019-06-14 DIAGNOSIS — Z20828 Contact with and (suspected) exposure to other viral communicable diseases: Secondary | ICD-10-CM | POA: Diagnosis not present

## 2019-06-14 DIAGNOSIS — E785 Hyperlipidemia, unspecified: Secondary | ICD-10-CM | POA: Diagnosis not present

## 2019-06-14 DIAGNOSIS — M1711 Unilateral primary osteoarthritis, right knee: Secondary | ICD-10-CM | POA: Diagnosis not present

## 2019-06-15 DIAGNOSIS — I10 Essential (primary) hypertension: Secondary | ICD-10-CM | POA: Diagnosis not present

## 2019-06-15 DIAGNOSIS — Z20828 Contact with and (suspected) exposure to other viral communicable diseases: Secondary | ICD-10-CM | POA: Diagnosis not present

## 2019-06-15 DIAGNOSIS — E739 Lactose intolerance, unspecified: Secondary | ICD-10-CM | POA: Diagnosis not present

## 2019-06-15 DIAGNOSIS — Z7982 Long term (current) use of aspirin: Secondary | ICD-10-CM | POA: Diagnosis not present

## 2019-06-15 DIAGNOSIS — E785 Hyperlipidemia, unspecified: Secondary | ICD-10-CM | POA: Diagnosis not present

## 2019-06-15 DIAGNOSIS — M1711 Unilateral primary osteoarthritis, right knee: Secondary | ICD-10-CM | POA: Diagnosis not present

## 2019-06-15 DIAGNOSIS — Z79899 Other long term (current) drug therapy: Secondary | ICD-10-CM | POA: Diagnosis not present

## 2019-06-19 DIAGNOSIS — M179 Osteoarthritis of knee, unspecified: Secondary | ICD-10-CM | POA: Diagnosis not present

## 2019-06-21 DIAGNOSIS — M179 Osteoarthritis of knee, unspecified: Secondary | ICD-10-CM | POA: Diagnosis not present

## 2019-06-25 DIAGNOSIS — Z96651 Presence of right artificial knee joint: Secondary | ICD-10-CM | POA: Diagnosis not present

## 2019-06-26 DIAGNOSIS — M179 Osteoarthritis of knee, unspecified: Secondary | ICD-10-CM | POA: Diagnosis not present

## 2019-06-28 DIAGNOSIS — M179 Osteoarthritis of knee, unspecified: Secondary | ICD-10-CM | POA: Diagnosis not present

## 2019-07-03 DIAGNOSIS — M179 Osteoarthritis of knee, unspecified: Secondary | ICD-10-CM | POA: Diagnosis not present

## 2019-07-05 DIAGNOSIS — M179 Osteoarthritis of knee, unspecified: Secondary | ICD-10-CM | POA: Diagnosis not present

## 2019-07-10 DIAGNOSIS — M179 Osteoarthritis of knee, unspecified: Secondary | ICD-10-CM | POA: Diagnosis not present

## 2019-07-12 DIAGNOSIS — M179 Osteoarthritis of knee, unspecified: Secondary | ICD-10-CM | POA: Diagnosis not present

## 2019-07-17 DIAGNOSIS — M179 Osteoarthritis of knee, unspecified: Secondary | ICD-10-CM | POA: Diagnosis not present

## 2019-07-19 DIAGNOSIS — M179 Osteoarthritis of knee, unspecified: Secondary | ICD-10-CM | POA: Diagnosis not present

## 2019-07-24 DIAGNOSIS — M179 Osteoarthritis of knee, unspecified: Secondary | ICD-10-CM | POA: Diagnosis not present

## 2019-07-26 DIAGNOSIS — M179 Osteoarthritis of knee, unspecified: Secondary | ICD-10-CM | POA: Diagnosis not present

## 2019-07-30 DIAGNOSIS — M179 Osteoarthritis of knee, unspecified: Secondary | ICD-10-CM | POA: Diagnosis not present

## 2019-07-31 DIAGNOSIS — H2513 Age-related nuclear cataract, bilateral: Secondary | ICD-10-CM | POA: Diagnosis not present

## 2019-07-31 DIAGNOSIS — H35433 Paving stone degeneration of retina, bilateral: Secondary | ICD-10-CM | POA: Diagnosis not present

## 2019-08-01 DIAGNOSIS — M179 Osteoarthritis of knee, unspecified: Secondary | ICD-10-CM | POA: Diagnosis not present

## 2019-08-07 DIAGNOSIS — M179 Osteoarthritis of knee, unspecified: Secondary | ICD-10-CM | POA: Diagnosis not present

## 2019-08-09 DIAGNOSIS — M179 Osteoarthritis of knee, unspecified: Secondary | ICD-10-CM | POA: Diagnosis not present

## 2019-08-14 DIAGNOSIS — M179 Osteoarthritis of knee, unspecified: Secondary | ICD-10-CM | POA: Diagnosis not present

## 2019-08-16 DIAGNOSIS — M179 Osteoarthritis of knee, unspecified: Secondary | ICD-10-CM | POA: Diagnosis not present

## 2019-08-21 DIAGNOSIS — M179 Osteoarthritis of knee, unspecified: Secondary | ICD-10-CM | POA: Diagnosis not present

## 2021-12-14 ENCOUNTER — Encounter: Payer: Self-pay | Admitting: Gastroenterology
